# Patient Record
Sex: Female | Born: 2000
Health system: Southern US, Community
[De-identification: ages and names within clinical notes are randomized; demographics above are authoritative.]

## PROBLEM LIST (undated history)

## (undated) DIAGNOSIS — I10 Essential (primary) hypertension: Secondary | ICD-10-CM

## (undated) DIAGNOSIS — D649 Anemia, unspecified: Secondary | ICD-10-CM

## (undated) DIAGNOSIS — K760 Fatty (change of) liver, not elsewhere classified: Secondary | ICD-10-CM

## (undated) DIAGNOSIS — R7303 Prediabetes: Secondary | ICD-10-CM

## (undated) DIAGNOSIS — E282 Polycystic ovarian syndrome: Secondary | ICD-10-CM

## (undated) DIAGNOSIS — J302 Other seasonal allergic rhinitis: Secondary | ICD-10-CM

## (undated) DIAGNOSIS — J45909 Unspecified asthma, uncomplicated: Secondary | ICD-10-CM

## (undated) DIAGNOSIS — K219 Gastro-esophageal reflux disease without esophagitis: Secondary | ICD-10-CM

## (undated) DIAGNOSIS — E7800 Pure hypercholesterolemia, unspecified: Secondary | ICD-10-CM

## (undated) DIAGNOSIS — N939 Abnormal uterine and vaginal bleeding, unspecified: Secondary | ICD-10-CM

## (undated) DIAGNOSIS — L600 Ingrowing nail: Secondary | ICD-10-CM

## (undated) HISTORY — DX: Anemia, unspecified: D64.9

## (undated) HISTORY — DX: Prediabetes: R73.03

## (undated) HISTORY — DX: Essential (primary) hypertension: I10

## (undated) HISTORY — DX: Pure hypercholesterolemia, unspecified: E78.00

## (undated) HISTORY — DX: Fatty (change of) liver, not elsewhere classified: K76.0

## (undated) HISTORY — DX: Unspecified asthma, uncomplicated: J45.909

## (undated) HISTORY — DX: Abnormal uterine and vaginal bleeding, unspecified: N93.9

## (undated) HISTORY — DX: Polycystic ovarian syndrome: E28.2

## (undated) HISTORY — DX: Gastro-esophageal reflux disease without esophagitis: K21.9

---
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
## 2020-01-14 NOTE — Progress Notes (Addendum)
19 y.o. G0P0000 Single White or Caucasian female here for annual exam.  Working at [employer] right now.  She's thinking about looking for something else.  Pt is on OCPs for cycle control.  Flow lasts about 7 days.  She has heavy flow for two days.    She's noticed a rash in her groin.  She's used hydrocortisone cream and this make her feel a burning sensation.  She is now just using cornstarch.      Patient's last menstrual period was 12/23/2019 (exact date).          Sexually active: No. never The current method of family planning is OCP (estrogen/progesterone).    Exercising: Yes.    walking, bike, jumping jacks Smoker:  no  Health Maintenance: Pap:  none MMG: none TDaP:  2015 Screening Labs: not indicated today.  Is establishing care with PCP.  Has aged out of pediatricians office.     reports that she has never smoked. She has never used smokeless tobacco. She reports that she does not drink alcohol and does not use drugs.  Past Medical History:  Diagnosis Date  . Abnormal uterine bleeding   . Acid reflux   . Ingrown left big toenail 09/2016   recurrent  . PCOS (polycystic ovarian syndrome)   . Seasonal allergies     Past Surgical History:  Procedure Laterality Date  . LESION EXCISION Left 07/11/2014   Procedure: EXCISION OF BENIGN LESIONS INCLUDING MARGINS ;  Surgeon: Gerald Stabs, MD;  Location: [place];  Service: Pediatrics;  Laterality: Left;  mid back  and right neck  . TOENAIL EXCISION  01/27/2012   Procedure: MINOR TOENAIL EXCISION;  Surgeon: Jerilynn Mages. Gerald Stabs, MD;  Location: [place];  Service: Pediatrics;  Laterality: Left;  . TOENAIL EXCISION  03/23/2012   Procedure: MINOR TOENAIL EXCISION;  Surgeon: Jerilynn Mages. Gerald Stabs, MD;  Location: [place];  Service: Pediatrics;  Laterality: Right;  . TOENAIL EXCISION Right 09/21/2012   Procedure: Partial Excision of Right Toenail;  Surgeon: Jerilynn Mages. Gerald Stabs, MD;  Location:  [place];  Service: Pediatrics;  Laterality: Right;  . TOENAIL EXCISION Left 02/15/2013   Procedure: PARTIAL TOENAIL EXCISION ON THE LEFT BIG TOE AT THE OUTER LATERAL FOLD WITH PHENOL (MP ROOM) ;  Surgeon: Jerilynn Mages. Gerald Stabs, MD;  Location: [place];  Service: Pediatrics;  Laterality: Left;  . TOENAIL EXCISION Left 08/16/2013   Procedure: PARTIAL EXCISION OF LEFT BIG TOE NAIL FROM OUTER LATERAL FOLD;  Surgeon: Jerilynn Mages. Gerald Stabs, MD;  Location: [place];  Service: Pediatrics;  Laterality: Left;  . TOENAIL EXCISION Left 09/16/2016   Procedure: PARTIAL PERMANENT EXCISION OF LEFT INGROWN BIG TOENAIL;  Surgeon: Gerald Stabs, MD;  Location: [place];  Service: General;  Laterality: Left;  . WISDOM TOOTH EXTRACTION  12/08/2018   all 4    Current Outpatient Medications  Medication Sig Dispense Refill  . Acetaminophen-Caff-Pyrilamine (MIDOL MAX ST MENSTRUAL PO) Take by mouth.    Marland Kitchen BALZIVA 0.4-35 MG-MCG tablet Take 1 tablet by mouth daily. 84 tablet 4  . Carboxymeth-Glycerin-Polysorb (REFRESH DIGITAL OP) Apply to eye.    . cetirizine (ZYRTEC) 10 MG tablet Take 10 mg by mouth daily. Reported on 04/10/2015    . ibuprofen (ADVIL,MOTRIN) 200 MG tablet Take 200 mg by mouth every 6 (six) hours as needed.    . Olopatadine HCl (PATADAY OP) Apply to eye.    . Omega-3 Fatty  Acids (FISH OIL) 1000 MG CAPS Take by mouth daily.    . pantoprazole (PROTONIX) 40 MG tablet Take 40 mg by mouth daily.     No current facility-administered medications for this visit.    Family History  Problem Relation Age of Onset  . Asthma Mother   . Diabetes Father   . Hypertension Father   . Asthma Father   . Cirrhosis Paternal Grandmother        caused by medication  . Diabetes Paternal Grandmother   . Cervical cancer Maternal Grandmother     Review of Systems  Constitutional: Negative.   HENT: Negative.   Eyes: Negative.   Respiratory: Negative.    Cardiovascular: Negative.   Gastrointestinal: Negative.   Endocrine: Negative.   Genitourinary: Negative.   Musculoskeletal: Negative.   Skin: Negative.   Allergic/Immunologic: Negative.   Neurological: Negative.   Hematological: Negative.   Psychiatric/Behavioral: Negative.     Exam:   BP 114/68   Pulse 70   Resp 16   Ht 5' 6.75" (1.695 m)   Wt 294 lb (133.4 kg)   LMP 12/23/2019 (Exact Date)   BMI 46.39 kg/m   Height: 5' 6.75" (169.5 cm)  General appearance: alert, cooperative and appears stated age Head: Normocephalic, without obvious abnormality, atraumatic Neck: no adenopathy, supple, symmetrical, trachea midline and thyroid normal to inspection and palpation Lungs: clear to auscultation bilaterally Breasts: normal appearance, no masses or tenderness Heart: regular rate and rhythm Abdomen: soft, non-tender; bowel sounds normal; no masses,  no organomegaly Extremities: extremities normal, atraumatic, no cyanosis or edema Skin: Skin color, texture, turgor normal. No rashes or lesions Lymph nodes: Cervical, supraclavicular, and axillary nodes normal. No abnormal inguinal nodes palpated Neurologic: Grossly normal   Pelvic: External genitalia:  no lesions but erythematous skin changes in creases between labial and inner thigh c/w candida             No internal exam performed today  Chaperone, Terence Lux, CMA, was present for exam.  A:  Well Woman with normal exam H/o menorrhagia improved with OCPs H/o insulin resistance H/o elevated triglycerides 60 pound weight gain with BMI 46.4 PCOS  P:   Mammogram not indicated at this time.   pap smear not indicated RF ro Balziva to pharmacy for 3 month supply/4RF.  Requested no substitutions Lipids, HbA1C, TSH will be obtained today D/w pt Healthy Weight [facility] Wellness Return annually or prn

## 2020-01-17 ENCOUNTER — Other Ambulatory Visit: Payer: Self-pay

## 2020-01-17 ENCOUNTER — Encounter: Payer: Self-pay | Admitting: Obstetrics & Gynecology

## 2020-01-17 ENCOUNTER — Ambulatory Visit (INDEPENDENT_AMBULATORY_CARE_PROVIDER_SITE_OTHER): Payer: Managed Care, Other (non HMO) | Admitting: Obstetrics & Gynecology

## 2020-01-17 VITALS — BP 114/68 | HR 70 | Resp 16 | Ht 66.75 in | Wt 294.0 lb

## 2020-01-17 DIAGNOSIS — Z01419 Encounter for gynecological examination (general) (routine) without abnormal findings: Secondary | ICD-10-CM

## 2020-01-17 MED ORDER — BALZIVA 0.4-35 MG-MCG PO TABS: 1.0000 | ORAL_TABLET | Freq: Every day | ORAL | 4 refills | Status: DC

## 2020-01-17 NOTE — Patient Instructions (Signed)
Lamisil or Lotrimin over the counter cream

## 2020-01-17 NOTE — Addendum Note (Signed)
Addended by: Megan Salon on: 01/17/2020 10:58 AM   Modules accepted: Orders

## 2020-01-18 LAB — TSH: TSH: 2.18 u[IU]/mL (ref 0.450–4.500)

## 2020-01-18 LAB — LIPID PANEL
Chol/HDL Ratio: 5.5 ratio — ABNORMAL HIGH (ref 0.0–4.4)
Cholesterol, Total: 255 mg/dL — ABNORMAL HIGH (ref 100–169)
HDL: 46 mg/dL (ref >39)
LDL Chol Calc (NIH): 158 mg/dL — ABNORMAL HIGH (ref 0–109)
Triglycerides: 275 mg/dL — ABNORMAL HIGH (ref 0–89)
VLDL Cholesterol Cal: 51 mg/dL — ABNORMAL HIGH (ref 5–40)

## 2020-01-18 LAB — HEMOGLOBIN A1C
Est. average glucose Bld gHb Est-mCnc: 108 mg/dL
Hgb A1c MFr Bld: 5.4 % (ref 4.8–5.6)

## 2020-02-04 ENCOUNTER — Telehealth: Payer: Self-pay | Admitting: Family Medicine

## 2020-02-04 NOTE — Telephone Encounter (Signed)
error 

## 2020-04-14 ENCOUNTER — Other Ambulatory Visit: Payer: Self-pay

## 2020-04-14 ENCOUNTER — Encounter: Payer: Self-pay | Admitting: Obstetrics and Gynecology

## 2020-04-14 ENCOUNTER — Ambulatory Visit (INDEPENDENT_AMBULATORY_CARE_PROVIDER_SITE_OTHER): Payer: Managed Care, Other (non HMO) | Admitting: Obstetrics and Gynecology

## 2020-04-14 VITALS — BP 110/60 | HR 84 | Ht 66.75 in | Wt 304.0 lb

## 2020-04-14 DIAGNOSIS — N760 Acute vaginitis: Secondary | ICD-10-CM

## 2020-04-14 LAB — WET PREP FOR TRICH, YEAST, CLUE

## 2020-04-14 MED ORDER — BETAMETHASONE VALERATE 0.1 % EX OINT: TOPICAL_OINTMENT | CUTANEOUS | 0 refills | Status: DC

## 2020-04-14 NOTE — Patient Instructions (Signed)
Vaginitis  Vaginitis is a condition in which the vaginal tissue swells and becomes irritated. This condition is most often caused by a change in the normal balance of bacteria and yeast that live in the vagina. This change causes an overgrowth of certain bacteria or yeast, which causes the inflammation. There are different types of vaginitis. What are the causes? The cause of this condition depends on the type of vaginitis. It can be caused by:  Bacteria (bacterial vaginosis).  Yeast, which is a fungus (candidiasis).  A parasite (trichomoniasis vaginitis).  A virus (viral vaginitis).  Low hormone levels (atrophic vaginitis). Low hormone levels can occur during pregnancy, breastfeeding, or after menopause.  Irritants, such as bubble baths, scented tampons, and feminine sprays (allergic vaginitis). Other factors can change the normal balance of the yeast and bacteria that live in the vagina. These include:  Antibiotic medicines.  Poor hygiene.  Diaphragms, vaginal sponges, spermicides, birth control pills, and intrauterine devices (IUDs).  Sex.  Infection.  Uncontrolled diabetes.  A weakened body defense system (immune system). What increases the risk? This condition is more likely to develop in women who:  Smoke or are exposed to secondhand smoke.  Use vaginal douches, scented tampons, or scented sanitary pads.  Wear tight-fitting pants or thong underwear.  Use oral birth control pills or an IUD.  Have sex without a condom or have multiple partners.  Have an STI.  Frequently use the spermicide nonoxynol-9.  Eat lots of foods high in sugar or who have uncontrolled diabetes.  Have low estrogen levels.  Have a weakened immune system from an immune disorder or medical treatment.  Are pregnant or breastfeeding. What are the signs or symptoms? Symptoms vary depending on the cause of the vaginitis. Common symptoms include:  Abnormal vaginal discharge. ? The  discharge is white, gray, or yellow with bacterial vaginosis. ? The discharge is thick, white, and cheesy with a yeast infection. ? The discharge is frothy and yellow or greenish with trichomoniasis.  A bad vaginal smell. The smell is fishy with bacterial vaginosis.  Vaginal itching, pain, or swelling.  Pain with sex.  Pain or burning when urinating. Sometimes there are no symptoms. How is this diagnosed? This condition is diagnosed based on your symptoms and medical history. A physical exam, including a pelvic exam, will also be done. You may also have other tests, including:  Tests to determine the pH level (acidity or alkalinity) of your vagina.  A whiff test to assess the odor that results when a sample of your vaginal discharge is mixed with a potassium hydroxide solution.  Tests of vaginal fluid. A sample will be examined under a microscope. How is this treated? Treatment varies depending on the type of vaginitis you have. Your treatment may include:  Antibiotic creams or pills to treat bacterial vaginosis and trichomoniasis.  Antifungal medicines, such as vaginal creams or suppositories, to treat a yeast infection.  Medicine to ease discomfort if you have viral vaginitis. Your sexual partner should also be treated.  Estrogen delivered in a cream, pill, suppository, or vaginal ring to treat atrophic vaginitis. If vaginal dryness occurs, lubricants and moisturizing creams may help. You may need to avoid scented soaps, sprays, or douches.  Stopping use of a product that is causing allergic vaginitis and then using a vaginal cream to treat the symptoms. Follow these instructions at home: Lifestyle  Keep your genital area clean and dry. Avoid soap, and only rinse the area with water.  Do not douche  or use tampons until your health care provider says it is okay. Use sanitary pads, if needed.  Do not have sex until your health care provider approves. When you can return to sex,  practice safe sex and use condoms.  Wipe from front to back. This avoids the spread of bacteria from the rectum to the vagina. General instructions  Take over-the-counter and prescription medicines only as told by your health care provider.  If you were prescribed an antibiotic medicine, take or use it as told by your health care provider. Do not stop taking or using the antibiotic even if you start to feel better.  Keep all follow-up visits. This is important. How is this prevented?  Use mild, unscented products. Do not use things that can irritate the vagina, such as fabric softeners. Avoid the following products if they are scented: ? Feminine sprays. ? Detergents. ? Tampons. ? Feminine hygiene products. ? Soaps or bubble baths.  Let air reach your genital area. To do this: ? Wear cotton underwear to reduce moisture buildup. ? Avoid wearing underwear while you sleep. ? Avoid wearing tight pants and underwear or nylons without a cotton panel. ? Avoid wearing thong underwear.  Take off any wet clothing, such as bathing suits, as soon as possible.  Practice safe sex and use condoms. Contact a health care provider if:  You have abdominal or pelvic pain.  You have a fever or chills.  You have symptoms that last for more than 2-3 days. Get help right away if:  You have a fever and your symptoms suddenly get worse. Summary  Vaginitis is a condition in which the vaginal tissue becomes inflamed.This condition is most often caused by a change in the normal balance of bacteria and yeast that live in the vagina.  Treatment varies depending on the type of vaginitis you have.  Do not douche, use tampons, or have sex until your health care provider approves. When you can return to sex, practice safe sex and use condoms. This information is not intended to replace advice given to you by your health care provider. Make sure you discuss any questions you have with your health care  provider. Document Revised: 09/20/2019 Document Reviewed: 09/20/2019 Elsevier Patient Education  [place].

## 2020-04-14 NOTE — Progress Notes (Signed)
GYNECOLOGY  VISIT   HPI: 20 y.o.   Single White or Caucasian Not Hispanic or Latino  female   [id number] with Patient's last menstrual period was 04/13/2020.   here for a "yeast" infection. Patient c/o having itching and burning vaginally that started about 4 days ago. The itching is bad. She is on her cycle and it's heavy, it started yesterday. Never sexually active. On OCP's for cycle control.    GYNECOLOGIC HISTORY: Patient's last menstrual period was 04/13/2020. Contraception:OCP Menopausal hormone therapy: n/a        OB History    Gravida  0   Para  0   Term  0   Preterm  0   AB  0   Living  0     SAB  0   IAB  0   Ectopic  0   Multiple  0   Live Births  0              Patient Active Problem List   Diagnosis Date Noted  . PCOS (polycystic ovarian syndrome)   . Dysmetabolic syndrome 53/66/4403  . Increased BMI 07/17/2017  . Melanocytic nevus 07/17/2017  . Hypovitaminosis D 09/04/2015  . Hypertriglyceridemia 04/10/2015  . Acanthosis 04/10/2015  . Insulin resistance 04/10/2015  . Menorrhagia 04/10/2015  . CHICKENPOX, HX OF 08/03/2006    Past Medical History:  Diagnosis Date  . Abnormal uterine bleeding   . Acid reflux   . Ingrown left big toenail 09/2016   recurrent  . PCOS (polycystic ovarian syndrome)   . Seasonal allergies     Past Surgical History:  Procedure Laterality Date  . LESION EXCISION Left 07/11/2014   Procedure: EXCISION OF BENIGN LESIONS INCLUDING MARGINS ;  Surgeon: Gerald Stabs, MD;  Location: [place];  Service: Pediatrics;  Laterality: Left;  mid back  and right neck  . TOENAIL EXCISION  01/27/2012   Procedure: MINOR TOENAIL EXCISION;  Surgeon: Jerilynn Mages. Gerald Stabs, MD;  Location: [place];  Service: Pediatrics;  Laterality: Left;  . TOENAIL EXCISION  03/23/2012   Procedure: MINOR TOENAIL EXCISION;  Surgeon: Jerilynn Mages. Gerald Stabs, MD;  Location: [place];  Service: Pediatrics;   Laterality: Right;  . TOENAIL EXCISION Right 09/21/2012   Procedure: Partial Excision of Right Toenail;  Surgeon: Jerilynn Mages. Gerald Stabs, MD;  Location: [place];  Service: Pediatrics;  Laterality: Right;  . TOENAIL EXCISION Left 02/15/2013   Procedure: PARTIAL TOENAIL EXCISION ON THE LEFT BIG TOE AT THE OUTER LATERAL FOLD WITH PHENOL (MP ROOM) ;  Surgeon: Jerilynn Mages. Gerald Stabs, MD;  Location: [place];  Service: Pediatrics;  Laterality: Left;  . TOENAIL EXCISION Left 08/16/2013   Procedure: PARTIAL EXCISION OF LEFT BIG TOE NAIL FROM OUTER LATERAL FOLD;  Surgeon: Jerilynn Mages. Gerald Stabs, MD;  Location: [place];  Service: Pediatrics;  Laterality: Left;  . TOENAIL EXCISION Left 09/16/2016   Procedure: PARTIAL PERMANENT EXCISION OF LEFT INGROWN BIG TOENAIL;  Surgeon: Gerald Stabs, MD;  Location: [place];  Service: General;  Laterality: Left;  . WISDOM TOOTH EXTRACTION  12/08/2018   all 4    Current Outpatient Medications  Medication Sig Dispense Refill  . Acetaminophen-Caff-Pyrilamine (MIDOL MAX ST MENSTRUAL PO) Take by mouth.    Marland Kitchen aspirin-acetaminophen-caffeine (EXCEDRIN EXTRA STRENGTH) 250-250-65 MG tablet 2 tablets    . BALZIVA 0.4-35 MG-MCG tablet Take 1 tablet by mouth daily. 84 tablet 4  . Carboxymeth-Glycerin-Polysorb ([place] OP)  Apply to eye.    . cetirizine (ZYRTEC) 10 MG tablet Take 10 mg by mouth daily. Reported on 04/10/2015    . ibuprofen (ADVIL,MOTRIN) 200 MG tablet Take 200 mg by mouth every 6 (six) hours as needed.    . mupirocin ointment (BACTROBAN) 2 % Apply topically 3 (three) times daily.    . Olopatadine HCl (PATADAY OP) Apply to eye.    . Omega-3 Fatty Acids (FISH OIL) 1000 MG CAPS Take by mouth daily.    . pantoprazole (PROTONIX) 40 MG tablet Take 40 mg by mouth daily.     No current facility-administered medications for this visit.     ALLERGIES: Clindamycin/lincomycin  Family History  Problem  Relation Age of Onset  . Asthma Mother   . Diabetes Father   . Hypertension Father   . Asthma Father   . Cirrhosis Paternal Grandmother        caused by medication  . Diabetes Paternal Grandmother   . Cervical cancer Maternal Grandmother     Social History   Socioeconomic History  . Marital status: Single    Spouse name: Not on file  . Number of children: Not on file  . Years of education: Not on file  . Highest education level: Not on file  Occupational History  . Not on file  Tobacco Use  . Smoking status: Never Smoker  . Smokeless tobacco: Never Used  Vaping Use  . Vaping Use: Never used  Substance and Sexual Activity  . Alcohol use: No  . Drug use: No  . Sexual activity: Never    Birth control/protection: Abstinence, OCP  Other Topics Concern  . Not on file  Social History Narrative   Is in 9th grade homeschooled   Social Determinants of Health   Financial Resource Strain: Not on file  Food Insecurity: Not on file  Transportation Needs: Not on file  Physical Activity: Not on file  Stress: Not on file  Social Connections: Not on file  Intimate Partner Violence: Not on file    Review of Systems  Constitutional: Negative.   HENT: Negative.   Eyes: Negative.   Respiratory: Negative.   Cardiovascular: Negative.   Gastrointestinal: Negative.   Genitourinary:       Vaginal itching Vaginal burning  Musculoskeletal: Negative.   Skin: Negative.   Neurological: Negative.   Endo/Heme/Allergies: Negative.   Psychiatric/Behavioral: Negative.     PHYSICAL EXAMINATION:    BP 110/60 (BP Location: Right Arm, Patient Position: Sitting, Cuff Size: Large)   Pulse 84   Ht 5' 6.75" (1.695 m)   Wt (!) 304 lb (137.9 kg)   LMP 04/13/2020   BMI 47.97 kg/m     General appearance: alert, cooperative and appears stated age  Pelvic: External genitalia:  no lesions, erythematous              Urethra:  normal appearing urethra with no masses, tenderness or lesions               Bartholins and Skenes: normal                 Vagina: Swabs were placed in the vagina to collect samples (patient declined speculum exam).   Chaperone, Terence Lux, was present for exam.  Wet prep: no yeast, no clue, no trich, few WBC, few bacteria  ASSESSMENT Vulvovaginitis, negative wet prep    PLAN Will send swab for yeast Treat with steroid ointment  Discussed using Vaseline as needed

## 2020-04-18 LAB — C ALBICANS, DNA: C. albicans, DNA: NOT DETECTED

## 2020-04-24 ENCOUNTER — Encounter: Payer: Self-pay | Admitting: Family Medicine

## 2020-04-24 ENCOUNTER — Ambulatory Visit (INDEPENDENT_AMBULATORY_CARE_PROVIDER_SITE_OTHER): Payer: Managed Care, Other (non HMO) | Admitting: Family Medicine

## 2020-04-24 ENCOUNTER — Other Ambulatory Visit: Payer: Self-pay

## 2020-04-24 VITALS — BP 118/78 | HR 94 | Temp 98.3°F | Ht 67.0 in | Wt 307.6 lb

## 2020-04-24 DIAGNOSIS — E781 Pure hyperglyceridemia: Secondary | ICD-10-CM | POA: Diagnosis not present

## 2020-04-24 DIAGNOSIS — E282 Polycystic ovarian syndrome: Secondary | ICD-10-CM

## 2020-04-24 DIAGNOSIS — K219 Gastro-esophageal reflux disease without esophagitis: Secondary | ICD-10-CM | POA: Diagnosis not present

## 2020-04-24 DIAGNOSIS — Z7689 Persons encountering health services in other specified circumstances: Secondary | ICD-10-CM

## 2020-04-24 DIAGNOSIS — J302 Other seasonal allergic rhinitis: Secondary | ICD-10-CM

## 2020-04-24 NOTE — Progress Notes (Signed)
Patient presents to clinic today to establish care and f/u.  SUBJECTIVE: PMH: Pt is a 20 yo female with pmh sig for PCOS, HLD, increased BMI, who was previously seen at [facility]  Seasonal allergies: -symptoms when the seasons and weather changes -will take zyrtec  GERD: -symptoms with spicy foods -will take tums and protonix  Vision issues: -question of R optic nerve damage. -glasses  Past surgical history: Cholecystectomy 07/30/2018 Ingrown toenail removal Skin tag removal  Allergies: Clindamycin-shortness of breath  Social history: Patient currently working at [employer].  Patient denies alcohol, tobacco, drug use.  Patient is not sexually active.   Past Medical History:  Diagnosis Date  . Abnormal uterine bleeding   . Acid reflux   . Ingrown left big toenail 09/2016   recurrent  . PCOS (polycystic ovarian syndrome)   . Seasonal allergies     Past Surgical History:  Procedure Laterality Date  . LESION EXCISION Left 07/11/2014   Procedure: EXCISION OF BENIGN LESIONS INCLUDING MARGINS ;  Surgeon: Gerald Stabs, MD;  Location: [place];  Service: Pediatrics;  Laterality: Left;  mid back  and right neck  . TOENAIL EXCISION  01/27/2012   Procedure: MINOR TOENAIL EXCISION;  Surgeon: Jerilynn Mages. Gerald Stabs, MD;  Location: [place];  Service: Pediatrics;  Laterality: Left;  . TOENAIL EXCISION  03/23/2012   Procedure: MINOR TOENAIL EXCISION;  Surgeon: Jerilynn Mages. Gerald Stabs, MD;  Location: [place];  Service: Pediatrics;  Laterality: Right;  . TOENAIL EXCISION Right 09/21/2012   Procedure: Partial Excision of Right Toenail;  Surgeon: Jerilynn Mages. Gerald Stabs, MD;  Location: [place];  Service: Pediatrics;  Laterality: Right;  . TOENAIL EXCISION Left 02/15/2013   Procedure: PARTIAL TOENAIL EXCISION ON THE LEFT BIG TOE AT THE OUTER LATERAL FOLD WITH PHENOL (MP ROOM) ;  Surgeon: Jerilynn Mages. Gerald Stabs, MD;  Location:  [place];  Service: Pediatrics;  Laterality: Left;  . TOENAIL EXCISION Left 08/16/2013   Procedure: PARTIAL EXCISION OF LEFT BIG TOE NAIL FROM OUTER LATERAL FOLD;  Surgeon: Jerilynn Mages. Gerald Stabs, MD;  Location: [place];  Service: Pediatrics;  Laterality: Left;  . TOENAIL EXCISION Left 09/16/2016   Procedure: PARTIAL PERMANENT EXCISION OF LEFT INGROWN BIG TOENAIL;  Surgeon: Gerald Stabs, MD;  Location: [place];  Service: General;  Laterality: Left;  . WISDOM TOOTH EXTRACTION  12/08/2018   all 4    Current Outpatient Medications on File Prior to Visit  Medication Sig Dispense Refill  . Acetaminophen-Caff-Pyrilamine (MIDOL MAX ST MENSTRUAL PO) Take by mouth.    Marland Kitchen aspirin-acetaminophen-caffeine (EXCEDRIN EXTRA STRENGTH) 250-250-65 MG tablet 2 tablets    . BALZIVA 0.4-35 MG-MCG tablet Take 1 tablet by mouth daily. 84 tablet 4  . betamethasone valerate ointment (VALISONE) 0.1 % Apply a pea sized amount topically BID for up to 2 weeks as needed 30 g 0  . Carboxymeth-Glycerin-Polysorb (REFRESH DIGITAL OP) Apply to eye.    . cetirizine (ZYRTEC) 10 MG tablet Take 10 mg by mouth daily. Reported on 04/10/2015    . ibuprofen (ADVIL,MOTRIN) 200 MG tablet Take 200 mg by mouth every 6 (six) hours as needed.    . mupirocin ointment (BACTROBAN) 2 % Apply topically 2 (two) times daily as needed.    . Olopatadine HCl (PATADAY OP) Apply to eye.    . Omega-3 Fatty Acids (FISH OIL) 1000 MG CAPS Take by mouth daily.    . pantoprazole (PROTONIX) 40  MG tablet Take 40 mg by mouth daily.     No current facility-administered medications on file prior to visit.    Allergies  Allergen Reactions  . Clindamycin/Lincomycin Anaphylaxis    Trouble breathing?    Family History  Problem Relation Age of Onset  . Asthma Mother   . Diabetes Father   . Hypertension Father   . Asthma Father   . Cirrhosis Paternal Grandmother        caused by medication  . Diabetes  Paternal Grandmother   . Cervical cancer Maternal Grandmother     Social History   Socioeconomic History  . Marital status: Single    Spouse name: Not on file  . Number of children: Not on file  . Years of education: Not on file  . Highest education level: Not on file  Occupational History  . Not on file  Tobacco Use  . Smoking status: Never Smoker  . Smokeless tobacco: Never Used  Vaping Use  . Vaping Use: Never used  Substance and Sexual Activity  . Alcohol use: No  . Drug use: No  . Sexual activity: Never    Birth control/protection: Abstinence, OCP  Other Topics Concern  . Not on file  Social History Narrative   Is in 9th grade homeschooled   Social Determinants of Health   Financial Resource Strain: Not on file  Food Insecurity: Not on file  Transportation Needs: Not on file  Physical Activity: Not on file  Stress: Not on file  Social Connections: Not on file  Intimate Partner Violence: Not on file    ROS General: Denies fever, chills, night sweats, changes in weight, changes in appetite HEENT: Denies headaches, ear pain, changes in vision, rhinorrhea, sore throat CV: Denies CP, palpitations, SOB, orthopnea Pulm: Denies SOB, cough, wheezing GI: Denies abdominal pain, nausea, vomiting, diarrhea, constipation  + acid reflux GU: Denies dysuria, hematuria, frequency, vaginal discharge Msk: Denies muscle cramps, joint pains Neuro: Denies weakness, numbness, tingling Skin: Denies rashes, bruising Psych: Denies depression, anxiety, hallucinations   BP 118/78 (BP Location: Left Arm, Patient Position: Sitting, Cuff Size: Large)   Pulse 94   Temp 98.3 F (36.8 C) (Oral)   Ht 5\' 7"  (1.702 m)   Wt (!) 307 lb 9.6 oz (139.5 kg)   LMP 04/13/2020 (Exact Date)   SpO2 99%   BMI 48.18 kg/m   Physical Exam  Gen. Pleasant, well developed, well-nourished, in NAD HEENT - [place]/AT, PERRL, EOMI, conjunctive clear, no scleral icterus, no nasal drainage, pharynx without  erythema or exudate.  TMs normal b/l. Lungs: no use of accessory muscles, CTAB, no wheezes, rales or rhonchi Cardiovascular: RRR, No r/g/m, no peripheral edema Abdomen: BS present, soft, nontender,nondistended Musculoskeletal: No deformities, moves all four extremities, no cyanosis or clubbing, normal tone Neuro:  A&Ox3, CN II-XII intact, normal gait Skin:  Warm, dry, intact, no lesions Psych: normal affect, mood appropriate   Recent Results (from the past 2160 hour(s))  WET PREP FOR TRICH, YEAST, CLUE     Status: None   Collection Time: 04/14/20  8:34 AM   Specimen: Genital  Result Value Ref Range   Source: VAGINA    RESULT      Comment: EPITHELIAL CELLS-PRESENT CLUE CELLS-NONE SEEN YEAST-NONE SEEN TRICHOMONAS-NONE SEEN WBC-FEW BACTERIA-FEW EPITH. CELLS <6 HPF   Candida albicans, DNA     Status: None   Collection Time: 04/14/20  9:42 AM   Specimen: Genital  Result Value Ref Range   C. albicans, DNA  NOT DETECTED     Comment: . REFERENCE RANGE:       NOT DETECTED . Methodology: Real-Time PCR . This test was developed and its analytical performance characteristics have been determined by [employer]. It has not been cleared or approved by FDA. This assay has been validated pursuant to the CLIA regulations and is used for clinical purposes. .     Assessment/Plan: Seasonal allergies -stable -continue zyrtec prn -for continued symptoms consider switching to a different allergy med and trying floanse.  Gastroesophageal reflux disease, unspecified whether esophagitis present -avoid foods known to causes symptoms -continue tums and PPI as needed. -consider GI referral for worsened or continued sx  PCOS (polycystic ovarian syndrome) -lifestyle modifications -discussed wt loss. -obtain labs including thyroid studies, hgb A1C aft next OFV.  Pure hypertriglyceridemia -lifestyle modifications -will check lipids at next OFV  Encounter to establish care -We  reviewed the PMH, PSH, FH, SH, Meds and Allergies. -We provided refills for any medications we will prescribe as needed. -We addressed current concerns per orders and patient instructions. -We have asked for records for pertinent exams, studies, vaccines and notes from previous providers. -We have advised patient to follow up per instructions below.  F/u prn  Grier Mitts, MD

## 2020-04-24 NOTE — Patient Instructions (Addendum)
e Food Choices for Gastroesophageal Reflux Disease, Adult When you have gastroesophageal reflux disease (GERD), the foods you eat and your eating habits are very important. Choosing the right foods can help ease your discomfort. Think about working with a food expert (dietitian) to help you make good choices. What are tips for following this plan? Reading food labels  Look for foods that are low in saturated fat. Foods that may help with your symptoms include: ? Foods that have less than 5% of daily value (DV) of fat. ? Foods that have 0 grams of trans fat. Cooking  Do not fry your food.  Cook your food by baking, steaming, grilling, or broiling. These are all methods that do not need a lot of fat for cooking.  To add flavor, try to use herbs that are low in spice and acidity. Meal planning  Choose healthy foods that are low in fat, such as: ? Fruits and vegetables. ? Whole grains. ? Low-fat dairy products. ? Lean meats, fish, and poultry.  Eat small meals often instead of eating 3 large meals each day. Eat your meals slowly in a place where you are relaxed. Avoid bending over or lying down until 2-3 hours after eating.  Limit high-fat foods such as fatty meats or fried foods.  Limit your intake of fatty foods, such as oils, butter, and shortening.  Avoid the following as told by your doctor: ? Foods that cause symptoms. These may be different for different people. Keep a food diary to keep track of foods that cause symptoms. ? Alcohol. ? Drinking a lot of liquid with meals. ? Eating meals during the 2-3 hours before bed.   Lifestyle  Stay at a healthy weight. Ask your doctor what weight is healthy for you. If you need to lose weight, work with your doctor to do so safely.  Exercise for at least 30 minutes on 5 or more days each week, or as told by your doctor.  Wear loose-fitting clothes.  Do not smoke or use any products that contain nicotine or tobacco. If you need help  quitting, ask your doctor.  Sleep with the head of your bed higher than your feet. Use a wedge under the mattress or blocks under the bed frame to raise the head of the bed.  Chew sugar-free gum after meals. What foods should eat? Eat a healthy, well-balanced diet of fruits, vegetables, whole grains, low-fat dairy products, lean meats, fish, and poultry. Each person is different. Foods that may cause symptoms in one person may not cause any symptoms in another person. Work with your doctor to find foods that are safe for you. The items listed above may not be a complete list of what you can eat and drink. Contact a food expert for more options.   What foods should I avoid? Limiting some of these foods may help in managing the symptoms of GERD. Everyone is different. Talk with a food expert or your doctor to help you find the exact foods to avoid, if any. Fruits Any fruits prepared with added fat. Any fruits that cause symptoms. For some people, this may include citrus fruits, such as oranges, grapefruit, pineapple, and lemons. Vegetables Deep-fried vegetables. [place] fries. Any vegetables prepared with added fat. Any vegetables that cause symptoms. For some people, this may include tomatoes and tomato products, chili peppers, onions and garlic, and horseradish. Grains Pastries or quick breads with added fat. Meats and other proteins High-fat meats, such as fatty beef or  pork, hot dogs, ribs, ham, sausage, salami, and bacon. Fried meat or protein, including fried fish and fried chicken. Nuts and nut butters, in large amounts. Dairy Whole milk and chocolate milk. Sour cream. Cream. Ice cream. Cream cheese. Milkshakes. Fats and oils Butter. Margarine. Shortening. Ghee. Beverages Coffee and tea, with or without caffeine. Carbonated beverages. Sodas. Energy drinks. Fruit juice made with acidic fruits, such as orange or grapefruit. Tomato juice. Alcoholic drinks. Sweets and desserts Chocolate and  cocoa. Donuts. Seasonings and condiments Pepper. Peppermint and spearmint. Added salt. Any condiments, herbs, or seasonings that cause symptoms. For some people, this may include curry, hot sauce, or vinegar-based salad dressings. The items listed above may not be a complete list of what you should not eat and drink. Contact a food expert for more options. Questions to ask your doctor Diet and lifestyle changes are often the first steps that are taken to manage symptoms of GERD. If diet and lifestyle changes do not help, talk with your doctor about taking medicines. Where to find more information  [employer] Gastrointestinal Disorders: aboutgerd.org Summary  When you have GERD, food and lifestyle choices are very important in easing your symptoms.  Eat small meals often instead of 3 large meals a day. Eat your meals slowly and in a place where you are relaxed.  Avoid bending over or lying down until 2-3 hours after eating.  Limit high-fat foods such as fatty meats or fried foods. This information is not intended to replace advice given to you by your health care provider. Make sure you discuss any questions you have with your health care provider. Document Revised: 10/01/2019 Document Reviewed: 10/01/2019 Elsevier Patient Education  2021 [place].  Gastroesophageal Reflux Disease, Adult  Gastroesophageal reflux (GER) happens when acid from the stomach flows up into the tube that connects the mouth and the stomach (esophagus). Normally, food travels down the esophagus and stays in the stomach to be digested. With GER, food and stomach acid sometimes move back up into the esophagus. You may have a disease called gastroesophageal reflux disease (GERD) if the reflux:  Happens often.  Causes frequent or very bad symptoms.  Causes problems such as damage to the esophagus. When this happens, the esophagus becomes sore and swollen. Over time, GERD can make small holes  (ulcers) in the lining of the esophagus. What are the causes? This condition is caused by a problem with the muscle between the esophagus and the stomach. When this muscle is weak or not normal, it does not close properly to keep food and acid from coming back up from the stomach. The muscle can be weak because of:  Tobacco use.  Pregnancy.  Having a certain type of hernia (hiatal hernia).  Alcohol use.  Certain foods and drinks, such as coffee, chocolate, onions, and peppermint. What increases the risk?  Being overweight.  Having a disease that affects your connective tissue.  Taking NSAIDs, such a ibuprofen. What are the signs or symptoms?  Heartburn.  Difficult or painful swallowing.  The feeling of having a lump in the throat.  A bitter taste in the mouth.  Bad breath.  Having a lot of saliva.  Having an upset or bloated stomach.  Burping.  Chest pain. Different conditions can cause chest pain. Make sure you see your doctor if you have chest pain.  Shortness of breath or wheezing.  A long-term cough or a cough at night.  Wearing away of the surface of teeth (tooth enamel).  Weight loss. How is this treated?  Making changes to your diet.  Taking medicine.  Having surgery. Treatment will depend on how bad your symptoms are. Follow these instructions at home: Eating and drinking  Follow a diet as told by your doctor. You may need to avoid foods and drinks such as: ? Coffee and tea, with or without caffeine. ? Drinks that contain alcohol. ? Energy drinks and sports drinks. ? Bubbly (carbonated) drinks or sodas. ? Chocolate and cocoa. ? Peppermint and mint flavorings. ? Garlic and onions. ? Horseradish. ? Spicy and acidic foods. These include peppers, chili powder, curry powder, vinegar, hot sauces, and BBQ sauce. ? Citrus fruit juices and citrus fruits, such as oranges, lemons, and limes. ? Tomato-based foods. These include red sauce, chili,  salsa, and pizza with red sauce. ? Fried and fatty foods. These include donuts, french fries, potato chips, and high-fat dressings. ? High-fat meats. These include hot dogs, rib eye steak, sausage, ham, and bacon. ? High-fat dairy items, such as whole milk, butter, and cream cheese.  Eat small meals often. Avoid eating large meals.  Avoid drinking large amounts of liquid with your meals.  Avoid eating meals during the 2-3 hours before bedtime.  Avoid lying down right after you eat.  Do not exercise right after you eat.   Lifestyle  Do not smoke or use any products that contain nicotine or tobacco. If you need help quitting, ask your doctor.  Try to lower your stress. If you need help doing this, ask your doctor.  If you are overweight, lose an amount of weight that is healthy for you. Ask your doctor about a safe weight loss goal.   General instructions  Pay attention to any changes in your symptoms.  Take over-the-counter and prescription medicines only as told by your doctor.  Do not take aspirin, ibuprofen, or other NSAIDs unless your doctor says it is okay.  Wear loose clothes. Do not wear anything tight around your waist.  Raise (elevate) the head of your bed about 6 inches (15 cm). You may need to use a wedge to do this.  Avoid bending over if this makes your symptoms worse.  Keep all follow-up visits. Contact a doctor if:  You have new symptoms.  You lose weight and you do not know why.  You have trouble swallowing or it hurts to swallow.  You have wheezing or a cough that keeps happening.  You have a hoarse voice.  Your symptoms do not get better with treatment. Get help right away if:  You have sudden pain in your arms, neck, jaw, teeth, or back.  You suddenly feel sweaty, dizzy, or light-headed.  You have chest pain or shortness of breath.  You vomit and the vomit is green, yellow, or black, or it looks like blood or coffee grounds.  You  faint.  Your poop (stool) is red, bloody, or black.  You cannot swallow, drink, or eat. These symptoms may represent a serious problem that is an emergency. Do not wait to see if the symptoms will go away. Get medical help right away. Call your local emergency services (911 in the [place] Do not drive yourself to the hospital. Summary  If a person has gastroesophageal reflux disease (GERD), food and stomach acid move back up into the esophagus and cause symptoms or problems such as damage to the esophagus.  Treatment will depend on how bad your symptoms are.  Follow a diet as told by your  doctor.  Take all medicines only as told by your doctor. This information is not intended to replace advice given to you by your health care provider. Make sure you discuss any questions you have with your health care provider. Document Revised: 10/01/2019 Document Reviewed: 10/01/2019 Elsevier Patient Education  [place].  Diet for Polycystic Ovary Syndrome Polycystic ovary syndrome (PCOS) is a common hormonal disorder that affects a woman's reproductive system. It can cause problems with menstrual periods and make it hard to get and stay pregnant. Changing what you eat can help your hormones reach normal levels, improve your health, and help you better manage PCOS. Following a balanced diet can help you lose weight and improve the way that your body uses the hormone insulin to control blood sugar. This may include:  Eating low-fat (lean) proteins, complex carbohydrates, fresh fruits and vegetables, low-fat dairy products, healthy fats, and fiber.  Cutting down on calories.  Exercising regularly. What are tips for following this plan?  Follow a balanced diet for meals and snacks. Eat breakfast, lunch, dinner, and one or two snacks every day.  Include protein in each meal and snack.  Choose whole grains instead of products that are made with refined flour.  Eat a variety of  foods.  Exercise regularly as told by your health care provider. Aim to do at least 30 minutes of exercise on most days of the week.  If you are overweight or obese: ? Pay attention to how many calories you eat. Cutting down on calories can help you lose weight. ? Work with your health care provider or a dietitian to figure out how many calories you need each day. What foods should I eat? Fruits Include a variety of colors and types. All fruits are helpful for PCOS. Vegetables Include a variety of colors and types. All vegetables are helpful for PCOS. Grains Whole grains, such as whole wheat. Whole-grain breads, crackers, cereals, and pasta. Unsweetened oatmeal. Bulgur, barley, quinoa, and brown rice. Tortillas made from corn or whole-wheat flour. Meats and other proteins Lean proteins, such as fish, chicken, beans, eggs, and tofu. Dairy Low-fat dairy products, such as skim milk, cheese sticks, and yogurt. Beverages Low-fat or fat-free drinks, such as water, low-fat milk, sugar-free drinks, and small amounts of 100% fruit juice. Seasonings and condiments Ketchup. Mustard. Barbecue sauce. Relish. Low-fat or fat-free mayonnaise. Fats and oils Olive oil or canola oil. Walnuts and almonds. The items listed above may not be a complete list of recommended foods and beverages. Contact a dietitian for more options.   What foods should I avoid? Foods that are high in calories or fat, especially saturated or trans fats. Fried foods. Sweets. Products that are made from refined white flour, including white bread, pastries, white rice, and pasta. The items listed above may not be a complete list of foods and beverages to avoid. Contact a dietitian for more information. Summary  PCOS is a hormonal imbalance that affects a woman's reproductive system. It can cause problems with menstrual periods and make it hard to get and stay pregnant.  You can help to manage your PCOS by exercising regularly and  eating a healthy, varied diet of vegetables, fruit, whole grains, lean protein, and low-fat dairy products.  Changing what you eat can improve the way that your body uses insulin, help your hormones reach normal levels, and help you lose weight. This information is not intended to replace advice given to you by your health care provider. Make sure  you discuss any questions you have with your health care provider. Document Revised: 08/30/2019 Document Reviewed: 08/30/2019 Elsevier Patient Education  2021 [place].  Allergic Rhinitis, Adult Allergic rhinitis is a reaction to allergens. Allergens are things that can cause an allergic reaction. This condition affects the lining inside the nose (mucous membrane). There are two types of allergic rhinitis:  Seasonal. This type is also called hay fever. It happens only during some times of the year.  Perennial. This type can happen at any time of the year. This condition cannot be spread from person to person (is not contagious). It can be mild, worse, or very bad. It can develop at any age and may be outgrown. What are the causes? This condition may be caused by:  Pollen from grasses, trees, and weeds.  Dust mites.  Smoke.  Mold.  Car fumes.  The pee (urine), spit, or dander of pets. Dander is dead skin cells from a pet.   What increases the risk? You are more likely to develop this condition if:  You have allergies in your family.  You have problems like allergies in your family. You may have: ? Swelling of parts of your eyes and eyelids. ? Asthma. This affects how you breathe. ? Long-term redness and swelling on your skin. ? Food allergies. What are the signs or symptoms? The main symptom of this condition is a runny or stuffy nose (nasal congestion). Other symptoms may include:  Sneezing or coughing.  Itching and tearing of your eyes.  Mucus that drips down the back of your throat (postnasal drip).  Trouble  sleeping.  Feeling tired.  Headache.  Sore throat. How is this treated? There is no cure for this condition. You should avoid things that you are allergic to. Treatment can help to relieve symptoms. This may include:  Medicines that block allergy symptoms, such as corticosteroids or antihistamines. These may be given as a shot, nasal spray, or pill.  Avoiding things you are allergic to.  Medicines that give you bits of what you are allergic to over time. This is called immunotherapy. It is done if other treatments do not help. You may get: ? Shots. ? Medicine under your tongue.  Stronger medicines, if other treatments do not help. Follow these instructions at home: Avoiding allergens Find out what things you are allergic to and avoid them. To do this, try these things:  If you get allergies any time of year: ? Replace carpet with wood, tile, or vinyl flooring. Carpet can trap pet dander and dust. ? Do not smoke. Do not allow smoking in your home. ? Change your heating and air conditioning filters at least once a month.  If you get allergies only some times of the year: ? Keep windows closed when you can. ? Plan things to do outside when pollen counts are lowest. Check pollen counts before you plan things to do outside. ? When you come indoors, change your clothes and shower before you sit on furniture or bedding.   If you are allergic to a pet: ? Keep the pet out of your bedroom. ? Vacuum, sweep, and dust often.   General instructions  Take over-the-counter and prescription medicines only as told by your doctor.  Drink enough fluid to keep your pee (urine) pale yellow.  Keep all follow-up visits as told by your doctor. This is important. Where to find more information  American Academy of Allergy, Asthma & Immunology: www.aaaai.org Contact a doctor if:  You have a fever.  You get a cough that does not go away.  You make whistling sounds when you breathe  (wheeze).  Your symptoms slow you down.  Your symptoms stop you from doing your normal things each day. Get help right away if:  You are short of breath. This symptom may be an emergency. Do not wait to see if the symptom will go away. Get medical help right away. Call your local emergency services (911 in the [place] Do not drive yourself to the hospital. Summary  Allergic rhinitis may be treated by taking medicines and avoiding things you are allergic to.  If you have allergies only some of the year, keep windows closed when you can at those times.  Contact your doctor if you get a fever or a cough that does not go away. This information is not intended to replace advice given to you by your health care provider. Make sure you discuss any questions you have with your health care provider. Document Revised: 05/14/2019 Document Reviewed: 03/20/2019 Elsevier Patient Education  2021 [employer].

## 2020-05-05 ENCOUNTER — Encounter: Payer: Self-pay | Admitting: Family Medicine

## 2020-05-05 ENCOUNTER — Telehealth (INDEPENDENT_AMBULATORY_CARE_PROVIDER_SITE_OTHER): Payer: Managed Care, Other (non HMO) | Admitting: Family Medicine

## 2020-05-05 DIAGNOSIS — J302 Other seasonal allergic rhinitis: Secondary | ICD-10-CM

## 2020-05-05 DIAGNOSIS — J4520 Mild intermittent asthma, uncomplicated: Secondary | ICD-10-CM | POA: Diagnosis not present

## 2020-05-05 MED ORDER — ALBUTEROL SULFATE HFA 108 (90 BASE) MCG/ACT IN AERS: 2.0000 | INHALATION_SPRAY | Freq: Four times a day (QID) | RESPIRATORY_TRACT | 0 refills | Status: DC | PRN

## 2020-05-05 NOTE — Progress Notes (Signed)
Virtual Visit via Video Note  I connected with Terri Cook on 05/05/20 at  9:30 AM EST by a video enabled telemedicine application 2/2 [id number] pandemic and verified that I am speaking with the correct person using two identifiers.  Location patient: home Location provider:work or home office Persons participating in the virtual visit: patient, provider  I discussed the limitations of evaluation and management by telemedicine and the availability of in person appointments. The patient expressed understanding and agreed to proceed.   HPI: Pt is a 20 yo with pmh sig for GERD, PCOS, allergies who presents with acute concern.  Pt with wheezing, SOB, cough, chest tightness starting on Saturday.  Pt used her brother's albuterol inhaler which helped.  At sx onset pt was cleaning (making up her bed and cleaning closet).  Pt denies prior h/o asthma sx in the past, but notes her mom and brother have asthma.  Pt denies HA, fever, ear pain or pressure, sore throat, rhinorrhea.   Pt taking zyrtec for allergies but not sure its helping.  Pt started taking her mom's allergy medicine, OTC chloratabs, the other day.  Pt states she is about to start a new job in a place that is dusty.    ROS: See pertinent positives and negatives per HPI.  Past Medical History:  Diagnosis Date  . Abnormal uterine bleeding   . Acid reflux   . Ingrown left big toenail 09/2016   recurrent  . PCOS (polycystic ovarian syndrome)   . Seasonal allergies     Past Surgical History:  Procedure Laterality Date  . LESION EXCISION Left 07/11/2014   Procedure: EXCISION OF BENIGN LESIONS INCLUDING MARGINS ;  Surgeon: Gerald Stabs, MD;  Location: [place];  Service: Pediatrics;  Laterality: Left;  mid back  and right neck  . TOENAIL EXCISION  01/27/2012   Procedure: MINOR TOENAIL EXCISION;  Surgeon: Jerilynn Mages. Gerald Stabs, MD;  Location: [place];  Service: Pediatrics;  Laterality: Left;  . TOENAIL  EXCISION  03/23/2012   Procedure: MINOR TOENAIL EXCISION;  Surgeon: Jerilynn Mages. Gerald Stabs, MD;  Location: [place];  Service: Pediatrics;  Laterality: Right;  . TOENAIL EXCISION Right 09/21/2012   Procedure: Partial Excision of Right Toenail;  Surgeon: Jerilynn Mages. Gerald Stabs, MD;  Location: [place];  Service: Pediatrics;  Laterality: Right;  . TOENAIL EXCISION Left 02/15/2013   Procedure: PARTIAL TOENAIL EXCISION ON THE LEFT BIG TOE AT THE OUTER LATERAL FOLD WITH PHENOL (MP ROOM) ;  Surgeon: Jerilynn Mages. Gerald Stabs, MD;  Location: [place];  Service: Pediatrics;  Laterality: Left;  . TOENAIL EXCISION Left 08/16/2013   Procedure: PARTIAL EXCISION OF LEFT BIG TOE NAIL FROM OUTER LATERAL FOLD;  Surgeon: Jerilynn Mages. Gerald Stabs, MD;  Location: [place];  Service: Pediatrics;  Laterality: Left;  . TOENAIL EXCISION Left 09/16/2016   Procedure: PARTIAL PERMANENT EXCISION OF LEFT INGROWN BIG TOENAIL;  Surgeon: Gerald Stabs, MD;  Location: [place];  Service: General;  Laterality: Left;  . WISDOM TOOTH EXTRACTION  12/08/2018   all 4    Family History  Problem Relation Age of Onset  . Asthma Mother   . Diabetes Father   . Hypertension Father   . Asthma Father   . Cirrhosis Paternal Grandmother        caused by medication  . Diabetes Paternal Grandmother   . Cervical cancer Maternal Grandmother     Current Outpatient Medications:  .  Acetaminophen-Caff-Pyrilamine (  MIDOL MAX ST MENSTRUAL PO), Take by mouth., Disp: , Rfl:  .  aspirin-acetaminophen-caffeine (EXCEDRIN EXTRA STRENGTH) 250-250-65 MG tablet, 2 tablets, Disp: , Rfl:  .  BALZIVA 0.4-35 MG-MCG tablet, Take 1 tablet by mouth daily., Disp: 84 tablet, Rfl: 4 .  betamethasone valerate ointment (VALISONE) 0.1 %, Apply a pea sized amount topically BID for up to 2 weeks as needed, Disp: 30 g, Rfl: 0 .  Carboxymeth-Glycerin-Polysorb (REFRESH DIGITAL OP), Apply to eye., Disp: ,  Rfl:  .  cetirizine (ZYRTEC) 10 MG tablet, Take 10 mg by mouth daily. Reported on 04/10/2015, Disp: , Rfl:  .  ibuprofen (ADVIL,MOTRIN) 200 MG tablet, Take 200 mg by mouth every 6 (six) hours as needed., Disp: , Rfl:  .  mupirocin ointment (BACTROBAN) 2 %, Apply topically 2 (two) times daily as needed., Disp: , Rfl:  .  Olopatadine HCl (PATADAY OP), Apply to eye., Disp: , Rfl:  .  Omega-3 Fatty Acids (FISH OIL) 1000 MG CAPS, Take by mouth daily., Disp: , Rfl:  .  pantoprazole (PROTONIX) 40 MG tablet, Take 40 mg by mouth daily., Disp: , Rfl:   EXAM:  VITALS per patient if applicable: RR between 42-70 bpm  GENERAL: alert, oriented, appears well and in no acute distress  HEENT: atraumatic, conjunctiva clear, no obvious abnormalities on inspection of external nose and ears  NECK: normal movements of the head and neck  LUNGS: on inspection no signs of respiratory distress, breathing rate appears normal, no obvious gross SOB, gasping or wheezing  CV: no obvious cyanosis  MS: moves all visible extremities without noticeable abnormality  PSYCH/NEURO: pleasant and cooperative, no obvious depression or anxiety, speech and thought processing grossly intact  ASSESSMENT AND PLAN:  Discussed the following assessment and plan:  Mild intermittent reactive airway disease without complication  -Symptoms possible 2/2 exertion or dust, also consider allergies . -family h/o asthma. -will place referral to pulm for PFTs -albuterol inhaler prn.  Reviewed proper use. -zyretec d/c'd.  Will continue chlortab. - Plan: Ambulatory referral to Pulmonology, albuterol (VENTOLIN HFA) 108 (90 Base) MCG/ACT inhaler  Seasonal allergies -d/c zyrtec as no longer effective -continue chlortab (chlorphentermine) as needed -continue to monitor  F/u prn  I discussed the assessment and treatment plan with the patient. The patient was provided an opportunity to ask questions and all were answered. The patient agreed  with the plan and demonstrated an understanding of the instructions.   The patient was advised to call back or seek an in-person evaluation if the symptoms worsen or if the condition fails to improve as anticipated.   Billie Ruddy, MD

## 2020-05-20 ENCOUNTER — Other Ambulatory Visit: Payer: Self-pay

## 2020-05-20 ENCOUNTER — Ambulatory Visit (INDEPENDENT_AMBULATORY_CARE_PROVIDER_SITE_OTHER): Payer: Managed Care, Other (non HMO) | Admitting: Pulmonary Disease

## 2020-05-20 ENCOUNTER — Encounter: Payer: Self-pay | Admitting: Pulmonary Disease

## 2020-05-20 ENCOUNTER — Telehealth: Payer: Self-pay | Admitting: Pulmonary Disease

## 2020-05-20 VITALS — BP 128/84 | HR 96 | Temp 98.4°F | Ht 68.0 in | Wt 315.0 lb

## 2020-05-20 DIAGNOSIS — J4530 Mild persistent asthma, uncomplicated: Secondary | ICD-10-CM

## 2020-05-20 MED ORDER — ADVAIR HFA 115-21 MCG/ACT IN AERO: 2.0000 | INHALATION_SPRAY | Freq: Two times a day (BID) | RESPIRATORY_TRACT | 12 refills | Status: DC

## 2020-05-20 NOTE — Telephone Encounter (Signed)
Pt's mother called office in regards to Advair needing a prior auth. Stated to her since pt has not tried any other inhalers other than being on the albuterol, insurance would deny the prior auth for Advair if attempted.  Stated to [place] that they need to contact insurance to have them provide them a formulary list of covered inhalers and once they had that info to call us back so we could then provide this info to Dr. Loanne Drilling. [name] verbalized understanding. Will await a call back from pt's mother.

## 2020-05-20 NOTE — Progress Notes (Signed)
Subjective:   PATIENT ID: Terri Cook GENDER: female DOB: 11/22/2000, MRN: [id number]   HPI  Chief Complaint  Patient presents with  . Consult    05/08/2020- Sob with exertion comes with pain and chest tightness. Having some wheezing.     Reason for Visit: New consult for shortness of breath and chest tightness.   Ms. Terri Cook is a 20 year old female never smoker with no history of childhood asthma who presents with shortness of breath and wheezing.  Two weeks ago she began having gradually worsening shortness of breath with exertion associated chest tightness, wheezing and nonproductive coughing. Episodes will occur activity such as climbing stairs and at rest at times. Symptoms improved after using albuterol daily up to twice a day. Symptoms seem occur at night with similar severity. She reports chronic allergies manifested as watery eyes, rhinorrhea, scratchy throat which she takes zyrtec and chloratab, which she feels helps. Allergic to dust. Note by her PCP Dr. Volanda Napoleon on 05/06/19 was reviewed. Diagnosed with asthma. Referred to Pulmonary for further evaluation. Her brother and both parents have asthma. Denies recent illness or recent sick contacts. Denies exacerbations requiring steroids.  Social History: Never smoker  ACT: No flowsheet data found.  Environmental exposures:  None Outdoor/indoor dog, rottweiler/blue heeler mix, shedding  I have personally reviewed patient's past medical/family/social history, allergies, current medications.  Past Medical History:  Diagnosis Date  . Abnormal uterine bleeding   . Acid reflux   . Ingrown left big toenail 09/2016   recurrent  . PCOS (polycystic ovarian syndrome)   . Seasonal allergies      Family History  Problem Relation Age of Onset  . Asthma Mother   . Diabetes Father   . Hypertension Father   . Asthma Father   . Cirrhosis Paternal Grandmother        caused by medication  . Diabetes Paternal Grandmother   .  Cervical cancer Maternal Grandmother      Social History   Occupational History  . Not on file  Tobacco Use  . Smoking status: Never Smoker  . Smokeless tobacco: Never Used  Vaping Use  . Vaping Use: Never used  Substance and Sexual Activity  . Alcohol use: No  . Drug use: No  . Sexual activity: Never    Birth control/protection: Abstinence, OCP    Allergies  Allergen Reactions  . Clindamycin/Lincomycin Anaphylaxis    Trouble breathing?     Outpatient Medications Prior to Visit  Medication Sig Dispense Refill  . Acetaminophen-Caff-Pyrilamine (MIDOL MAX ST MENSTRUAL PO) Take by mouth.    Marland Kitchen albuterol (VENTOLIN HFA) 108 (90 Base) MCG/ACT inhaler Inhale 2 puffs into the lungs every 6 (six) hours as needed for wheezing or shortness of breath. 8 g 0  . aspirin-acetaminophen-caffeine (EXCEDRIN EXTRA STRENGTH) 250-250-65 MG tablet 2 tablets    . BALZIVA 0.4-35 MG-MCG tablet Take 1 tablet by mouth daily. 84 tablet 4  . Carboxymeth-Glycerin-Polysorb (REFRESH DIGITAL OP) Apply to eye.    . cetirizine (ZYRTEC) 10 MG tablet Take 10 mg by mouth daily. Reported on 04/10/2015    . ibuprofen (ADVIL,MOTRIN) 200 MG tablet Take 200 mg by mouth every 6 (six) hours as needed.    . Olopatadine HCl (PATADAY OP) Apply to eye.    . Omega-3 Fatty Acids (FISH OIL) 1000 MG CAPS Take by mouth daily.    . pantoprazole (PROTONIX) 40 MG tablet Take 40 mg by mouth daily.    . betamethasone valerate  ointment (VALISONE) 0.1 % Apply a pea sized amount topically BID for up to 2 weeks as needed (Patient not taking: Reported on 05/20/2020) 30 g 0  . mupirocin ointment (BACTROBAN) 2 % Apply topically 2 (two) times daily as needed. (Patient not taking: Reported on 05/20/2020)     No facility-administered medications prior to visit.    Review of Systems  Constitutional: Negative for chills, diaphoresis, fever, malaise/fatigue and weight loss.  HENT: Positive for congestion. Negative for ear pain and sore throat.    Respiratory: Positive for cough, shortness of breath and wheezing. Negative for hemoptysis and sputum production.   Cardiovascular: Negative for chest pain, palpitations and leg swelling.  Gastrointestinal: Negative for abdominal pain, heartburn and nausea.  Genitourinary: Negative for frequency.  Musculoskeletal: Negative for joint pain and myalgias.  Skin: Negative for itching and rash.  Neurological: Negative for dizziness, weakness and headaches.  Endo/Heme/Allergies: Positive for environmental allergies. Does not bruise/bleed easily.  Psychiatric/Behavioral: Negative for depression. The patient is not nervous/anxious.      Objective:   Vitals:   05/20/20 1326  BP: 128/84  Pulse: 96  Temp: 98.4 F (36.9 C)  SpO2: 99%  Weight: (!) 315 lb (142.9 kg)  Height: 5' 8"  (1.727 m)   SpO2: 99 % O2 Device: None (Room air)  Physical Exam: General: Well-appearing, no acute distress HENT: [place], AT Eyes: EOMI, no scleral icterus Respiratory: Clear to auscultation bilaterally.  No crackles, wheezing or rales Cardiovascular: RRR, -M/R/G, no JVD Extremities:-Edema,-tenderness Neuro: AAO x4, CNII-XII grossly intact Psych: Normal mood, normal affect  Data Reviewed:  Imaging: None on file  PFT: None on file  Labs: CBC    Component Value Date/Time   WBC 6.1 09/28/2017 1543   WBC 7.2 08/28/2015 0832   RBC 5.11 09/28/2017 1543   RBC 5.41 (H) 08/28/2015 0832   HGB 14.8 09/28/2017 1543   HCT 42.6 09/28/2017 1543   PLT 384 09/28/2017 1543   MCV 83 09/28/2017 1543   MCH 29.0 09/28/2017 1543   MCH 26.8 08/28/2015 0832   MCHC 34.7 09/28/2017 1543   MCHC 33.5 08/28/2015 0832   RDW 12.9 09/28/2017 1543   LYMPHSABS 2,232 08/28/2015 0832   MONOABS 432 08/28/2015 0832   EOSABS 288 08/28/2015 0832   BASOSABS 144 08/28/2015 0832  Absolute Eos 08/28/15 - 288  Imaging, labs and test noted above have been reviewed independently by me.    Assessment & Plan:   Discussion: 20 year  old female never smoker with no childhood history of asthma who presents with symptoms consistent with clinical asthma. Recommend starting ICS/LABA. We discussed the diagnosis and management of asthma including maintenance inhalers and asthma action plan.  Mild persistent asthma --START Advair 115-21 mcg TWO puffs TWICE a day. This is your EVERYDAY inhaler  --CONTINUE Albuterol AS NEEDED for shortness of breath or wheezing. This is your RESCUE inhaler --We will arrange for pulmonary function test to evaluate your severity of your asthma. This will be scheduled before your next visit  Asthma Action Plan Increase Advair to 2 puffs three times a day for worsening shortness of breath, wheezing and cough. If you symptoms do not improve in 24-48 hours, please our office for evaluation and/or prednisone taper.  Health Maintenance Immunization History  Administered Date(s) Administered  . DTaP 02/08/2001, 05/17/2001, 06/29/2001, 04/25/2002, 10/12/2005  . HPV 9-valent 12/19/2014  . HPV Quadrivalent 09/07/2013  . Hepatitis A, Ped/Adol-2 Dose 03/10/2010, 09/07/2013  . Hepatitis B, ped/adol Jan 08, 2001, 02/08/2001, 06/29/2001  . HiB (  PRP-T) 02/08/2001, 05/17/2001, 06/29/2001, 04/25/2002  . IPV 02/08/2001, 05/17/2001, 02/11/2002, 10/12/2005  . Influenza Split 01/11/2002, 02/13/2002, 02/25/2003  . MMR 01/11/2002, 10/12/2005  . Meningococcal Conjugate 09/07/2013  . Pneumococcal-Unspecified 02/08/2001, 05/17/2001, 06/29/2001, 04/25/2002  . Tdap 09/07/2013  . Varicella 01/11/2002, 10/12/2005   CT Lung Screen - not indicated  No orders of the defined types were placed in this encounter.  Meds ordered this encounter  Medications  . fluticasone-salmeterol (ADVAIR HFA) 115-21 MCG/ACT inhaler    Sig: Inhale 2 puffs into the lungs 2 (two) times daily.    Dispense:  1 each    Refill:  12    Return in about 3 months (around 08/17/2020).  I have spent a total time of 45-minutes on the day of the  appointment reviewing prior documentation, coordinating care and discussing medical diagnosis and plan with the patient/family. Imaging, labs and tests included in this note have been reviewed and interpreted independently by me.  [place], MD [facility] 05/20/2020 1:32 PM  Office Number [phone]

## 2020-05-20 NOTE — Patient Instructions (Signed)
Mild persistent asthma --START Advair 115-21 mcg TWO puffs TWICE a day. This is your EVERYDAY inhaler  --CONTINUE Albuterol AS NEEDED for shortness of breath or wheezing. This is your RESCUE inhaler --We will arrange for pulmonary function test to evaluate your severity of your asthma. This will be scheduled before your next visit  Asthma Action Plan Increase Advair to 2 puffs three times a day for worsening shortness of breath, wheezing and cough. If you symptoms do not improve in 24-48 hours, please our office for evaluation and/or prednisone taper.  Follow-up with me in 3 months

## 2020-05-22 NOTE — Telephone Encounter (Signed)
Pt's mother calling back and stated that insurance said prescription needs to say Advair diskus brand but substitution allowed. Pt's mother can be reached at [phone]

## 2020-05-22 NOTE — Telephone Encounter (Signed)
Called pt's pharmacy and spoke with Terri Cook in regards to the Advair Rx that we sent in stating that it needs to be brand name as that is what is covered by insurance. Terri Cook was able to run the inhaler through with brand name and stated it was going to be no charge.  He stated that pt would be notified once Rx was ready.  Called and spoke with pt's mother Terri Cook letting her know this info and she verbalized understanding. Nothing further needed.

## 2020-05-27 ENCOUNTER — Telehealth: Payer: Self-pay | Admitting: Pulmonary Disease

## 2020-05-27 NOTE — Telephone Encounter (Signed)
Forwarding to Dr. Loanne Drilling, she is at [facility]. today

## 2020-05-27 NOTE — Telephone Encounter (Signed)
I have called and LM on VM for the pt to call back about her medication not working.

## 2020-05-27 NOTE — Telephone Encounter (Signed)
Patient is returning phone call. Patient phone number is [phone].

## 2020-05-27 NOTE — Telephone Encounter (Signed)
Call returned to patient, confirmed DOB. She reports she was recently here and diagnosed with asthma. She reports she has been using the inhalers and she feels like her SOB has actually gotten worse since using the inhalers. She confirms that she is using her advair and albuterol as instructed. Denies active chest pain. She reports the albuterol does give her relief but only for about 30 minutes. She is requesting something stronger. I made her aware I would get this information to provider and we would get back with her. I did let her know it has only been 5 days so she may need to give it a little more time to be effective.   EW please advise. Patient requesting something stronger for SOB. Thanks :)

## 2020-05-28 MED ORDER — ADVAIR HFA 230-21 MCG/ACT IN AERO: 2.0000 | INHALATION_SPRAY | Freq: Two times a day (BID) | RESPIRATORY_TRACT | 12 refills | Status: DC

## 2020-05-28 NOTE — Telephone Encounter (Signed)
Returned call to discuss inhalers. No answer. Left message with callback number to the office. Please reach out to me via epic secure chat if patient calls office on 05/28/20.

## 2020-05-28 NOTE — Telephone Encounter (Signed)
Discussed patient's symptoms including persistent wheezing and shortness of breath with activity despite rescue inhaler use twice a day daily. Not in active exacerbation. Will increase maintenance inhaler.  Plan --Discontinue Advair HFA 115-21 mcg TWO puffs BID. Can use this inhaler as rescue inhaler until it runs out. --START Advair HFA 230-21 mcg TWO puffs BID

## 2020-06-04 ENCOUNTER — Encounter: Payer: Self-pay | Admitting: Family Medicine

## 2020-06-04 ENCOUNTER — Telehealth (INDEPENDENT_AMBULATORY_CARE_PROVIDER_SITE_OTHER): Payer: Managed Care, Other (non HMO) | Admitting: Family Medicine

## 2020-06-04 DIAGNOSIS — L304 Erythema intertrigo: Secondary | ICD-10-CM

## 2020-06-04 MED ORDER — NYSTATIN 100000 UNIT/GM EX CREA: 1.0000 "application " | TOPICAL_CREAM | Freq: Two times a day (BID) | CUTANEOUS | 0 refills | Status: DC

## 2020-06-04 MED ORDER — FLUCONAZOLE 150 MG PO TABS: 150.0000 mg | ORAL_TABLET | Freq: Once | ORAL | 0 refills | Status: AC

## 2020-06-04 NOTE — Patient Instructions (Signed)
Intertrigo Intertrigo is skin irritation or inflammation (dermatitis) that occurs when folds of skin rub together. The irritation can cause a rash and make skin raw and itchy. This condition most commonly occurs in the skin folds of these areas:  Toes.  Armpits.  Groin.  Under the belly.  Under the breasts.  Buttocks. Intertrigo is not passed from person to person (is not contagious). What are the causes? This condition is caused by heat, moisture, rubbing (friction), and not enough air circulation. The condition can be made worse by:  Sweat.  Bacteria.  A fungus, such as yeast. What increases the risk? This condition is more likely to occur if you have moisture in your skin folds. You are more likely to develop this condition if you:  Have diabetes.  Are overweight.  Are not able to move around or are not active.  Live in a warm and moist climate.  Wear splints, braces, or other medical devices.  Are not able to control your bowels or bladder (have incontinence). What are the signs or symptoms? Symptoms of this condition include:  A pink or red skin rash in the skin fold or near the skin fold.  Raw or scaly skin.  Itchiness.  A burning feeling.  Bleeding.  Leaking fluid.  A bad smell. How is this diagnosed? This condition is diagnosed with a medical history and physical exam. You may also have a skin swab to test for bacteria or a fungus. How is this treated? This condition may be treated by:  Cleaning and drying your skin.  Taking an antibiotic medicine or using an antibiotic skin cream for a bacterial infection.  Using an antifungal cream on your skin or taking pills for an infection that was caused by a fungus, such as yeast.  Using a steroid ointment to relieve itchiness and irritation.  Separating the skin fold with a clean cotton cloth to absorb moisture and allow air to flow into the area. Follow these instructions at home:  Keep the  affected area clean and dry.  Do not scratch your skin.  Stay in a cool environment as much as possible. Use an air conditioner or fan, if available.  Apply over-the-counter and prescription medicines only as told by your health care provider.  If you were prescribed an antibiotic medicine, use it as told by your health care provider. Do not stop using the antibiotic even if your condition improves.  Keep all follow-up visits as told by your health care provider. This is important. How is this prevented?  Maintain a healthy weight.  Take care of your feet, especially if you have diabetes. Foot care includes: ? Wearing shoes that fit well. ? Keeping your feet dry. ? Wearing clean, breathable socks.  Protect the skin around your groin and buttocks, especially if you have incontinence. Skin protection includes: ? Following a regular cleaning routine. ? Using skin protectant creams, powders, or ointments. ? Changing protection pads frequently.  Do not wear tight clothes. Wear clothes that are loose, absorbent, and made of cotton.  Wear a bra that gives good support, if needed.  Shower and dry yourself well after activity or exercise. Use a hair dryer on a cool setting to dry between skin folds, especially after you bathe.  If you have diabetes, keep your blood sugar under control.   Contact a health care provider if:  Your symptoms do not improve with treatment.  Your symptoms get worse or they spread.  You notice increased redness   and warmth.  You have a fever. Summary  Intertrigo is skin irritation or inflammation (dermatitis) that occurs when folds of skin rub together.  This condition is caused by heat, moisture, rubbing (friction), and not enough air circulation.  This condition may be treated by cleaning and drying your skin and with medicines.  Apply over-the-counter and prescription medicines only as told by your health care provider.  Keep all follow-up visits  as told by your health care provider. This is important. This information is not intended to replace advice given to you by your health care provider. Make sure you discuss any questions you have with your health care provider. Document Revised: 08/22/2017 Document Reviewed: 08/22/2017 Elsevier Patient Education  2021 [employer]  

## 2020-06-04 NOTE — Progress Notes (Signed)
Virtual Visit via Video Note  I connected with Terri Cook on 06/04/20 at  4:30 PM EST by a video enabled telemedicine application 2/2 [id number] pandemic and verified that I am speaking with the correct person using two identifiers.  Location patient: home Location provider:work or home office Persons participating in the virtual visit: patient, provider  I discussed the limitations of evaluation and management by telemedicine and the availability of in person appointments. The patient expressed understanding and agreed to proceed.   HPI: Pt with a red rash under breast and thighs x 1 wk. Tried cortisone cream and corn starch.  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  Endorses increased sweating while at work.     ROS: See pertinent positives and negatives per HPI.  Past Medical History:  Diagnosis Date  . Abnormal uterine bleeding   . Acid reflux   . High blood pressure   . High cholesterol   . Ingrown left big toenail 09/2016   recurrent  . PCOS (polycystic ovarian syndrome)   . Seasonal allergies     Past Surgical History:  Procedure Laterality Date  . GALLBLADDER SURGERY  2021  . LESION EXCISION Left 07/11/2014   Procedure: EXCISION OF BENIGN LESIONS INCLUDING MARGINS ;  Surgeon: Gerald Stabs, MD;  Location: [place];  Service: Pediatrics;  Laterality: Left;  mid back  and right neck  . TOENAIL EXCISION  01/27/2012   Procedure: MINOR TOENAIL EXCISION;  Surgeon: Jerilynn Mages. Gerald Stabs, MD;  Location: [place];  Service: Pediatrics;  Laterality: Left;  . TOENAIL EXCISION  03/23/2012   Procedure: MINOR TOENAIL EXCISION;  Surgeon: Jerilynn Mages. Gerald Stabs, MD;  Location: [place];  Service: Pediatrics;  Laterality: Right;  . TOENAIL EXCISION Right 09/21/2012   Procedure: Partial Excision of Right Toenail;  Surgeon: Jerilynn Mages. Gerald Stabs, MD;  Location: [place];  Service: Pediatrics;  Laterality: Right;  . TOENAIL EXCISION Left  02/15/2013   Procedure: PARTIAL TOENAIL EXCISION ON THE LEFT BIG TOE AT THE OUTER LATERAL FOLD WITH PHENOL (MP ROOM) ;  Surgeon: Jerilynn Mages. Gerald Stabs, MD;  Location: [place];  Service: Pediatrics;  Laterality: Left;  . TOENAIL EXCISION Left 08/16/2013   Procedure: PARTIAL EXCISION OF LEFT BIG TOE NAIL FROM OUTER LATERAL FOLD;  Surgeon: Jerilynn Mages. Gerald Stabs, MD;  Location: [place];  Service: Pediatrics;  Laterality: Left;  . TOENAIL EXCISION Left 09/16/2016   Procedure: PARTIAL PERMANENT EXCISION OF LEFT INGROWN BIG TOENAIL;  Surgeon: Gerald Stabs, MD;  Location: [place];  Service: General;  Laterality: Left;  . WISDOM TOOTH EXTRACTION  12/08/2018   all 4    Family History  Problem Relation Age of Onset  . Asthma Mother   . Diabetes Father   . Hypertension Father   . Asthma Father   . Cirrhosis Paternal Grandmother        caused by medication  . Diabetes Paternal Grandmother   . Cervical cancer Maternal Grandmother      Current Outpatient Medications:  .  Acetaminophen-Caff-Pyrilamine (MIDOL MAX ST MENSTRUAL PO), Take by mouth., Disp: , Rfl:  .  albuterol (VENTOLIN HFA) 108 (90 Base) MCG/ACT inhaler, Inhale 2 puffs into the lungs every 6 (six) hours as needed for wheezing or shortness of breath., Disp: 8 g, Rfl: 0 .  aspirin-acetaminophen-caffeine (EXCEDRIN EXTRA STRENGTH) 250-250-65 MG tablet, 2 tablets, Disp: , Rfl:  .  BALZIVA 0.4-35 MG-MCG tablet, Take 1 tablet by mouth  daily., Disp: 84 tablet, Rfl: 4 .  betamethasone valerate ointment (VALISONE) 0.1 %, Apply a pea sized amount topically BID for up to 2 weeks as needed (Patient not taking: Reported on 05/20/2020), Disp: 30 g, Rfl: 0 .  Carboxymeth-Glycerin-Polysorb (REFRESH DIGITAL OP), Apply to eye., Disp: , Rfl:  .  cetirizine (ZYRTEC) 10 MG tablet, Take 10 mg by mouth daily. Reported on 04/10/2015, Disp: , Rfl:  .  fluticasone-salmeterol (ADVAIR HFA) 230-21 MCG/ACT inhaler, Inhale  2 puffs into the lungs 2 (two) times daily., Disp: 1 each, Rfl: 12 .  ibuprofen (ADVIL,MOTRIN) 200 MG tablet, Take 200 mg by mouth every 6 (six) hours as needed., Disp: , Rfl:  .  mupirocin ointment (BACTROBAN) 2 %, Apply topically 2 (two) times daily as needed. (Patient not taking: Reported on 05/20/2020), Disp: , Rfl:  .  Olopatadine HCl (PATADAY OP), Apply to eye., Disp: , Rfl:  .  Omega-3 Fatty Acids (FISH OIL) 1000 MG CAPS, Take by mouth daily., Disp: , Rfl:  .  pantoprazole (PROTONIX) 40 MG tablet, Take 40 mg by mouth daily., Disp: , Rfl:   EXAM:    VITALS per patient if applicable:  RR between 12-20 bpm  GENERAL: alert, oriented, appears well and in no acute distress  HEENT: atraumatic, conjunctiva clear, no obvious abnormalities on inspection of external nose and ears  NECK: normal movements of the head and neck  LUNGS: on inspection no signs of respiratory distress, breathing rate appears normal, no obvious gross SOB, gasping or wheezing  SKIN:  Erythematous plaques under b/l breast with satellite lesions noted on perimeter of areas  CV: no obvious cyanosis  MS: moves all visible extremities without noticeable abnormality  PSYCH/NEURO: pleasant and cooperative, no obvious depression or anxiety, speech and thought processing grossly intact  ASSESSMENT AND PLAN:  Discussed the following assessment and plan:  Intertrigo -Discussed supportive care including wearing clothes that are breathable - Plan: nystatin cream (MYCOSTATIN), fluconazole (DIFLUCAN) 150 MG tablet  Follow-up as needed for worsening or continued symptoms   I discussed the assessment and treatment plan with the patient. The patient was provided an opportunity to ask questions and all were answered. The patient agreed with the plan and demonstrated an understanding of the instructions.   The patient was advised to call back or seek an in-person evaluation if the symptoms worsen or if the condition fails to  improve as anticipated.   Billie Ruddy, MD

## 2020-06-05 ENCOUNTER — Encounter: Payer: Self-pay | Admitting: Pulmonary Disease

## 2020-06-06 ENCOUNTER — Telehealth: Payer: Self-pay | Admitting: Pulmonary Disease

## 2020-06-06 NOTE — Telephone Encounter (Signed)
I called the pharmacy and they stated that the advair 230 is ready to be picked up and has been there for 3 days and there is no charge.  I called pt to advise her of this and she stated that each time she goes to pick this up they tell her it needs a PA and she is not able to get it.  Nothing further is needed.

## 2020-08-13 ENCOUNTER — Other Ambulatory Visit: Payer: Self-pay

## 2020-08-14 ENCOUNTER — Encounter: Payer: Self-pay | Admitting: Family Medicine

## 2020-08-14 ENCOUNTER — Ambulatory Visit (INDEPENDENT_AMBULATORY_CARE_PROVIDER_SITE_OTHER): Payer: Managed Care, Other (non HMO) | Admitting: Family Medicine

## 2020-08-14 VITALS — BP 104/82 | HR 70 | Temp 97.4°F | Wt 296.6 lb

## 2020-08-14 DIAGNOSIS — R635 Abnormal weight gain: Secondary | ICD-10-CM | POA: Diagnosis not present

## 2020-08-14 DIAGNOSIS — R1012 Left upper quadrant pain: Secondary | ICD-10-CM | POA: Diagnosis not present

## 2020-08-14 DIAGNOSIS — K219 Gastro-esophageal reflux disease without esophagitis: Secondary | ICD-10-CM | POA: Diagnosis not present

## 2020-08-14 MED ORDER — OMEPRAZOLE 40 MG PO CPDR: 40.0000 mg | DELAYED_RELEASE_CAPSULE | Freq: Two times a day (BID) | ORAL | 3 refills | Status: DC

## 2020-08-14 NOTE — Patient Instructions (Signed)
Food Choices for Gastroesophageal Reflux Disease, Adult When you have gastroesophageal reflux disease (GERD), the foods you eat and your eating habits are very important. Choosing the right foods can help ease your discomfort. Think about working with a food expert (dietitian) to help you make good choices. What are tips for following this plan? Reading food labels  Look for foods that are low in saturated fat. Foods that may help with your symptoms include: ? Foods that have less than 5% of daily value (DV) of fat. ? Foods that have 0 grams of trans fat. Cooking  Do not fry your food.  Cook your food by baking, steaming, grilling, or broiling. These are all methods that do not need a lot of fat for cooking.  To add flavor, try to use herbs that are low in spice and acidity. Meal planning  Choose healthy foods that are low in fat, such as: ? Fruits and vegetables. ? Whole grains. ? Low-fat dairy products. ? Lean meats, fish, and poultry.  Eat small meals often instead of eating 3 large meals each day. Eat your meals slowly in a place where you are relaxed. Avoid bending over or lying down until 2-3 hours after eating.  Limit high-fat foods such as fatty meats or fried foods.  Limit your intake of fatty foods, such as oils, butter, and shortening.  Avoid the following as told by your doctor: ? Foods that cause symptoms. These may be different for different people. Keep a food diary to keep track of foods that cause symptoms. ? Alcohol. ? Drinking a lot of liquid with meals. ? Eating meals during the 2-3 hours before bed.   Lifestyle  Stay at a healthy weight. Ask your doctor what weight is healthy for you. If you need to lose weight, work with your doctor to do so safely.  Exercise for at least 30 minutes on 5 or more days each week, or as told by your doctor.  Wear loose-fitting clothes.  Do not smoke or use any products that contain nicotine or tobacco. If you need help  quitting, ask your doctor.  Sleep with the head of your bed higher than your feet. Use a wedge under the mattress or blocks under the bed frame to raise the head of the bed.  Chew sugar-free gum after meals. What foods should eat? Eat a healthy, well-balanced diet of fruits, vegetables, whole grains, low-fat dairy products, lean meats, fish, and poultry. Each person is different. Foods that may cause symptoms in one person may not cause any symptoms in another person. Work with your doctor to find foods that are safe for you. The items listed above may not be a complete list of what you can eat and drink. Contact a food expert for more options.   What foods should I avoid? Limiting some of these foods may help in managing the symptoms of GERD. Everyone is different. Talk with a food expert or your doctor to help you find the exact foods to avoid, if any. Fruits Any fruits prepared with added fat. Any fruits that cause symptoms. For some people, this may include citrus fruits, such as oranges, grapefruit, pineapple, and lemons. Vegetables Deep-fried vegetables. French fries. Any vegetables prepared with added fat. Any vegetables that cause symptoms. For some people, this may include tomatoes and tomato products, chili peppers, onions and garlic, and horseradish. Grains Pastries or quick breads with added fat. Meats and other proteins High-fat meats, such as fatty beef or pork,   hot dogs, ribs, ham, sausage, salami, and bacon. Fried meat or protein, including fried fish and fried chicken. Nuts and nut butters, in large amounts. Dairy Whole milk and chocolate milk. Sour cream. Cream. Ice cream. Cream cheese. Milkshakes. Fats and oils Butter. Margarine. Shortening. Ghee. Beverages Coffee and tea, with or without caffeine. Carbonated beverages. Sodas. Energy drinks. Fruit juice made with acidic fruits, such as orange or grapefruit. Tomato juice. Alcoholic drinks. Sweets and desserts Chocolate and  cocoa. Donuts. Seasonings and condiments Pepper. Peppermint and spearmint. Added salt. Any condiments, herbs, or seasonings that cause symptoms. For some people, this may include curry, hot sauce, or vinegar-based salad dressings. The items listed above may not be a complete list of what you should not eat and drink. Contact a food expert for more options. Questions to ask your doctor Diet and lifestyle changes are often the first steps that are taken to manage symptoms of GERD. If diet and lifestyle changes do not help, talk with your doctor about taking medicines. Where to find more information  International Foundation for Gastrointestinal Disorders: aboutgerd.org Summary  When you have GERD, food and lifestyle choices are very important in easing your symptoms.  Eat small meals often instead of 3 large meals a day. Eat your meals slowly and in a place where you are relaxed.  Avoid bending over or lying down until 2-3 hours after eating.  Limit high-fat foods such as fatty meats or fried foods. This information is not intended to replace advice given to you by your health care provider. Make sure you discuss any questions you have with your health care provider. Document Revised: 10/01/2019 Document Reviewed: 10/01/2019 Elsevier Patient Education  2021 [place].  Abdominal Pain, Adult Pain in the abdomen (abdominal pain) can be caused by many things. Often, abdominal pain is not serious and it gets better with no treatment or by being treated at home. However, sometimes abdominal pain is serious. Your health care provider will ask questions about your medical history and do a physical exam to try to determine the cause of your abdominal pain. Follow these instructions at home: Medicines  Take over-the-counter and prescription medicines only as told by your health care provider.  Do not take a laxative unless told by your health care provider. General instructions  Watch your  condition for any changes.  Drink enough fluid to keep your urine pale yellow.  Keep all follow-up visits as told by your health care provider. This is important.   Contact a health care provider if:  Your abdominal pain changes or gets worse.  You are not hungry or you lose weight without trying.  You are constipated or have diarrhea for more than 2-3 days.  You have pain when you urinate or have a bowel movement.  Your abdominal pain wakes you up at night.  Your pain gets worse with meals, after eating, or with certain foods.  You are vomiting and cannot keep anything down.  You have a fever.  You have blood in your urine. Get help right away if:  Your pain does not go away as soon as your health care provider told you to expect.  You cannot stop vomiting.  Your pain is only in areas of the abdomen, such as the right side or the left lower portion of the abdomen. Pain on the right side could be caused by appendicitis.  You have bloody or black stools, or stools that look like tar.  You have severe pain,  cramping, or bloating in your abdomen.  You have signs of dehydration, such as: ? Dark urine, very little urine, or no urine. ? Cracked lips. ? Dry mouth. ? Sunken eyes. ? Sleepiness. ? Weakness.  You have trouble breathing or chest pain. Summary  Often, abdominal pain is not serious and it gets better with no treatment or by being treated at home. However, sometimes abdominal pain is serious.  Watch your condition for any changes.  Take over-the-counter and prescription medicines only as told by your health care provider.  Contact a health care provider if your abdominal pain changes or gets worse.  Get help right away if you have severe pain, cramping, or bloating in your abdomen. This information is not intended to replace advice given to you by your health care provider. Make sure you discuss any questions you have with your health care provider. Document  Revised: 05/11/2019 Document Reviewed: 07/31/2018 Elsevier Patient Education  [place].

## 2020-08-15 LAB — CBC WITH DIFFERENTIAL/PLATELET
Basophils Absolute: 0.1 10*3/uL (ref 0.0–0.1)
Basophils Relative: 1.3 % (ref 0.0–3.0)
Eosinophils Absolute: 0.3 10*3/uL (ref 0.0–0.7)
Eosinophils Relative: 3 % (ref 0.0–5.0)
HCT: 38.6 % (ref 36.0–49.0)
Hemoglobin: 12.7 g/dL (ref 12.0–16.0)
Lymphocytes Relative: 30.4 % (ref 24.0–48.0)
Lymphs Abs: 2.8 10*3/uL (ref 0.7–4.0)
MCHC: 32.9 g/dL (ref 31.0–37.0)
MCV: 71 fl — ABNORMAL LOW (ref 78.0–98.0)
Monocytes Absolute: 0.6 10*3/uL (ref 0.1–1.0)
Monocytes Relative: 6.4 % (ref 3.0–12.0)
Neutro Abs: 5.5 10*3/uL (ref 1.4–7.7)
Neutrophils Relative %: 58.9 % (ref 43.0–71.0)
Platelets: 391 10*3/uL (ref 150.0–575.0)
RBC: 5.44 Mil/uL (ref 3.80–5.70)
RDW: 14.8 % (ref 11.4–15.5)
WBC: 9.3 10*3/uL (ref 4.5–13.5)

## 2020-08-15 LAB — COMPREHENSIVE METABOLIC PANEL
ALT: 60 U/L — ABNORMAL HIGH (ref 0–35)
AST: 29 U/L (ref 0–37)
Albumin: 4.2 g/dL (ref 3.5–5.2)
Alkaline Phosphatase: 77 U/L (ref 47–119)
BUN: 9 mg/dL (ref 6–23)
CO2: 27 mEq/L (ref 19–32)
Calcium: 9.5 mg/dL (ref 8.4–10.5)
Chloride: 105 mEq/L (ref 96–112)
Creatinine, Ser: 0.81 mg/dL (ref 0.40–1.20)
GFR: 105.08 mL/min (ref >60.00)
Glucose, Bld: 123 mg/dL — ABNORMAL HIGH (ref 70–99)
Potassium: 3.7 mEq/L (ref 3.5–5.1)
Sodium: 143 mEq/L (ref 135–145)
Total Bilirubin: 1.2 mg/dL (ref 0.2–1.2)
Total Protein: 6.5 g/dL (ref 6.0–8.3)

## 2020-08-15 LAB — LIPASE: Lipase: 23 U/L (ref 11.0–59.0)

## 2020-08-15 LAB — T4, FREE: Free T4: 0.84 ng/dL (ref 0.60–1.60)

## 2020-08-15 LAB — HEMOGLOBIN A1C: Hgb A1c MFr Bld: 5.7 % (ref 4.6–6.5)

## 2020-08-15 LAB — TSH: TSH: 1.43 u[IU]/mL (ref 0.40–5.00)

## 2020-08-25 ENCOUNTER — Telehealth: Payer: Self-pay | Admitting: Family Medicine

## 2020-08-25 NOTE — Telephone Encounter (Signed)
Patient is calling and stated that provider wanted her to do labs but there are no orders in the system, please advise. CB is [phone]

## 2020-08-26 NOTE — Progress Notes (Signed)
ATC patient, no answer, left detailed vm per FYI.

## 2020-08-27 ENCOUNTER — Telehealth: Payer: Self-pay | Admitting: Family Medicine

## 2020-08-27 ENCOUNTER — Encounter: Payer: Self-pay | Admitting: Family Medicine

## 2020-08-27 ENCOUNTER — Other Ambulatory Visit: Payer: Self-pay | Admitting: Family Medicine

## 2020-08-27 DIAGNOSIS — R7989 Other specified abnormal findings of blood chemistry: Secondary | ICD-10-CM

## 2020-08-27 NOTE — Progress Notes (Signed)
Subjective:    Patient ID: Terri Cook, female    DOB: Jun 20, 2000, 20 y.o.   MRN: [id number]  Chief Complaint  Patient presents with  . Abdominal Pain    Staes cannot hold food down, will eat and then a few hours later she has to vomit all she ate. Tried tums nad pepto tabs, they did not help, also tried tonic water. Started a few days ago    HPI Patient was seen today for the above mentioned acute concerns.  Pt with constant abd pain, unable to make an association with food and pain.  Having difficulty keeping food down.  Eats, then hours later emesis.  Tried tums, pepto bismal, and tonic water without relief.  Denies acid taste in mouth or overt burning in chest.  Patient also notes weight gain.  Weight 260 lbs 2 weeks ago, now up to 296 lbs.  Past Medical History:  Diagnosis Date  . Abnormal uterine bleeding   . Acid reflux   . High blood pressure   . High cholesterol   . Ingrown left big toenail 09/2016   recurrent  . PCOS (polycystic ovarian syndrome)   . Seasonal allergies     Allergies  Allergen Reactions  . Clindamycin/Lincomycin Anaphylaxis    Trouble breathing?    ROS General: Denies fever, chills, night sweats, changes in appetite  + weight gain HEENT: Denies headaches, ear pain, changes in vision, rhinorrhea, sore throat CV: Denies CP, palpitations, SOB, orthopnea Pulm: Denies SOB, cough, wheezing GI: Denies diarrhea, constipation + abdominal pain, nausea, emesis GU: Denies dysuria, hematuria, frequency, vaginal discharge Msk: Denies muscle cramps, joint pains Neuro: Denies weakness, numbness, tingling Skin: Denies rashes, bruising Psych: Denies depression, anxiety, hallucinations     Objective:    Blood pressure 104/82, pulse 70, temperature (!) 97.4 F (36.3 C), temperature source Oral, weight 296 lb 9.6 oz (134.5 kg), SpO2 99 %.   Gen. Pleasant, well-nourished, obese, in no distress, normal affect   HEENT: [place]/AT, face symmetric, conjunctiva clear, no  scleral icterus, PERRLA, EOMI, nares patent without drainage, pharynx without erythema or exudate. Neck: No JVD, no thyromegaly Lungs: no accessory muscle use, CTAB, no wheezes or rales Cardiovascular: RRR, no m/r/g, no peripheral edema Abdomen: BS present, soft, TTP of LUQ, nondistended, no hepatosplenomegaly. Musculoskeletal: No deformities, no cyanosis or clubbing, normal tone Neuro:  A&Ox3, CN II-XII intact, normal gait Skin:  Warm, no lesions/ rash   Wt Readings from Last 3 Encounters:  08/14/20 296 lb 9.6 oz (134.5 kg) (>99 %, Z= 2.82)*  05/20/20 (!) 315 lb (142.9 kg) (>99 %, Z= 2.89)*  04/24/20 (!) 307 lb 9.6 oz (139.5 kg) (>99 %, Z= 2.85)*   * Growth percentiles are based on CDC (Girls, 2-20 Years) data.    Lab Results  Component Value Date   WBC 9.3 08/14/2020   HGB 12.7 08/14/2020   HCT 38.6 08/14/2020   PLT 391.0 08/14/2020   GLUCOSE 123 (H) 08/14/2020   CHOL 255 (H) 01/17/2020   TRIG 275 (H) 01/17/2020   HDL 46 01/17/2020   LDLCALC 158 (H) 01/17/2020   ALT 60 (H) 08/14/2020   AST 29 08/14/2020   NA 143 08/14/2020   K 3.7 08/14/2020   CL 105 08/14/2020   CREATININE 0.81 08/14/2020   BUN 9 08/14/2020   CO2 27 08/14/2020   TSH 1.43 08/14/2020   HGBA1C 5.7 08/14/2020    Assessment/Plan:  LUQ abdominal pain  -Discussed possible causes including gastritis, ulcer, H. pylori -Will  obtain labs -Discussed bland diet and eating small portions as tolerated -If needed based on labs will place referral to GI and consider imaging - Plan: CMP, CBC with Differential/Platelet, Lipase, TSH, T4, Free, Hemoglobin A1c  Gastroesophageal reflux disease, unspecified whether esophagitis present  -Discussed silent reflux -We will start PPI to see if symptoms improve -For continued or worsening symptoms refer to GI - Plan: omeprazole (PRILOSEC) 40 MG capsule  Weight gain  -Discussed lifestyle modifications including avoiding snacking and increasing physical activity -We will  obtain labs -Further recommendations based on lab - Plan: TSH, T4, Free  F/u prn in 2-4 wks  Grier Mitts, MD

## 2020-08-27 NOTE — Telephone Encounter (Signed)
Order placed

## 2020-08-27 NOTE — Telephone Encounter (Signed)
Pt is calling in wanting to know if she could get her lab result she is aware that once it has been resulted someone will give her a call.

## 2020-08-28 NOTE — Telephone Encounter (Signed)
LVM to call back to schedule lab appointment.

## 2020-08-29 ENCOUNTER — Other Ambulatory Visit: Payer: Managed Care, Other (non HMO)

## 2020-09-08 ENCOUNTER — Other Ambulatory Visit: Payer: Self-pay

## 2020-09-08 ENCOUNTER — Other Ambulatory Visit (INDEPENDENT_AMBULATORY_CARE_PROVIDER_SITE_OTHER): Payer: Managed Care, Other (non HMO)

## 2020-09-08 DIAGNOSIS — R7989 Other specified abnormal findings of blood chemistry: Secondary | ICD-10-CM | POA: Diagnosis not present

## 2020-09-08 LAB — COMPREHENSIVE METABOLIC PANEL
ALT: 42 U/L — ABNORMAL HIGH (ref 0–35)
AST: 17 U/L (ref 0–37)
Albumin: 4 g/dL (ref 3.5–5.2)
Alkaline Phosphatase: 75 U/L (ref 47–119)
BUN: 11 mg/dL (ref 6–23)
CO2: 26 mEq/L (ref 19–32)
Calcium: 9.2 mg/dL (ref 8.4–10.5)
Chloride: 102 mEq/L (ref 96–112)
Creatinine, Ser: 0.65 mg/dL (ref 0.40–1.20)
GFR: 127.39 mL/min (ref >60.00)
Glucose, Bld: 95 mg/dL (ref 70–99)
Potassium: 4.5 mEq/L (ref 3.5–5.1)
Sodium: 137 mEq/L (ref 135–145)
Total Bilirubin: 0.5 mg/dL (ref 0.2–1.2)
Total Protein: 6.3 g/dL (ref 6.0–8.3)

## 2020-09-09 ENCOUNTER — Other Ambulatory Visit: Payer: Self-pay | Admitting: Family Medicine

## 2020-09-09 DIAGNOSIS — R7401 Elevation of levels of liver transaminase levels: Secondary | ICD-10-CM

## 2020-09-11 NOTE — Progress Notes (Signed)
Patient viewed results on MyChart. 

## 2020-09-15 ENCOUNTER — Emergency Department (HOSPITAL_COMMUNITY)
Admission: EM | Admit: 2020-09-15 | Discharge: 2020-09-16 | Disposition: A | Discharge status: Home / Self Care | Payer: Managed Care, Other (non HMO) | Attending: Emergency Medicine | Admitting: Emergency Medicine

## 2020-09-15 ENCOUNTER — Other Ambulatory Visit: Payer: Self-pay

## 2020-09-15 ENCOUNTER — Emergency Department (HOSPITAL_COMMUNITY): Payer: Managed Care, Other (non HMO)

## 2020-09-15 DIAGNOSIS — Z7982 Long term (current) use of aspirin: Secondary | ICD-10-CM | POA: Diagnosis not present

## 2020-09-15 DIAGNOSIS — R531 Weakness: Secondary | ICD-10-CM | POA: Diagnosis not present

## 2020-09-15 DIAGNOSIS — I10 Essential (primary) hypertension: Secondary | ICD-10-CM | POA: Insufficient documentation

## 2020-09-15 DIAGNOSIS — M546 Pain in thoracic spine: Secondary | ICD-10-CM | POA: Diagnosis not present

## 2020-09-15 DIAGNOSIS — M549 Dorsalgia, unspecified: Secondary | ICD-10-CM

## 2020-09-15 LAB — I-STAT BETA HCG BLOOD, ED (MC, WL, AP ONLY): I-stat hCG, quantitative: <5 m[IU]/mL (ref <5)

## 2020-09-15 LAB — CBC
HCT: 42.2 % (ref 36.0–46.0)
Hemoglobin: 13.3 g/dL (ref 12.0–15.0)
MCH: 23.3 pg — ABNORMAL LOW (ref 26.0–34.0)
MCHC: 31.5 g/dL (ref 30.0–36.0)
MCV: 73.8 fL — ABNORMAL LOW (ref 80.0–100.0)
Platelets: 464 10*3/uL — ABNORMAL HIGH (ref 150–400)
RBC: 5.72 MIL/uL — ABNORMAL HIGH (ref 3.87–5.11)
RDW: 14.7 % (ref 11.5–15.5)
WBC: 11.3 10*3/uL — ABNORMAL HIGH (ref 4.0–10.5)
nRBC: 0 % (ref 0.0–0.2)

## 2020-09-15 LAB — COMPREHENSIVE METABOLIC PANEL
ALT: 36 U/L (ref 0–44)
AST: 23 U/L (ref 15–41)
Albumin: 3.5 g/dL (ref 3.5–5.0)
Alkaline Phosphatase: 68 U/L (ref 38–126)
Anion gap: 10 (ref 5–15)
BUN: 7 mg/dL (ref 6–20)
CO2: 24 mmol/L (ref 22–32)
Calcium: 9.2 mg/dL (ref 8.9–10.3)
Chloride: 102 mmol/L (ref 98–111)
Creatinine, Ser: 0.76 mg/dL (ref 0.44–1.00)
GFR, Estimated: >60 mL/min (ref >60)
Glucose, Bld: 98 mg/dL (ref 70–99)
Potassium: 4 mmol/L (ref 3.5–5.1)
Sodium: 136 mmol/L (ref 135–145)
Total Bilirubin: 0.9 mg/dL (ref 0.3–1.2)
Total Protein: 6.4 g/dL — ABNORMAL LOW (ref 6.5–8.1)

## 2020-09-15 LAB — URINALYSIS, ROUTINE W REFLEX MICROSCOPIC
Bilirubin Urine: NEGATIVE
Glucose, UA: NEGATIVE mg/dL
Hgb urine dipstick: NEGATIVE
Ketones, ur: NEGATIVE mg/dL
Leukocytes,Ua: NEGATIVE
Nitrite: NEGATIVE
Protein, ur: NEGATIVE mg/dL
Specific Gravity, Urine: 1.023 (ref 1.005–1.030)
pH: 6 (ref 5.0–8.0)

## 2020-09-15 LAB — LIPASE, BLOOD: Lipase: 25 U/L (ref 11–51)

## 2020-09-15 NOTE — ED Triage Notes (Signed)
Pt reports R flank pain for a few weeks. Has seen her PCP for same and is being evaluated for possible liver failure. Reports emesis x 1 yesterday and shob while lying in bed last night. Denies abdominal pain.

## 2020-09-15 NOTE — ED Notes (Signed)
Called pt x3 for vitals with no answer. Will try again in 15-20 mins.

## 2020-09-16 NOTE — Discharge Instructions (Addendum)

## 2020-09-16 NOTE — ED Provider Notes (Signed)
[facility] EMERGENCY DEPARTMENT Provider Note   CSN: [id number] Arrival date & time: 09/15/20  1047     History Chief Complaint  Patient presents with   Shortness of Breath   Flank Pain   Emesis    Terri Cook is a 20 y.o. female.  The history is provided by the patient and a parent.     Patient with history of PCOS, hyperlipidemia hypertension, presents with right upper back pain.  She reports over the past day she has had right upper back pain that is worse with breathing, palpation and movement.  She has had mild shortness of breath.  She is reporting generalized weakness.  No fevers or vomiting.  No anterior chest pain.  She is now improved.  She was told that she has liver dysfunction and is  being evaluated as an outpatient.  Denies any alcohol or Tylenol use No history of CAD/VTE Past Medical History:  Diagnosis Date   Abnormal uterine bleeding    Acid reflux    High blood pressure    High cholesterol    Ingrown left big toenail 09/2016   recurrent   PCOS (polycystic ovarian syndrome)    Seasonal allergies     Patient Active Problem List   Diagnosis Date Noted   PCOS (polycystic ovarian syndrome)    Dysmetabolic syndrome 46/96/2952   Increased BMI 07/17/2017   Melanocytic nevus 07/17/2017   Hypovitaminosis D 09/04/2015   Hypertriglyceridemia 04/10/2015   Acanthosis 04/10/2015   Insulin resistance 04/10/2015   Menorrhagia 04/10/2015   CHICKENPOX, HX OF 08/03/2006    Past Surgical History:  Procedure Laterality Date   GALLBLADDER SURGERY  2021   LESION EXCISION Left 07/11/2014   Procedure: EXCISION OF BENIGN LESIONS INCLUDING MARGINS ;  Surgeon: Gerald Stabs, MD;  Location: [place];  Service: Pediatrics;  Laterality: Left;  mid back  and right neck   TOENAIL EXCISION  01/27/2012   Procedure: MINOR TOENAIL EXCISION;  Surgeon: Jerilynn Mages. Gerald Stabs, MD;  Location: [place];  Service: Pediatrics;   Laterality: Left;   TOENAIL EXCISION  03/23/2012   Procedure: MINOR TOENAIL EXCISION;  Surgeon: Jerilynn Mages. Gerald Stabs, MD;  Location: [place];  Service: Pediatrics;  Laterality: Right;   TOENAIL EXCISION Right 09/21/2012   Procedure: Partial Excision of Right Toenail;  Surgeon: Jerilynn Mages. Gerald Stabs, MD;  Location: [place];  Service: Pediatrics;  Laterality: Right;   TOENAIL EXCISION Left 02/15/2013   Procedure: PARTIAL TOENAIL EXCISION ON THE LEFT BIG TOE AT THE OUTER LATERAL FOLD WITH PHENOL (MP ROOM) ;  Surgeon: Jerilynn Mages. Gerald Stabs, MD;  Location: [place];  Service: Pediatrics;  Laterality: Left;   TOENAIL EXCISION Left 08/16/2013   Procedure: PARTIAL EXCISION OF LEFT BIG TOE NAIL FROM OUTER LATERAL FOLD;  Surgeon: Jerilynn Mages. Gerald Stabs, MD;  Location: [place];  Service: Pediatrics;  Laterality: Left;   TOENAIL EXCISION Left 09/16/2016   Procedure: PARTIAL PERMANENT EXCISION OF LEFT INGROWN BIG TOENAIL;  Surgeon: Gerald Stabs, MD;  Location: [place];  Service: General;  Laterality: Left;   WISDOM TOOTH EXTRACTION  12/08/2018   all 4     OB History     Gravida  0   Para  0   Term  0   Preterm  0   AB  0   Living  0      SAB  0   IAB  0  Ectopic  0   Multiple  0   Live Births  0           Family History  Problem Relation Age of Onset   Asthma Mother    Diabetes Father    Hypertension Father    Asthma Father    Cirrhosis Paternal Grandmother        caused by medication   Diabetes Paternal Grandmother    Cervical cancer Maternal Grandmother     Social History   Tobacco Use   Smoking status: Never   Smokeless tobacco: Never  Vaping Use   Vaping Use: Never used  Substance Use Topics   Alcohol use: No   Drug use: No    Home Medications Prior to Admission medications   Medication Sig Start Date End Date Taking? Authorizing Provider  Acetaminophen-Caff-Pyrilamine (MIDOL  MAX ST MENSTRUAL PO) Take 2 tablets by mouth 2 (two) times daily as needed (cramps).   Yes [provider]  albuterol (VENTOLIN HFA) 108 (90 Base) MCG/ACT inhaler Inhale 2 puffs into the lungs every 6 (six) hours as needed for wheezing or shortness of breath. 05/05/20  Yes Billie Ruddy, MD  Ascorbic Acid (VITAMIN C PO) Take 1,000 mg by mouth at bedtime.   Yes [provider]  aspirin-acetaminophen-caffeine (EXCEDRIN EXTRA STRENGTH) 3041103755 MG tablet Take 2 tablets by mouth every 8 (eight) hours as needed for headache or migraine.   Yes [provider]  Hulda Humphrey 0.4-35 MG-MCG tablet Take 1 tablet by mouth daily. 01/17/20  Yes Megan Salon, MD  cetirizine (ZYRTEC) 10 MG tablet Take 10 mg by mouth daily as needed for allergies.   Yes [provider]  fluticasone-salmeterol (ADVAIR HFA) 230-21 MCG/ACT inhaler Inhale 2 puffs into the lungs 2 (two) times daily. 05/28/20  Yes Margaretha Seeds, MD  ibuprofen (ADVIL,MOTRIN) 200 MG tablet Take 400 mg by mouth every 6 (six) hours as needed for mild pain or headache.   Yes [provider]  Multiple Vitamins-Minerals (ZINC PO) Take 1 tablet by mouth at bedtime.   Yes [provider]  omeprazole (PRILOSEC) 40 MG capsule Take 1 capsule (40 mg total) by mouth 2 (two) times daily. 08/14/20  Yes Billie Ruddy, MD  VITAMIN D PO Take 1,000 Units by mouth at bedtime.   Yes [provider]  betamethasone valerate ointment (VALISONE) 0.1 % Apply a pea sized amount topically BID for up to 2 weeks as needed Patient not taking: No sig reported 04/14/20   Salvadore Dom, MD  mupirocin ointment (BACTROBAN) 2 % Apply topically 2 (two) times daily as needed. Patient not taking: No sig reported 03/09/20   [provider]  nystatin cream (MYCOSTATIN) Apply 1 application topically 2 (two) times daily. Patient not taking: No sig reported 06/04/20   Billie Ruddy, MD  pantoprazole (PROTONIX) 40 MG  tablet Take 40 mg by mouth daily. 12/24/19 08/14/20  [provider]    Allergies    Clindamycin/lincomycin  Review of Systems   Review of Systems  Constitutional:  Positive for fatigue. Negative for fever.  Respiratory:  Positive for shortness of breath.   Musculoskeletal:  Positive for back pain.  All other systems reviewed and are negative.  Physical Exam Updated Vital Signs BP 125/81 (BP Location: Right Arm)   Pulse 81   Temp 98 F (36.7 C) (Oral)   Resp 18   SpO2 98%   Physical Exam CONSTITUTIONAL: Well developed/well nourished resting comfortably no acute  distress HEAD: Normocephalic/atraumatic EYES: EOMI/PERRL ENMT: Mucous membranes moist NECK: supple no meningeal signs SPINE/BACK:entire spine nontender CV: S1/S2 noted, no murmurs/rubs/gallops noted LUNGS: Lungs are clear to auscultation bilaterally, no apparent distress ABDOMEN: soft, nontender, no rebound or guarding, bowel sounds noted throughout abdomen GU:no cva tenderness NEURO: Pt is awake/alert/appropriate, moves all extremitiesx4.  No facial droop.  No arm or leg drift is noted Appropriate power noted in bilateral upper and lower extremities EXTREMITIES: pulses normal/equal, full ROM, tenderness adjacent to the right scapula.  No crepitus.  Pain is reproduced with movement No significant lower extremity edema or tenderness SKIN: warm, color normal PSYCH: no abnormalities of mood noted, alert and oriented to situation  ED Results / Procedures / Treatments   Labs (all labs ordered are listed, but only abnormal results are displayed) Labs Reviewed  COMPREHENSIVE METABOLIC PANEL - Abnormal; Notable for the following components:      Result Value   Total Protein 6.4 (*)    All other components within normal limits  CBC - Abnormal; Notable for the following components:   WBC 11.3 (*)    RBC 5.72 (*)    MCV 73.8 (*)    MCH 23.3 (*)    Platelets 464 (*)    All other components within normal limits   URINALYSIS, ROUTINE W REFLEX MICROSCOPIC - Abnormal; Notable for the following components:   APPearance HAZY (*)    All other components within normal limits  LIPASE, BLOOD  I-STAT BETA HCG BLOOD, ED ([facility], [facility], [facility] ONLY)    EKG EKG Interpretation  Date/Time:  Monday September 15 2020 11:07:03 EDT Ventricular Rate:  91 PR Interval:  114 QRS Duration: 70 QT Interval:  350 QTC Calculation: 430 R Axis:   63 Text Interpretation: Normal sinus rhythm Normal ECG normal axis No acute ischemia Confirmed by Lorre Munroe ([id number]) on 09/15/2020 4:36:20 PM  Radiology DG Chest 2 View  Result Date: 09/15/2020 CLINICAL DATA:  Shortness of breath EXAM: CHEST - 2 VIEW COMPARISON:  None. FINDINGS: Low lung volumes. No consolidation or edema. No pleural effusion. No pneumothorax. Cardiomediastinal contours are within normal limits with normal heart size. No acute osseous abnormality. IMPRESSION: No active cardiopulmonary disease. Electronically Signed   By: Macy Mis M.D.   On: 09/15/2020 11:43    Procedures Procedures   Medications Ordered in ED Medications - No data to display  ED Course  I have reviewed the triage vital signs and the nursing notes.  Pertinent labs & imaging results that were available during my care of the patient were reviewed by me and considered in my medical decision making (see chart for details).    MDM Rules/Calculators/A&P                          Patient had multiple complaints, but her main issue is right upper back pain.  No trauma reported.  No focal weakness.  At this point I feel patient is appropriate for discharge home.  Patient and mother agree with plan Final Clinical Impression(s) / ED Diagnoses Final diagnoses:  Acute back pain, unspecified back location, unspecified back pain laterality    Rx / DC Orders ED Discharge Orders     None        Ripley Fraise, MD 09/16/20 [phone]

## 2020-09-18 ENCOUNTER — Ambulatory Visit (INDEPENDENT_AMBULATORY_CARE_PROVIDER_SITE_OTHER): Payer: Managed Care, Other (non HMO) | Admitting: Family Medicine

## 2020-09-18 ENCOUNTER — Encounter: Payer: Self-pay | Admitting: Family Medicine

## 2020-09-18 ENCOUNTER — Other Ambulatory Visit: Payer: Self-pay

## 2020-09-18 VITALS — BP 110/80 | HR 127 | Temp 98.2°F | Wt 302.2 lb

## 2020-09-18 DIAGNOSIS — M62830 Muscle spasm of back: Secondary | ICD-10-CM

## 2020-09-18 DIAGNOSIS — M546 Pain in thoracic spine: Secondary | ICD-10-CM

## 2020-09-18 MED ORDER — MELOXICAM 7.5 MG PO TABS: 7.5000 mg | ORAL_TABLET | Freq: Every day | ORAL | 0 refills | Status: DC

## 2020-09-18 MED ORDER — CYCLOBENZAPRINE HCL 5 MG PO TABS: 5.0000 mg | ORAL_TABLET | Freq: Every evening | ORAL | 0 refills | Status: DC | PRN

## 2020-09-18 NOTE — Progress Notes (Signed)
Subjective:    Patient ID: Terri Cook, female    DOB: January 12, 2001, 20 y.o.   MRN: [id number]  Chief Complaint  Patient presents with   Follow-up    hospital    HPI Patient was seen today for ED follow-up.  Patient seen on 09/15/2020 for R upper back pain. Pt advised to take ibuprofen and use heat for muscle strain. Pt notes SOB, weakness in R arm, tried Ibuprofen 400 mg q 6 hrs which isn't helping.  Pt states she is hyperventilating when taking in a breath.  Pain is intermittent, increases with rest, and notices less with movement.  Pain was a 12/10.  Past Medical History:  Diagnosis Date   Abnormal uterine bleeding    Acid reflux    High blood pressure    High cholesterol    Ingrown left big toenail 09/2016   recurrent   PCOS (polycystic ovarian syndrome)    Seasonal allergies     Allergies  Allergen Reactions   Clindamycin/Lincomycin Anaphylaxis    Trouble breathing?    ROS General: Denies fever, chills, night sweats, changes in weight, changes in appetite HEENT: Denies headaches, ear pain, changes in vision, rhinorrhea, sore throat CV: Denies CP, palpitations, SOB, orthopnea +SOB Pulm: Denies SOB, cough, wheezing GI: Denies abdominal pain, nausea, vomiting, diarrhea, constipation GU: Denies dysuria, hematuria, frequency, vaginal discharge Msk: Denies muscle cramps, joint pains  +R upper back pain Neuro: Denies weakness, numbness, tingling Skin: Denies rashes, bruising Psych: Denies depression, anxiety, hallucinations      Objective:    Blood pressure 110/80, pulse (!) 127, temperature 98.2 F (36.8 C), temperature source Oral, weight (!) 302 lb 3.2 oz (137.1 kg), SpO2 98 %.  Gen. Pleasant, well-nourished, in no distress, normal affect   HEENT: [place]/AT, face symmetric, conjunctiva clear, no scleral icterus, PERRLA, EOMI, nares patent without drainage Lungs: no accessory muscle use, CTAB, no wheezes or rales Cardiovascular: Tachycardia, no m/r/g, no peripheral  edema Musculoskeletal: Mild TTP of R upper back , R thoracic paraspinal muscles.  No deformities, no cyanosis or clubbing, normal tone Neuro:  A&Ox3, CN II-XII intact, normal gait Skin:  Warm, no lesions/ rash   Wt Readings from Last 3 Encounters:  09/18/20 (!) 302 lb 3.2 oz (137.1 kg) (>99 %, Z= 2.86)*  08/14/20 296 lb 9.6 oz (134.5 kg) (>99 %, Z= 2.82)*  05/20/20 (!) 315 lb (142.9 kg) (>99 %, Z= 2.89)*   * Growth percentiles are based on CDC (Girls, 2-20 Years) data.    Lab Results  Component Value Date   WBC 11.3 (H) 09/15/2020   HGB 13.3 09/15/2020   HCT 42.2 09/15/2020   PLT 464 (H) 09/15/2020   GLUCOSE 98 09/15/2020   CHOL 255 (H) 01/17/2020   TRIG 275 (H) 01/17/2020   HDL 46 01/17/2020   LDLCALC 158 (H) 01/17/2020   ALT 36 09/15/2020   AST 23 09/15/2020   NA 136 09/15/2020   K 4.0 09/15/2020   CL 102 09/15/2020   CREATININE 0.76 09/15/2020   BUN 7 09/15/2020   CO2 24 09/15/2020   TSH 1.43 08/14/2020   HGBA1C 5.7 08/14/2020    Assessment/Plan:  Acute right-sided thoracic back pain -likely 2/2 muscle strain  -supportive care including stretching NSAIDs, heat, massage, rest, etc -Patient encouraged to limit heavy lifting, pushing pulling when possible.  Given note for work. -Given handouts -Consider PT - Plan: meloxicam (MOBIC) 7.5 MG tablet, cyclobenzaprine (FLEXERIL) 5 MG tablet  Muscle spasm of back -  Plan:  cyclobenzaprine (FLEXERIL) 5 MG tablet  F/u prn  Grier Mitts, MD

## 2020-10-24 ENCOUNTER — Encounter: Payer: Self-pay | Admitting: Family Medicine

## 2020-10-24 ENCOUNTER — Telehealth (INDEPENDENT_AMBULATORY_CARE_PROVIDER_SITE_OTHER): Payer: Managed Care, Other (non HMO) | Admitting: Family Medicine

## 2020-10-24 DIAGNOSIS — U071 COVID-19: Secondary | ICD-10-CM

## 2020-10-24 MED ORDER — MOLNUPIRAVIR EUA 200MG CAPSULE: 4.0000 | ORAL_CAPSULE | Freq: Two times a day (BID) | ORAL | 0 refills | Status: AC

## 2020-10-24 NOTE — Progress Notes (Signed)
Virtual Visit via Telephone Note  I connected with Terri Cook on 10/24/20 at  4:15 PM EDT by telephone and verified that I am speaking with the correct person using two identifiers.   I discussed the limitations, risks, security and privacy concerns of performing an evaluation and management service by telephone and the availability of in person appointments. I also discussed with the patient that there may be a patient responsible charge related to this service. The patient expressed understanding and agreed to proceed.  Location patient: home Location provider: work or home office Participants present for the call: patient, provider Patient did not have a visit in the prior 7 days to address this/these issue(s). Chief Complaint  Patient presents with   Covid Positive    Tested yesterday, has cough, fever, loss of taste, and congestion. Has taken ibuprofen and cough syrup.    History of Present Illness: Pt started feeling bad yesterday 7/21.  She woke up with chills, fever T max 100.53F, loss of taste, sore throat, HA, rhinorrhea, nasal congestion, diarrhea.  Denies n/v, sick contacts.  Pt had 2 positive at home COVID tests.  Tried ibuprofen and cough syrup.   Observations/Objective: Patient sounds cheerful and well on the phone. I do not appreciate any SOB. Speech and thought processing are grossly intact. Patient reported vitals:  Assessment and Plan: COVID-19 virus infection  -positive covid test on 10/23/20 -continue supportive care: including OTC cough and cold meds, rest, hydration -discussed r/b/a of antiviral meds.  Pt wishes to start molnupiravir -given strict precautions - Plan: molnupiravir EUA 200 mg CAPS  Follow Up Instructions: F/u prn   99441 5-10 99442 11-20 9443 21-30 I did not refer this patient for an OV in the next 24 hours for this/these issue(s).  I discussed the assessment and treatment plan with the patient. The patient was provided an opportunity  to ask questions and all were answered. The patient agreed with the plan and demonstrated an understanding of the instructions.   The patient was advised to call back or seek an in-person evaluation if the symptoms worsen or if the condition fails to improve as anticipated.  I provided 14 minutes of non-face-to-face time during this encounter.  Billie Ruddy, MD

## 2020-10-28 ENCOUNTER — Ambulatory Visit: Payer: Managed Care, Other (non HMO) | Admitting: Nurse Practitioner

## 2020-11-05 ENCOUNTER — Ambulatory Visit (INDEPENDENT_AMBULATORY_CARE_PROVIDER_SITE_OTHER): Payer: Managed Care, Other (non HMO) | Admitting: Family Medicine

## 2020-11-05 ENCOUNTER — Other Ambulatory Visit: Payer: Self-pay

## 2020-11-05 ENCOUNTER — Encounter: Payer: Self-pay | Admitting: Family Medicine

## 2020-11-05 VITALS — BP 140/80 | HR 123 | Temp 98.4°F | Wt 302.7 lb

## 2020-11-05 DIAGNOSIS — J3489 Other specified disorders of nose and nasal sinuses: Secondary | ICD-10-CM | POA: Diagnosis not present

## 2020-11-05 MED ORDER — AMOXICILLIN 875 MG PO TABS: 875.0000 mg | ORAL_TABLET | Freq: Two times a day (BID) | ORAL | 0 refills | Status: AC

## 2020-11-05 NOTE — Progress Notes (Signed)
Established Patient Office Visit  Subjective:  Patient ID: Terri Cook, female    DOB: 2000/04/15  Age: 20 y.o. MRN: [id number]  CC:  Chief Complaint  Patient presents with   Eye Pain    R eye, x 3 days, eye pressure, facial pain in forehead above the eye, eye doc can not see her until October bc she would be a new patient,     HPI Terri Cook presents for right frontal sinus pressure for the past several days.  She actually tested positive for COVID back in July.  She felt like she was recovering from that but then developed the right frontal pressure.  Somewhat worse with lying supine.  Occasional headaches.  No fever.  Denies any bloody or purulent secretions.  She took some over-the-counter ibuprofen and phenylephrine which helped slightly.  Denies any cough.  No sore throat.  Past Medical History:  Diagnosis Date   Abnormal uterine bleeding    Acid reflux    High blood pressure    High cholesterol    Ingrown left big toenail 09/2016   recurrent   PCOS (polycystic ovarian syndrome)    Seasonal allergies     Past Surgical History:  Procedure Laterality Date   GALLBLADDER SURGERY  2021   LESION EXCISION Left 07/11/2014   Procedure: EXCISION OF BENIGN LESIONS INCLUDING MARGINS ;  Surgeon: Gerald Stabs, MD;  Location: [place];  Service: Pediatrics;  Laterality: Left;  mid back  and right neck   TOENAIL EXCISION  01/27/2012   Procedure: MINOR TOENAIL EXCISION;  Surgeon: Jerilynn Mages. Gerald Stabs, MD;  Location: [place];  Service: Pediatrics;  Laterality: Left;   TOENAIL EXCISION  03/23/2012   Procedure: MINOR TOENAIL EXCISION;  Surgeon: Jerilynn Mages. Gerald Stabs, MD;  Location: [place];  Service: Pediatrics;  Laterality: Right;   TOENAIL EXCISION Right 09/21/2012   Procedure: Partial Excision of Right Toenail;  Surgeon: Jerilynn Mages. Gerald Stabs, MD;  Location: [place];  Service: Pediatrics;  Laterality: Right;   TOENAIL  EXCISION Left 02/15/2013   Procedure: PARTIAL TOENAIL EXCISION ON THE LEFT BIG TOE AT THE OUTER LATERAL FOLD WITH PHENOL (MP ROOM) ;  Surgeon: Jerilynn Mages. Gerald Stabs, MD;  Location: [place];  Service: Pediatrics;  Laterality: Left;   TOENAIL EXCISION Left 08/16/2013   Procedure: PARTIAL EXCISION OF LEFT BIG TOE NAIL FROM OUTER LATERAL FOLD;  Surgeon: Jerilynn Mages. Gerald Stabs, MD;  Location: [place];  Service: Pediatrics;  Laterality: Left;   TOENAIL EXCISION Left 09/16/2016   Procedure: PARTIAL PERMANENT EXCISION OF LEFT INGROWN BIG TOENAIL;  Surgeon: Gerald Stabs, MD;  Location: [place];  Service: General;  Laterality: Left;   WISDOM TOOTH EXTRACTION  12/08/2018   all 4    Family History  Problem Relation Age of Onset   Asthma Mother    Diabetes Father    Hypertension Father    Asthma Father    Cirrhosis Paternal Grandmother        caused by medication   Diabetes Paternal Grandmother    Cervical cancer Maternal Grandmother     Social History   Socioeconomic History   Marital status: Single    Spouse name: Not on file   Number of children: Not on file   Years of education: Not on file   Highest education level: Not on file  Occupational History   Not on file  Tobacco Use   Smoking  status: Never   Smokeless tobacco: Never  Vaping Use   Vaping Use: Never used  Substance and Sexual Activity   Alcohol use: No   Drug use: No   Sexual activity: Never    Birth control/protection: Abstinence, OCP  Other Topics Concern   Not on file  Social History Narrative   Is in 9th grade homeschooled   Social Determinants of Health   Financial Resource Strain: Not on file  Food Insecurity: Not on file  Transportation Needs: Not on file  Physical Activity: Not on file  Stress: Not on file  Social Connections: Not on file  Intimate Partner Violence: Not on file    Outpatient Medications Prior to Visit  Medication Sig Dispense Refill    Acetaminophen-Caff-Pyrilamine (MIDOL MAX ST MENSTRUAL PO) Take 2 tablets by mouth 2 (two) times daily as needed (cramps).     albuterol (VENTOLIN HFA) 108 (90 Base) MCG/ACT inhaler Inhale 2 puffs into the lungs every 6 (six) hours as needed for wheezing or shortness of breath. 8 g 0   Ascorbic Acid (VITAMIN C PO) Take 1,000 mg by mouth at bedtime.     aspirin-acetaminophen-caffeine (EXCEDRIN EXTRA STRENGTH) 250-250-65 MG tablet Take 2 tablets by mouth every 8 (eight) hours as needed for headache or migraine.     BALZIVA 0.4-35 MG-MCG tablet Take 1 tablet by mouth daily. 84 tablet 4   betamethasone valerate ointment (VALISONE) 0.1 % Apply a pea sized amount topically BID for up to 2 weeks as needed 30 g 0   cetirizine (ZYRTEC) 10 MG tablet Take 10 mg by mouth daily as needed for allergies.     cyclobenzaprine (FLEXERIL) 5 MG tablet Take 1 tablet (5 mg total) by mouth at bedtime as needed for muscle spasms. 30 tablet 0   fluticasone-salmeterol (ADVAIR HFA) 230-21 MCG/ACT inhaler Inhale 2 puffs into the lungs 2 (two) times daily. 1 each 12   ibuprofen (ADVIL,MOTRIN) 200 MG tablet Take 400 mg by mouth every 6 (six) hours as needed for mild pain or headache.     meloxicam (MOBIC) 7.5 MG tablet Take 1 tablet (7.5 mg total) by mouth daily. 30 tablet 0   Multiple Vitamins-Minerals (ZINC PO) Take 1 tablet by mouth at bedtime.     mupirocin ointment (BACTROBAN) 2 % Apply topically 2 (two) times daily as needed.     nystatin cream (MYCOSTATIN) Apply 1 application topically 2 (two) times daily. 30 g 0   omeprazole (PRILOSEC) 40 MG capsule Take 1 capsule (40 mg total) by mouth 2 (two) times daily. 60 capsule 3   VITAMIN D PO Take 1,000 Units by mouth at bedtime.     No facility-administered medications prior to visit.    Allergies  Allergen Reactions   Clindamycin/Lincomycin Anaphylaxis    Trouble breathing?    ROS Review of Systems  Constitutional:  Negative for chills and fever.  HENT:  Positive  for sinus pressure and sinus pain. Negative for sore throat.   Respiratory:  Negative for cough.      Objective:    Physical Exam Vitals reviewed.  Constitutional:      Appearance: Normal appearance.  HENT:     Right Ear: Tympanic membrane normal.     Left Ear: Tympanic membrane normal.  Eyes:     Extraocular Movements: Extraocular movements intact.  Cardiovascular:     Rate and Rhythm: Normal rate and regular rhythm.  Pulmonary:     Effort: Pulmonary effort is normal.     Breath  sounds: Normal breath sounds.  Musculoskeletal:     Cervical back: Neck supple.  Neurological:     Mental Status: She is alert.    BP 140/80 (BP Location: Left Arm, Patient Position: Sitting, Cuff Size: Normal)   Pulse (!) 123   Temp 98.4 F (36.9 C) (Oral)   Wt (!) 302 lb 11.2 oz (137.3 kg)   SpO2 100%   BMI 46.03 kg/m  Wt Readings from Last 3 Encounters:  11/05/20 (!) 302 lb 11.2 oz (137.3 kg) (>99 %, Z= 2.87)*  09/18/20 (!) 302 lb 3.2 oz (137.1 kg) (>99 %, Z= 2.86)*  08/14/20 296 lb 9.6 oz (134.5 kg) (>99 %, Z= 2.82)*   * Growth percentiles are based on CDC (Girls, 2-20 Years) data.     Health Maintenance Due  Topic Date Due   COVID-19 Vaccine (1) Never done   HIV Screening  Never done   Hepatitis C Screening  Never done   INFLUENZA VACCINE  11/03/2020    There are no preventive care reminders to display for this patient.  Lab Results  Component Value Date   TSH 1.43 08/14/2020   Lab Results  Component Value Date   WBC 11.3 (H) 09/15/2020   HGB 13.3 09/15/2020   HCT 42.2 09/15/2020   MCV 73.8 (L) 09/15/2020   PLT 464 (H) 09/15/2020   Lab Results  Component Value Date   NA 136 09/15/2020   K 4.0 09/15/2020   CO2 24 09/15/2020   GLUCOSE 98 09/15/2020   BUN 7 09/15/2020   CREATININE 0.76 09/15/2020   BILITOT 0.9 09/15/2020   ALKPHOS 68 09/15/2020   AST 23 09/15/2020   ALT 36 09/15/2020   PROT 6.4 (L) 09/15/2020   ALBUMIN 3.5 09/15/2020   CALCIUM 9.2 09/15/2020    ANIONGAP 10 09/15/2020   GFR 127.39 09/08/2020   Lab Results  Component Value Date   CHOL 255 (H) 01/17/2020   Lab Results  Component Value Date   HDL 46 01/17/2020   Lab Results  Component Value Date   LDLCALC 158 (H) 01/17/2020   Lab Results  Component Value Date   TRIG 275 (H) 01/17/2020   Lab Results  Component Value Date   CHOLHDL 5.5 (H) 01/17/2020   Lab Results  Component Value Date   HGBA1C 5.7 08/14/2020      Assessment & Plan:   Problem List Items Addressed This Visit   None Visit Diagnoses     Frontal sinus pain    -  Primary     Patient presents with right frontal sinus pressure and pain localized just over the frontal sinus following recent COVID infection.  No fever.  -Start amoxicillin 875 mg twice daily for 10 days. -Plenty fluids -Cautioned about phenylephrine with her elevated blood pressure history -Follow-up with primary for any persistent or worsening symptoms.  Meds ordered this encounter  Medications   amoxicillin (AMOXIL) 875 MG tablet    Sig: Take 1 tablet (875 mg total) by mouth 2 (two) times daily for 10 days.    Dispense:  20 tablet    Refill:  0    Follow-up: No follow-ups on file.    Carolann Littler, MD

## 2020-11-15 ENCOUNTER — Encounter: Payer: Self-pay | Admitting: Family Medicine

## 2020-11-15 DIAGNOSIS — R638 Other symptoms and signs concerning food and fluid intake: Secondary | ICD-10-CM

## 2020-11-15 DIAGNOSIS — R635 Abnormal weight gain: Secondary | ICD-10-CM

## 2020-11-27 ENCOUNTER — Ambulatory Visit (INDEPENDENT_AMBULATORY_CARE_PROVIDER_SITE_OTHER): Payer: Managed Care, Other (non HMO) | Admitting: Family Medicine

## 2020-11-27 ENCOUNTER — Encounter: Payer: Self-pay | Admitting: Family Medicine

## 2020-11-27 ENCOUNTER — Other Ambulatory Visit: Payer: Self-pay

## 2020-11-27 VITALS — BP 126/78 | HR 89 | Temp 98.1°F | Wt 299.0 lb

## 2020-11-27 DIAGNOSIS — J029 Acute pharyngitis, unspecified: Secondary | ICD-10-CM | POA: Diagnosis not present

## 2020-11-27 MED ORDER — AMOXICILLIN-POT CLAVULANATE 500-125 MG PO TABS: 1.0000 | ORAL_TABLET | Freq: Two times a day (BID) | ORAL | 0 refills | Status: AC

## 2020-11-27 NOTE — Progress Notes (Signed)
Subjective:    Patient ID: Terri Cook, female    DOB: 2000/07/25, 20 y.o.   MRN: [id number]  Chief Complaint  Patient presents with   Sore Throat    Tonsils swollen and white spots on throat    HPI Patient was seen today for acute concern.  Pt endorses sore throat with swollen tonsils and white spots in throat off and on x a few wks.  Pt tried taking cold medicine at night.  Denies sick contacts, fever, chills, n/v.  Past Medical History:  Diagnosis Date   Abnormal uterine bleeding    Acid reflux    High blood pressure    High cholesterol    Ingrown left big toenail 09/2016   recurrent   PCOS (polycystic ovarian syndrome)    Seasonal allergies     Allergies  Allergen Reactions   Clindamycin/Lincomycin Anaphylaxis    Trouble breathing?    ROS General: Denies fever, chills, night sweats, changes in weight, changes in appetite HEENT: Denies headaches, ear pain, changes in vision, rhinorrhea +sore throat CV: Denies CP, palpitations, SOB, orthopnea Pulm: Denies SOB, cough, wheezing GI: Denies abdominal pain, nausea, vomiting, diarrhea, constipation GU: Denies dysuria, hematuria, frequency, vaginal discharge Msk: Denies muscle cramps, joint pains Neuro: Denies weakness, numbness, tingling Skin: Denies rashes, bruising Psych: Denies depression, anxiety, hallucinations      Objective:    Blood pressure 126/78, pulse 89, temperature 98.1 F (36.7 C), temperature source Oral, weight 299 lb (135.6 kg), SpO2 99 %.  Gen. Pleasant, well-nourished, in no distress, normal affect   HEENT: [place]/AT, face symmetric, conjunctiva clear, no scleral icterus, PERRLA, EOMI, nares patent without drainage, pharynx with erythema and exudate. Mild cervical lymphadenopathy. Lungs: no accessory muscle use, CTAB, no wheezes or rales Cardiovascular: RRR, no m/r/g, no peripheral edema Musculoskeletal: No deformities, no cyanosis or clubbing, normal tone Neuro:  A&Ox3, CN II-XII intact, normal  gait Skin:  Warm, no lesions/ rash  Wt Readings from Last 3 Encounters:  11/27/20 299 lb (135.6 kg) (>99 %, Z= 2.86)*  11/05/20 (!) 302 lb 11.2 oz (137.3 kg) (>99 %, Z= 2.87)*  09/18/20 (!) 302 lb 3.2 oz (137.1 kg) (>99 %, Z= 2.86)*   * Growth percentiles are based on CDC (Girls, 2-20 Years) data.    Lab Results  Component Value Date   WBC 11.3 (H) 09/15/2020   HGB 13.3 09/15/2020   HCT 42.2 09/15/2020   PLT 464 (H) 09/15/2020   GLUCOSE 98 09/15/2020   CHOL 255 (H) 01/17/2020   TRIG 275 (H) 01/17/2020   HDL 46 01/17/2020   LDLCALC 158 (H) 01/17/2020   ALT 36 09/15/2020   AST 23 09/15/2020   NA 136 09/15/2020   K 4.0 09/15/2020   CL 102 09/15/2020   CREATININE 0.76 09/15/2020   BUN 7 09/15/2020   CO2 24 09/15/2020   TSH 1.43 08/14/2020   HGBA1C 5.7 08/14/2020    Assessment/Plan:  Pharyngitis, unspecified etiology  -Discussed supportive care including gargling with warm salt water or Chloraseptic spray, warm fluids, hydration, rest, NSAIDs or Tylenol as needed for any pain/discomfort -Will start Augmentin. -Given precautions - Plan: POC Rapid Strep A, Culture, Group A Strep, amoxicillin-clavulanate (AUGMENTIN) 500-125 MG tablet  F/u prn  Grier Mitts, MD

## 2020-11-29 LAB — CULTURE, GROUP A STREP
MICRO NUMBER:: 12291353
SPECIMEN QUALITY:: ADEQUATE

## 2020-12-23 ENCOUNTER — Encounter: Payer: Self-pay | Admitting: Obstetrics and Gynecology

## 2020-12-23 ENCOUNTER — Ambulatory Visit (INDEPENDENT_AMBULATORY_CARE_PROVIDER_SITE_OTHER): Payer: Managed Care, Other (non HMO) | Admitting: Obstetrics and Gynecology

## 2020-12-23 ENCOUNTER — Other Ambulatory Visit: Payer: Self-pay

## 2020-12-23 VITALS — BP 122/70 | HR 103 | Ht 68.0 in | Wt 305.0 lb

## 2020-12-23 DIAGNOSIS — N926 Irregular menstruation, unspecified: Secondary | ICD-10-CM | POA: Diagnosis not present

## 2020-12-23 DIAGNOSIS — N766 Ulceration of vulva: Secondary | ICD-10-CM

## 2020-12-23 DIAGNOSIS — Z113 Encounter for screening for infections with a predominantly sexual mode of transmission: Secondary | ICD-10-CM | POA: Diagnosis not present

## 2020-12-23 LAB — PREGNANCY, URINE: Preg Test, Ur: NEGATIVE

## 2020-12-23 MED ORDER — VALACYCLOVIR HCL 1 G PO TABS: 1000.0000 mg | ORAL_TABLET | Freq: Two times a day (BID) | ORAL | 0 refills | Status: DC

## 2020-12-23 NOTE — Patient Instructions (Signed)
Genital Herpes Genital herpes is a common sexually transmitted infection (STI) that is caused by a virus. The virus spreads from person to person through sexual contact. The infection can cause itching, blisters, and sores around the genitals or rectum. Symptoms may last for several days and then go away. This is called an outbreak. The virus remains in the body, however, so more outbreaks may happen in the future. The time between outbreaks varies and can be from months to years. Genital herpes can affect anyone. It is particularly concerning for pregnant women because the virus can be passed to the baby during delivery and can cause serious problems. Genital herpes is also a concern for people who have a weak disease-fighting system (immune system). What are the causes? This condition is caused by the human herpesvirus, also called herpes simplex virus, type 1 or type 2 (HSV-1 or HSV-2). The virus may spread through: Sexual contact with an infected person, including vaginal, anal, and oral sex. Contact with fluid from a herpes sore. The skin. This means that you can get herpes from an infected partner even if there are no blisters or sores present. Your partner may not know that he or she is infected. What increases the risk? You are more likely to develop this condition if: You have sex with many partners. You do not use latex condoms during sex. What are the signs or symptoms? Most people do not have symptoms (are asymptomatic), or they have mild symptoms that may be mistaken for other skin problems. Symptoms may include: Small, red bumps near the genitals, rectum, or mouth. These bumps turn into blisters and then sores. Flu-like symptoms, including: Fever. Body aches. Swollen lymph nodes. Headache. Painful urination. Pain and itching in the genital area or rectal area. Vaginal discharge. Tingling or shooting pain in the legs and buttocks. Generally, symptoms are more severe and last  longer during the first (primary) outbreak. Flu-like symptoms are also more common during the primary outbreak. How is this diagnosed? This condition may be diagnosed based on: A physical exam. Your medical history. Blood tests. A test of a fluid sample (culture) from an open sore. How is this treated? There is no cure for this condition, but treatment with antiviral medicines that are taken by mouth (orally) can do the following: Speed up healing and relieve symptoms. Help to reduce the spread of the virus to sexual partners. Limit the chance of future outbreaks, or make future outbreaks shorter. Lessen symptoms of future outbreaks. Your health care provider may also recommend pain relief medicines, such as aspirin or ibuprofen. Follow these instructions at home: If you have an outbreak: Keep the affected areas dry and clean. Avoid rubbing or touching blisters and sores. If you do touch blisters or sores: Wash your hands thoroughly with soap and water. Do not touch your eyes afterward. To help relieve pain or itching, you may take the following actions as told by your health care provider: Apply a cold, wet cloth (cold compress) to affected areas 4-6 times a day. Apply a substance that protects your skin and reduces bleeding (astringent). Apply a gel that helps relieve pain around sores (lidocaine gel). Take a warm, shallow bath that cleans the genital area (sitz bath). Sexual activity Do not have sexual contact during active outbreaks. Practice safe sex. Herpes can spread even if your partner does not have blisters or sores. Latex condoms and female condoms may help prevent the spread of the herpes virus. General instructions Take over-the-counter and  prescription medicines only as told by your health care provider. Keep all follow-up visits as told by your health care provider. This is important. How is this prevented? Use condoms. Although you can get genital herpes during sexual  contact even with the use of a condom, a condom can provide some protection. Avoid having multiple sexual partners. Talk with your sexual partner about any symptoms either of you may have. Also, talk with your partner about any history of STIs. Get tested for STIs before you have sex. Ask your partner to do the same. Do not have sexual contact if you have active symptoms of genital herpes. Contact a health care provider if: Your symptoms are not improving with medicine. Your symptoms return, or you have new symptoms. You have a fever. You have abdominal pain. You have redness, swelling, or pain in your eye. You notice new sores on other parts of your body. You are a woman and you experience bleeding between menstrual periods. You have had herpes and you become pregnant or plan to become pregnant. Summary Genital herpes is a common sexually transmitted infection (STI) that is caused by the herpes simplex virus, type 1 or type 2 (HSV-1 or HSV-2). These viruses are most often spread through sexual contact with an infected person. You are more likely to develop this condition if you have sex with many partners or you do not use condoms during sex. Most people do not have symptoms (are asymptomatic) or have mild symptoms that may be mistaken for other skin problems. Symptoms occur as outbreaks that may happen months or years apart. There is no cure for this condition, but treatment with oral antiviral medicines can reduce symptoms, reduce the chance of spreading the virus to a partner, prevent future outbreaks, or shorten future outbreaks. This information is not intended to replace advice given to you by your health care provider. Make sure you discuss any questions you have with your health care provider. Document Revised: 11/30/2018 Document Reviewed: 11/30/2018 Elsevier Patient Education  [place].

## 2020-12-23 NOTE — Progress Notes (Signed)
GYNECOLOGY  VISIT   HPI: 20 y.o.   Single White or Caucasian Not Hispanic or Latino  female   [id number] with No LMP recorded.   here for a painful bump in the clitoral area.  Was last sexually active 2.5 weeks ago with a new partner. Didn't use condoms. Hurts to void.  No abnormal d/c. No itching, no odor.   GYNECOLOGIC HISTORY: No LMP recorded. Contraception: OCP Menopausal hormone therapy: none         OB History     Gravida  0   Para  0   Term  0   Preterm  0   AB  0   Living  0      SAB  0   IAB  0   Ectopic  0   Multiple  0   Live Births  0              Patient Active Problem List   Diagnosis Date Noted   PCOS (polycystic ovarian syndrome)    Dysmetabolic syndrome 45/80/9983   Increased BMI 07/17/2017   Melanocytic nevus 07/17/2017   Hypovitaminosis D 09/04/2015   Hypertriglyceridemia 04/10/2015   Acanthosis 04/10/2015   Insulin resistance 04/10/2015   Menorrhagia 04/10/2015   CHICKENPOX, HX OF 08/03/2006    Past Medical History:  Diagnosis Date   Abnormal uterine bleeding    Acid reflux    High blood pressure    High cholesterol    Ingrown left big toenail 09/2016   recurrent   PCOS (polycystic ovarian syndrome)    Seasonal allergies     Past Surgical History:  Procedure Laterality Date   GALLBLADDER SURGERY  2021   LESION EXCISION Left 07/11/2014   Procedure: EXCISION OF BENIGN LESIONS INCLUDING MARGINS ;  Surgeon: Gerald Stabs, MD;  Location: [facility];  Service: Pediatrics;  Laterality: Left;  mid back  and right neck   TOENAIL EXCISION  01/27/2012   Procedure: MINOR TOENAIL EXCISION;  Surgeon: Jerilynn Mages. Gerald Stabs, MD;  Location: [facility];  Service: Pediatrics;  Laterality: Left;   TOENAIL EXCISION  03/23/2012   Procedure: MINOR TOENAIL EXCISION;  Surgeon: Jerilynn Mages. Gerald Stabs, MD;  Location: [facility];  Service: Pediatrics;  Laterality: Right;   TOENAIL EXCISION Right 09/21/2012    Procedure: Partial Excision of Right Toenail;  Surgeon: Jerilynn Mages. Gerald Stabs, MD;  Location: [facility];  Service: Pediatrics;  Laterality: Right;   TOENAIL EXCISION Left 02/15/2013   Procedure: PARTIAL TOENAIL EXCISION ON THE LEFT BIG TOE AT THE OUTER LATERAL FOLD WITH PHENOL (MP ROOM) ;  Surgeon: Jerilynn Mages. Gerald Stabs, MD;  Location: [facility];  Service: Pediatrics;  Laterality: Left;   TOENAIL EXCISION Left 08/16/2013   Procedure: PARTIAL EXCISION OF LEFT BIG TOE NAIL FROM OUTER LATERAL FOLD;  Surgeon: Jerilynn Mages. Gerald Stabs, MD;  Location: [facility];  Service: Pediatrics;  Laterality: Left;   TOENAIL EXCISION Left 09/16/2016   Procedure: PARTIAL PERMANENT EXCISION OF LEFT INGROWN BIG TOENAIL;  Surgeon: Gerald Stabs, MD;  Location: [facility];  Service: General;  Laterality: Left;   WISDOM TOOTH EXTRACTION  12/08/2018   all 4    Current Outpatient Medications  Medication Sig Dispense Refill   Acetaminophen-Caff-Pyrilamine (MIDOL MAX ST MENSTRUAL PO) Take 2 tablets by mouth 2 (two) times daily as needed (cramps).     albuterol (VENTOLIN HFA) 108 (90 Base) MCG/ACT inhaler Inhale 2 puffs into the lungs every  6 (six) hours as needed for wheezing or shortness of breath. 8 g 0   Ascorbic Acid (VITAMIN C PO) Take 1,000 mg by mouth at bedtime.     aspirin-acetaminophen-caffeine (EXCEDRIN EXTRA STRENGTH) 250-250-65 MG tablet Take 2 tablets by mouth every 8 (eight) hours as needed for headache or migraine.     BALZIVA 0.4-35 MG-MCG tablet Take 1 tablet by mouth daily. 84 tablet 4   betamethasone valerate ointment (VALISONE) 0.1 % Apply a pea sized amount topically BID for up to 2 weeks as needed 30 g 0   cetirizine (ZYRTEC) 10 MG tablet Take 10 mg by mouth daily as needed for allergies.     cyclobenzaprine (FLEXERIL) 5 MG tablet Take 1 tablet (5 mg total) by mouth at bedtime as needed for muscle spasms. 30 tablet 0   fluticasone-salmeterol (ADVAIR  HFA) 230-21 MCG/ACT inhaler Inhale 2 puffs into the lungs 2 (two) times daily. 1 each 12   ibuprofen (ADVIL,MOTRIN) 200 MG tablet Take 400 mg by mouth every 6 (six) hours as needed for mild pain or headache.     meloxicam (MOBIC) 7.5 MG tablet Take 1 tablet (7.5 mg total) by mouth daily. 30 tablet 0   Multiple Vitamins-Minerals (ZINC PO) Take 1 tablet by mouth at bedtime.     mupirocin ointment (BACTROBAN) 2 % Apply topically 2 (two) times daily as needed.     nystatin cream (MYCOSTATIN) Apply 1 application topically 2 (two) times daily. 30 g 0   omeprazole (PRILOSEC) 40 MG capsule Take 1 capsule (40 mg total) by mouth 2 (two) times daily. 60 capsule 3   VITAMIN D PO Take 1,000 Units by mouth at bedtime.     No current facility-administered medications for this visit.     ALLERGIES: Clindamycin/lincomycin  Family History  Problem Relation Age of Onset   Asthma Mother    Diabetes Father    Hypertension Father    Asthma Father    Cirrhosis Paternal Grandmother        caused by medication   Diabetes Paternal Grandmother    Cervical cancer Maternal Grandmother     Social History   Socioeconomic History   Marital status: Single    Spouse name: Not on file   Number of children: Not on file   Years of education: Not on file   Highest education level: Not on file  Occupational History   Not on file  Tobacco Use   Smoking status: Never   Smokeless tobacco: Never  Vaping Use   Vaping Use: Never used  Substance and Sexual Activity   Alcohol use: No   Drug use: No   Sexual activity: Never    Birth control/protection: Abstinence, OCP  Other Topics Concern   Not on file  Social History Narrative   Is in 9th grade homeschooled   Social Determinants of Health   Financial Resource Strain: Not on file  Food Insecurity: Not on file  Transportation Needs: Not on file  Physical Activity: Not on file  Stress: Not on file  Social Connections: Not on file  Intimate Partner  Violence: Not on file    Review of Systems  All other systems reviewed and are negative.  PHYSICAL EXAMINATION:    There were no vitals taken for this visit.    General appearance: alert, cooperative and appears stated age   Pelvic: External genitalia:  multiple ulcers over the clitoris and one on the right labia majora  Urethra:  normal appearing urethra with no masses, tenderness or lesions              Bartholins and Skenes: normal                 Vagina: normal appearing vagina with normal color and discharge, no lesions              Cervix: no lesions             Chaperone was present for exam.  1. Vulvar ulcer Exam c/w HSV - valACYclovir (VALTREX) 1000 MG tablet; Take 1 tablet (1,000 mg total) by mouth 2 (two) times daily. Take for 10 days  Dispense: 20 tablet; Refill: 0 - SureSwab HSV, Type 1/2 DNA, PCR -Discussed herpes, information given  2. Missed menses On OCP's - Pregnancy, urine  3. Screening examination for STD (sexually transmitted disease) - SURESWAB CT/NG/T. vaginalis - RPR - HIV Antibody (routine testing w rflx) - Hepatitis C antibody -Condom use encouraged  Addendum: at the end of the visit the patient states that she may have had sores before, not sure.

## 2020-12-24 ENCOUNTER — Encounter: Payer: Self-pay | Admitting: Registered"

## 2020-12-24 ENCOUNTER — Encounter: Payer: Managed Care, Other (non HMO) | Attending: Family Medicine | Admitting: Registered"

## 2020-12-24 DIAGNOSIS — E669 Obesity, unspecified: Secondary | ICD-10-CM | POA: Insufficient documentation

## 2020-12-24 LAB — RPR: RPR Ser Ql: NONREACTIVE

## 2020-12-24 LAB — SURESWAB CT/NG/T. VAGINALIS
C. trachomatis RNA, TMA: NOT DETECTED
N. gonorrhoeae RNA, TMA: NOT DETECTED
Trichomonas vaginalis RNA: NOT DETECTED

## 2020-12-24 LAB — HEPATITIS C ANTIBODY
Hepatitis C Ab: NONREACTIVE
SIGNAL TO CUT-OFF: 0.01

## 2020-12-24 LAB — HIV ANTIBODY (ROUTINE TESTING W REFLEX): HIV 1&2 Ab, 4th Generation: NONREACTIVE

## 2020-12-24 NOTE — Progress Notes (Signed)
Medical Nutrition Therapy:  Appt start time: 1115 end time:  1215.  Assessment:  Primary concerns today: Pt referred due to wt management. Pt present for appointment alone.  Pt reports she is overweight and she doesn't want to be overweight anymore. Feels it is stopping her from doing many things and wants help with wt loss. Pt reports wanting to be able to wear smaller clothes and eat less food.   Lives with parents and 3 siblings. Pt cooks sometimes, trying to learn more about cooking. Eats at home more often than out.   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   Pt reports she was unaware that she has PCOS. Pt wants to know if she can tell her boyfriend having PCOS is why she has a hard time losing wt. Pt reports she tells her boyfriend a lot of things.   Food Allergies/Intolerances: None reported.   GI Concerns: GER.   Sleep Routine: Wakes around 830 AM and goes to bed around 12 midnight.   Pertinent Lab Values:  08/14/20:  HgbA1c: 5.7  01/17/20:  Triglycerides: 275 VLDL Cholesterol: 51 LDL Cholesterol: 158 Chol/HDL Ratio: 5.5  Weight Hx: See chart.   Preferred Learning Style:  No preference indicated   Learning Readiness:  Ready  MEDICATIONS: Reviewed. See list. Supplements: vitamin D, multivitamin.    DIETARY INTAKE:  Usual eating pattern includes 3 meals and 3 snacks per day.   Common foods: N/A.  Avoided foods: peanut butter and jelly sandwiches, brussels sprouts.    Typical Snacks:Takis, Cheez Its.     Typical Beverages: flavored water x 4 bottles, occasionally sweet tea or soda.  Location of Meals: Sometimes with family. Eats in livingroom sometimes.   Electronics Present at [employer]: Yes: phone   24-hr recall:  B (930 AM): bowl of Frosted Mini Wheats cereal with 2% milk  Snk ( AM): None reported.   L ( PM): beef crunch wrap Janine Limbo (boss brought it), water Snk ( PM): Cheez Its about 1 or 1.5 handful, water  D (6 PM):  vegetables (broccoli, cauliflower,etc) and chicken with soy sauce, water  Snk (7 PM): 1 scoop vanilla ice cream with chocolate syrup Beverages:   Usual physical activity: exercise bike x 30 minutes x daily (started recently), walks 2 miles x almost daily.   Progress Towards Goal(s):  In progress.   Nutritional Diagnosis:  NB-1.1 Food and nutrition-related knowledge deficit As related to balanced nutrition.  As evidenced by pt's reported dietary intake and habits .    Intervention:  Nutrition counseling provided. Dietitian provided education regarding balanced nutrition and nutrition to help with balancing blood sugar/PCOS. Discussed benefits of focusing on habits rather than wt. Discussed pt having PCOS and effects on wt. Worked with pt to set goals. Pt appeared agreeable to information/goals discussed.   Instructions/Goals:  Make sure to get in three meals per day. Try to have balanced meals like the My Plate example (see handout). Include lean proteins, vegetables, fruits, and whole grains at meals.    Work to have ratios/portions like the balanced plate example.   Water Goals: Try to include at least 1 bottle regular water along with your flavored water.   Cereals or protein bars: try for at least 8 g protein and 4 g fiber. Try Special K Protein Cereal   Try to include balanced snacks (see handout) to help reduce cravings and manage blood sugar.    Make physical activity a part of your week.  Try to include at least 30 minutes of physical activity 5 days each week or at least 150 minutes per week. Regular physical activity promotes overall health-including helping to reduce risk for heart disease and diabetes, promoting mental health, and helping [place] sleep better.    Include at least 30 minutes activity x 5 days per week.   Teaching Method Utilized: Visual Auditory  Handouts given during visit include: Balanced plate and food list.   Barriers to learning/adherence to lifestyle  change: None reported.   Demonstrated degree of understanding via:  Teach Back   Monitoring/Evaluation:  Dietary intake, exercise, and body weight in 2 month(s).

## 2020-12-24 NOTE — Patient Instructions (Addendum)
Instructions/Goals:  Make sure to get in three meals per day. Try to have balanced meals like the My Plate example (see handout). Include lean proteins, vegetables, fruits, and whole grains at meals.    Work to have ratios/portions like the balanced plate example.   Water Goals: Try to include at least 1 bottle regular water along with your flavored water.   Cereals or protein bars: try for at least 8 g protein and 4 g fiber. Try Special K Protein Cereal   Try to include balanced snacks (see handout) to help reduce cravings and manage blood sugar.    Make physical activity a part of your week. Try to include at least 30 minutes of physical activity 5 days each week or at least 150 minutes per week. Regular physical activity promotes overall health-including helping to reduce risk for heart disease and diabetes, promoting mental health, and helping [place] sleep better.    Include at least 30 minutes activity x 5 days per week.

## 2020-12-25 ENCOUNTER — Other Ambulatory Visit: Payer: Self-pay | Admitting: Obstetrics and Gynecology

## 2020-12-25 LAB — SURESWAB HSV, TYPE 1/2 DNA, PCR
HSV 1 DNA: NOT DETECTED
HSV 2 DNA: DETECTED — AB

## 2020-12-25 MED ORDER — VALACYCLOVIR HCL 500 MG PO TABS: ORAL_TABLET | ORAL | 2 refills | Status: DC

## 2021-01-01 ENCOUNTER — Telehealth: Payer: Self-pay | Admitting: *Deleted

## 2021-01-01 NOTE — Telephone Encounter (Signed)
Patient called asked when to start the valtrex 500 mg tablet dose. Patient was prescribed Valtrex 1000 mg bid x 10 days. I explained to patient the 500 mg dose is if she has recurrent outbreak. Patient verbalized she understood, patient also mentioned her cycle stopped 2 day ago and now has vaginal itching which is normal after her cycle. Patient reports the itching normally stops after 4 days post cycle. I recommended she monitor for now and if continues past her normal,then to schedule a office visit.

## 2021-01-02 ENCOUNTER — Encounter: Payer: Self-pay | Admitting: Registered"

## 2021-01-09 ENCOUNTER — Encounter: Payer: Self-pay | Admitting: Family Medicine

## 2021-01-09 ENCOUNTER — Other Ambulatory Visit: Payer: Self-pay

## 2021-01-09 ENCOUNTER — Other Ambulatory Visit: Payer: Self-pay | Admitting: Family Medicine

## 2021-01-09 ENCOUNTER — Ambulatory Visit (INDEPENDENT_AMBULATORY_CARE_PROVIDER_SITE_OTHER): Payer: Managed Care, Other (non HMO) | Admitting: Family Medicine

## 2021-01-09 VITALS — BP 128/86 | HR 86 | Temp 98.1°F | Ht 68.0 in | Wt 307.8 lb

## 2021-01-09 DIAGNOSIS — Z6841 Body Mass Index (BMI) 40.0 and over, adult: Secondary | ICD-10-CM | POA: Diagnosis not present

## 2021-01-09 DIAGNOSIS — E781 Pure hyperglyceridemia: Secondary | ICD-10-CM

## 2021-01-09 DIAGNOSIS — B009 Herpesviral infection, unspecified: Secondary | ICD-10-CM | POA: Insufficient documentation

## 2021-01-09 DIAGNOSIS — E282 Polycystic ovarian syndrome: Secondary | ICD-10-CM

## 2021-01-09 DIAGNOSIS — E559 Vitamin D deficiency, unspecified: Secondary | ICD-10-CM

## 2021-01-09 DIAGNOSIS — Z0000 Encounter for general adult medical examination without abnormal findings: Secondary | ICD-10-CM

## 2021-01-09 LAB — TSH: TSH: 2.99 u[IU]/mL (ref 0.35–5.50)

## 2021-01-09 LAB — CBC WITH DIFFERENTIAL/PLATELET
Basophils Absolute: 0.1 10*3/uL (ref 0.0–0.1)
Basophils Relative: 0.8 % (ref 0.0–3.0)
Eosinophils Absolute: 0.2 10*3/uL (ref 0.0–0.7)
Eosinophils Relative: 2 % (ref 0.0–5.0)
HCT: 40.5 % (ref 36.0–46.0)
Hemoglobin: 13.2 g/dL (ref 12.0–15.0)
Lymphocytes Relative: 18.6 % (ref 12.0–46.0)
Lymphs Abs: 2 10*3/uL (ref 0.7–4.0)
MCHC: 32.5 g/dL (ref 30.0–36.0)
MCV: 73.9 fl — ABNORMAL LOW (ref 78.0–100.0)
Monocytes Absolute: 0.7 10*3/uL (ref 0.1–1.0)
Monocytes Relative: 7 % (ref 3.0–12.0)
Neutro Abs: 7.6 10*3/uL (ref 1.4–7.7)
Neutrophils Relative %: 71.6 % (ref 43.0–77.0)
Platelets: 416 10*3/uL — ABNORMAL HIGH (ref 150.0–400.0)
RBC: 5.48 Mil/uL — ABNORMAL HIGH (ref 3.87–5.11)
RDW: 15.5 % — ABNORMAL HIGH (ref 11.5–14.6)
WBC: 10.6 10*3/uL — ABNORMAL HIGH (ref 4.5–10.5)

## 2021-01-09 LAB — HEMOGLOBIN A1C: Hgb A1c MFr Bld: 5.8 % (ref 4.6–6.5)

## 2021-01-09 LAB — COMPREHENSIVE METABOLIC PANEL
ALT: 57 U/L — ABNORMAL HIGH (ref 0–35)
AST: 28 U/L (ref 0–37)
Albumin: 4 g/dL (ref 3.5–5.2)
Alkaline Phosphatase: 68 U/L (ref 39–117)
BUN: 5 mg/dL — ABNORMAL LOW (ref 6–23)
CO2: 28 mEq/L (ref 19–32)
Calcium: 9.2 mg/dL (ref 8.4–10.5)
Chloride: 101 mEq/L (ref 96–112)
Creatinine, Ser: 0.64 mg/dL (ref 0.40–1.20)
GFR: 127.56 mL/min (ref >60.00)
Glucose, Bld: 92 mg/dL (ref 70–99)
Potassium: 3.8 mEq/L (ref 3.5–5.1)
Sodium: 139 mEq/L (ref 135–145)
Total Bilirubin: 0.5 mg/dL (ref 0.2–1.2)
Total Protein: 6.9 g/dL (ref 6.0–8.3)

## 2021-01-09 LAB — VITAMIN D 25 HYDROXY (VIT D DEFICIENCY, FRACTURES): VITD: 18.4 ng/mL — ABNORMAL LOW (ref 30.00–100.00)

## 2021-01-09 LAB — LIPID PANEL
Cholesterol: 205 mg/dL — ABNORMAL HIGH (ref 0–200)
HDL: 47.9 mg/dL (ref >39.00)
LDL Cholesterol: 132 mg/dL — ABNORMAL HIGH (ref 0–99)
NonHDL: 157.56
Total CHOL/HDL Ratio: 4
Triglycerides: 127 mg/dL (ref 0.0–149.0)
VLDL: 25.4 mg/dL (ref 0.0–40.0)

## 2021-01-09 LAB — T4, FREE: Free T4: 0.72 ng/dL (ref 0.60–1.60)

## 2021-01-09 MED ORDER — VITAMIN D (ERGOCALCIFEROL) 1.25 MG (50000 UNIT) PO CAPS: 50000.0000 [IU] | ORAL_CAPSULE | ORAL | 0 refills | Status: DC

## 2021-01-09 NOTE — Progress Notes (Signed)
Subjective:     Terri Cook is a 20 y.o. female and is here for a comprehensive physical exam. The patient reports no problems.  Patient states she is doing well overall.  Staying busy with work.  Had well woman exam with OB/GYN.  Social History   Socioeconomic History   Marital status: Single    Spouse name: Not on file   Number of children: Not on file   Years of education: Not on file   Highest education level: Not on file  Occupational History   Not on file  Tobacco Use   Smoking status: Never   Smokeless tobacco: Never  Vaping Use   Vaping Use: Never used  Substance and Sexual Activity   Alcohol use: No   Drug use: No   Sexual activity: Never    Birth control/protection: Abstinence, OCP  Other Topics Concern   Not on file  Social History Narrative   Is in 9th grade homeschooled   Social Determinants of Health   Financial Resource Strain: Not on file  Food Insecurity: No Food Insecurity   Worried About Charity fundraiser in the Last Year: Never true   Ran Out of Food in the Last Year: Never true  Transportation Needs: Not on file  Physical Activity: Not on file  Stress: Not on file  Social Connections: Not on file  Intimate Partner Violence: Not on file   Health Maintenance  Topic Date Due   INFLUENZA VACCINE  11/03/2020   COVID-19 Vaccine (1) 04/04/2021 (Originally 06/26/2001)   TETANUS/TDAP  09/08/2023   HPV VACCINES  Completed   Hepatitis C Screening  Completed   HIV Screening  Completed    The following portions of the patient's history were reviewed and updated as appropriate: allergies, current medications, past family history, past medical history, past social history, past surgical history, and problem list.  Review of Systems Pertinent items noted in HPI and remainder of comprehensive ROS otherwise negative.   Objective:    BP 128/86 (BP Location: Right Arm, Patient Position: Sitting, Cuff Size: Large)   Pulse 86   Temp 98.1 F (36.7 C)  (Oral)   Ht 5\' 8"  (1.727 m)   Wt (!) 307 lb 12.8 oz (139.6 kg)   SpO2 98%   BMI 46.80 kg/m  General appearance: alert, cooperative, and no distress Head: Normocephalic, without obvious abnormality, atraumatic Eyes: conjunctivae/corneas clear. PERRL, EOM's intact. Fundi benign. Ears: normal TM's and external ear canals both ears Nose: Nares normal. Septum midline. Mucosa normal. No drainage or sinus tenderness. Throat: lips, mucosa, and tongue normal; teeth and gums normal Neck: no adenopathy, no carotid bruit, no JVD, supple, symmetrical, trachea midline, and thyroid not enlarged, symmetric, no tenderness/mass/nodules Lungs: clear to auscultation bilaterally Heart: regular rate and rhythm, S1, S2 normal, no murmur, click, rub or gallop Abdomen: soft, non-tender; bowel sounds normal; no masses,  no organomegaly Extremities: extremities normal, atraumatic, no cyanosis or edema Pulses: 2+ and symmetric Skin: Skin color, texture, turgor normal. No rashes or lesions Lymph nodes: Cervical, supraclavicular, and axillary nodes normal. Neurologic: Alert and oriented X 3, normal strength and tone. Normal symmetric reflexes. Normal coordination and gait    Assessment:    Healthy female exam.      Plan:    Anticipatory guidance given including wearing seatbelts, smoke detectors in the home, increasing physical activity, increasing p.o. intake of water and vegetables. -Labs -Declined influenza vaccine this visit -Seen by OB/GYN -Given handout -Next CPE 1 year  See After Visit Summary for Counseling Recommendations   Hypertriglyceridemia -Lifestyle modifications - Plan: Lipid panel  PCOS (polycystic ovarian syndrome) -Diet and exercise encouraged -Continue follow-up with OB/GYN -Consider starting metformin. - Plan: TSH, T4, Free, Hemoglobin A1c, CMP  Class 3 severe obesity due to excess calories with body mass index (BMI) of 45.0 to 49.9 in adult, unspecified whether serious  comorbidity present (HCC)  -Lifestyle modifications -Consider medication options - Plan: TSH, T4, Free, CMP, Vitamin D, 25-hydroxy  F/u prn  Grier Mitts, MD

## 2021-01-13 ENCOUNTER — Ambulatory Visit (INDEPENDENT_AMBULATORY_CARE_PROVIDER_SITE_OTHER): Payer: Managed Care, Other (non HMO) | Admitting: Obstetrics and Gynecology

## 2021-01-13 ENCOUNTER — Other Ambulatory Visit: Payer: Self-pay

## 2021-01-13 VITALS — BP 120/80 | HR 88 | Ht 68.0 in | Wt 309.0 lb

## 2021-01-13 DIAGNOSIS — Z01419 Encounter for gynecological examination (general) (routine) without abnormal findings: Secondary | ICD-10-CM | POA: Diagnosis not present

## 2021-01-13 DIAGNOSIS — Z3041 Encounter for surveillance of contraceptive pills: Secondary | ICD-10-CM | POA: Diagnosis not present

## 2021-01-13 MED ORDER — BALZIVA 0.4-35 MG-MCG PO TABS
1.0000 | ORAL_TABLET | Freq: Every day | ORAL | 4 refills | Status: DC
Start: 2021-01-13 — End: 2022-01-14

## 2021-01-13 NOTE — Progress Notes (Signed)
20 y.o. G0P0000 Single White or Caucasian Not Hispanic or Latino female here for annual exam.  Just diagnosed with HSV a few weeks ago. Patient states that she is no longer having the itching from the herpes out break. She has valtrex to take prn.  Sexually active, same partner x 2 months. Using condoms.  She is on OCP's, cycles are monthly x 4-5 days. She can saturate a pad in 6 hours. No BTB. Mild cramps.    Other than HSV 2, her recent std testing was negative.   No bowel or bladder c/o.   Patient's last menstrual period was 12/29/2020 (approximate).          Sexually active: Yes.    The current method of family planning is OCP (estrogen/progesterone).    Exercising: Yes.     Walking and biking  Smoker:  no  Health Maintenance: Pap:  never  History of abnormal Pap:  no MMG:  never  BMD:   never  Colonoscopy: never  TDaP:  09/07/13 Gardasil: x2   reports that she has never smoked. She has never used smokeless tobacco. She reports that she does not drink alcohol and does not use drugs. Working at [employer] doing on [employer]  Past Medical History:  Diagnosis Date   Abnormal uterine bleeding    Acid reflux    Asthma    High blood pressure    High cholesterol    Ingrown left big toenail 09/2016   recurrent   PCOS (polycystic ovarian syndrome)    Seasonal allergies     Past Surgical History:  Procedure Laterality Date   GALLBLADDER SURGERY  2021   LESION EXCISION Left 07/11/2014   Procedure: EXCISION OF BENIGN LESIONS INCLUDING MARGINS ;  Surgeon: Gerald Stabs, MD;  Location: [facility];  Service: Pediatrics;  Laterality: Left;  mid back  and right neck   TOENAIL EXCISION  01/27/2012   Procedure: MINOR TOENAIL EXCISION;  Surgeon: Jerilynn Mages. Gerald Stabs, MD;  Location: [facility];  Service: Pediatrics;  Laterality: Left;   TOENAIL EXCISION  03/23/2012   Procedure: MINOR TOENAIL EXCISION;  Surgeon: Jerilynn Mages. Gerald Stabs, MD;  Location: [facility];  Service: Pediatrics;  Laterality: Right;   TOENAIL EXCISION Right 09/21/2012   Procedure: Partial Excision of Right Toenail;  Surgeon: Jerilynn Mages. Gerald Stabs, MD;  Location: [facility];  Service: Pediatrics;  Laterality: Right;   TOENAIL EXCISION Left 02/15/2013   Procedure: PARTIAL TOENAIL EXCISION ON THE LEFT BIG TOE AT THE OUTER LATERAL FOLD WITH PHENOL (MP ROOM) ;  Surgeon: Jerilynn Mages. Gerald Stabs, MD;  Location: [facility];  Service: Pediatrics;  Laterality: Left;   TOENAIL EXCISION Left 08/16/2013   Procedure: PARTIAL EXCISION OF LEFT BIG TOE NAIL FROM OUTER LATERAL FOLD;  Surgeon: Jerilynn Mages. Gerald Stabs, MD;  Location: [facility];  Service: Pediatrics;  Laterality: Left;   TOENAIL EXCISION Left 09/16/2016   Procedure: PARTIAL PERMANENT EXCISION OF LEFT INGROWN BIG TOENAIL;  Surgeon: Gerald Stabs, MD;  Location: [facility];  Service: General;  Laterality: Left;   WISDOM TOOTH EXTRACTION  12/08/2018   all 4    Current Outpatient Medications  Medication Sig Dispense Refill   Acetaminophen-Caff-Pyrilamine (MIDOL MAX ST MENSTRUAL PO) Take 2 tablets by mouth 2 (two) times daily as needed (cramps).     albuterol (VENTOLIN HFA) 108 (90 Base) MCG/ACT inhaler Inhale 2 puffs into the lungs every 6 (six) hours as needed for  wheezing or shortness of breath. 8 g 0   Ascorbic Acid (VITAMIN C PO) Take 1,000 mg by mouth at bedtime.     aspirin-acetaminophen-caffeine (EXCEDRIN EXTRA STRENGTH) 250-250-65 MG tablet Take 2 tablets by mouth every 8 (eight) hours as needed for headache or migraine.     BALZIVA 0.4-35 MG-MCG tablet Take 1 tablet by mouth daily. 84 tablet 4   cetirizine (ZYRTEC) 10 MG tablet Take 10 mg by mouth daily as needed for allergies.     cyclobenzaprine (FLEXERIL) 5 MG tablet Take 1 tablet (5 mg total) by mouth at bedtime as needed for muscle spasms. 30 tablet 0   ibuprofen (ADVIL,MOTRIN) 200 MG tablet Take 400 mg by mouth  every 6 (six) hours as needed for mild pain or headache.     Multiple Vitamins-Minerals (ZINC PO) Take 1 tablet by mouth at bedtime.     omeprazole (PRILOSEC) 40 MG capsule Take 1 capsule (40 mg total) by mouth 2 (two) times daily. 60 capsule 3   valACYclovir (VALTREX) 1000 MG tablet Take 1 tablet (1,000 mg total) by mouth 2 (two) times daily. Take for 10 days 20 tablet 0   valACYclovir (VALTREX) 500 MG tablet Take one tablet po BID x 3 days with an outbreak 30 tablet 2   VITAMIN D PO Take 1,000 Units by mouth at bedtime.     Vitamin D, Ergocalciferol, (DRISDOL) 1.25 MG (50000 UNIT) CAPS capsule Take 1 capsule (50,000 Units total) by mouth every 7 (seven) days. 12 capsule 0   No current facility-administered medications for this visit.    Family History  Problem Relation Age of Onset   Asthma Mother    Diabetes Father    Hypertension Father    Asthma Father    Cirrhosis Paternal Grandmother        caused by medication   Diabetes Paternal Grandmother    Cervical cancer Maternal Grandmother     Review of Systems  All other systems reviewed and are negative.  Exam:   BP 120/80   Pulse 88   Ht 5\' 8"  (1.727 m)   Wt (!) 309 lb (140.2 kg)   LMP 12/29/2020 (Approximate)   SpO2 98%   BMI 46.98 kg/m   Weight change: @WEIGHTCHANGE @ Height:   Height: 5\' 8"  (172.7 cm)  Ht Readings from Last 3 Encounters:  01/13/21 5\' 8"  (1.727 m)  01/09/21 5\' 8"  (1.727 m)  12/23/20 5\' 8"  (1.727 m) (93 %, Z= 1.45)*   * Growth percentiles are based on CDC (Girls, 2-20 Years) data.    General appearance: alert, cooperative and appears stated age Head: Normocephalic, without obvious abnormality, atraumatic Neck: no adenopathy, supple, symmetrical, trachea midline and thyroid normal to inspection and palpation Lungs: clear to auscultation bilaterally Cardiovascular: regular rate and rhythm Breasts: normal appearance, no masses or tenderness Abdomen: soft, non-tender; non distended,  no masses,  no  organomegaly Extremities: extremities normal, atraumatic, no cyanosis or edema Skin: Skin color, texture, turgor normal. No rashes or lesions Lymph nodes: Cervical, supraclavicular, and axillary nodes normal. No abnormal inguinal nodes palpated Neurologic: Grossly normal   Pelvic: External genitalia:  no lesions              Urethra:  normal appearing urethra with no masses, tenderness or lesions              Bartholins and Skenes: normal                 Vagina: normal appearing vagina with  normal color and discharge, no lesions              Cervix: no lesions               Bimanual Exam:  Uterus:   anteverted, not appreciably enlarged, mobile, not tender              Adnexa: no mass, fullness, tenderness                 Gae Dry chaperoned for the exam.  1. Well woman exam Discussed breast self exam Discussed calcium and vit D intake Labs with primary STD testing UTD Discussed gardasil, she is due for her 3rd shot. Will consider, information given  2. Encounter for surveillance of contraceptive pills - BALZIVA 0.4-35 MG-MCG tablet; Take 1 tablet by mouth daily.  Dispense: 84 tablet; Refill: 4

## 2021-01-29 ENCOUNTER — Ambulatory Visit: Payer: Managed Care, Other (non HMO) | Admitting: Obstetrics and Gynecology

## 2021-02-12 ENCOUNTER — Telehealth (INDEPENDENT_AMBULATORY_CARE_PROVIDER_SITE_OTHER): Payer: Managed Care, Other (non HMO) | Admitting: Family Medicine

## 2021-02-12 ENCOUNTER — Encounter: Payer: Self-pay | Admitting: Family Medicine

## 2021-02-12 DIAGNOSIS — J069 Acute upper respiratory infection, unspecified: Secondary | ICD-10-CM

## 2021-02-12 MED ORDER — BENZONATATE 100 MG PO CAPS: 100.0000 mg | ORAL_CAPSULE | Freq: Two times a day (BID) | ORAL | 0 refills | Status: DC | PRN

## 2021-02-12 NOTE — Progress Notes (Signed)
Virtual Visit via Video Note  I connected with Terri Cook on 02/12/21 at  1:00 PM EST by a video enabled telemedicine application 2/2 [id number] pandemic and verified that I am speaking with the correct person using two identifiers.  Location patient: home Location provider:work or home office Persons participating in the virtual visit: patient, provider  I discussed the limitations of evaluation and management by telemedicine and the availability of in person appointments. The patient expressed understanding and agreed to proceed.  Chief Complaint  Patient presents with   Nasal Congestion    Thought it was a cold, but was not a cold. Covid test was negative, has been congested for 4-5 days, taken cough syrup, and ibuprofen but after about 30 minutes she is coughing again. Mom has a URI.     HPI: Pt with chest congestion x 4-5 days.  Symptoms became worse.  COVID test was negative.  Pt with mildly productive cough, sore throat, R ear pain, rhinorrhea, HAs Nausea and emesis when symptoms first started. Denies fever, diarrhea facial pain/pressure, ear pressure.  Pt's mom was recently sick with viral illness.  Tried ibuprofen, cough syrup, chloraseptic spray.   Patient has continued to go to work despite feeling sick.  ROS: See pertinent positives and negatives per HPI.  Past Medical History:  Diagnosis Date   Abnormal uterine bleeding    Acid reflux    Asthma    High blood pressure    High cholesterol    Ingrown left big toenail 09/2016   recurrent   PCOS (polycystic ovarian syndrome)    Seasonal allergies     Past Surgical History:  Procedure Laterality Date   GALLBLADDER SURGERY  2021   LESION EXCISION Left 07/11/2014   Procedure: EXCISION OF BENIGN LESIONS INCLUDING MARGINS ;  Surgeon: Gerald Stabs, MD;  Location: [facility];  Service: Pediatrics;  Laterality: Left;  mid back  and right neck   TOENAIL EXCISION  01/27/2012   Procedure: MINOR TOENAIL EXCISION;   Surgeon: Jerilynn Mages. Gerald Stabs, MD;  Location: [facility];  Service: Pediatrics;  Laterality: Left;   TOENAIL EXCISION  03/23/2012   Procedure: MINOR TOENAIL EXCISION;  Surgeon: Jerilynn Mages. Gerald Stabs, MD;  Location: [facility];  Service: Pediatrics;  Laterality: Right;   TOENAIL EXCISION Right 09/21/2012   Procedure: Partial Excision of Right Toenail;  Surgeon: Jerilynn Mages. Gerald Stabs, MD;  Location: [facility];  Service: Pediatrics;  Laterality: Right;   TOENAIL EXCISION Left 02/15/2013   Procedure: PARTIAL TOENAIL EXCISION ON THE LEFT BIG TOE AT THE OUTER LATERAL FOLD WITH PHENOL (MP ROOM) ;  Surgeon: Jerilynn Mages. Gerald Stabs, MD;  Location: [facility];  Service: Pediatrics;  Laterality: Left;   TOENAIL EXCISION Left 08/16/2013   Procedure: PARTIAL EXCISION OF LEFT BIG TOE NAIL FROM OUTER LATERAL FOLD;  Surgeon: Jerilynn Mages. Gerald Stabs, MD;  Location: [facility];  Service: Pediatrics;  Laterality: Left;   TOENAIL EXCISION Left 09/16/2016   Procedure: PARTIAL PERMANENT EXCISION OF LEFT INGROWN BIG TOENAIL;  Surgeon: Gerald Stabs, MD;  Location: [facility];  Service: General;  Laterality: Left;   WISDOM TOOTH EXTRACTION  12/08/2018   all 4    Family History  Problem Relation Age of Onset   Asthma Mother    Diabetes Father    Hypertension Father    Asthma Father    Cirrhosis Paternal Grandmother        caused by medication  Diabetes Paternal Grandmother    Cervical cancer Maternal Grandmother     Current Outpatient Medications:    Acetaminophen-Caff-Pyrilamine (MIDOL MAX ST MENSTRUAL PO), Take 2 tablets by mouth 2 (two) times daily as needed (cramps)., Disp: , Rfl:    albuterol (VENTOLIN HFA) 108 (90 Base) MCG/ACT inhaler, Inhale 2 puffs into the lungs every 6 (six) hours as needed for wheezing or shortness of breath., Disp: 8 g, Rfl: 0   Ascorbic Acid (VITAMIN C PO), Take 1,000 mg by mouth at bedtime., Disp: , Rfl:     aspirin-acetaminophen-caffeine (EXCEDRIN EXTRA STRENGTH) 250-250-65 MG tablet, Take 2 tablets by mouth every 8 (eight) hours as needed for headache or migraine., Disp: , Rfl:    BALZIVA 0.4-35 MG-MCG tablet, Take 1 tablet by mouth daily., Disp: 84 tablet, Rfl: 4   cetirizine (ZYRTEC) 10 MG tablet, Take 10 mg by mouth daily as needed for allergies., Disp: , Rfl:    cyclobenzaprine (FLEXERIL) 5 MG tablet, Take 1 tablet (5 mg total) by mouth at bedtime as needed for muscle spasms., Disp: 30 tablet, Rfl: 0   ibuprofen (ADVIL,MOTRIN) 200 MG tablet, Take 400 mg by mouth every 6 (six) hours as needed for mild pain or headache., Disp: , Rfl:    Multiple Vitamins-Minerals (ZINC PO), Take 1 tablet by mouth at bedtime., Disp: , Rfl:    omeprazole (PRILOSEC) 40 MG capsule, Take 1 capsule (40 mg total) by mouth 2 (two) times daily., Disp: 60 capsule, Rfl: 3   valACYclovir (VALTREX) 500 MG tablet, Take one tablet po BID x 3 days with an outbreak, Disp: 30 tablet, Rfl: 2   VITAMIN D PO, Take 1,000 Units by mouth at bedtime., Disp: , Rfl:    Vitamin D, Ergocalciferol, (DRISDOL) 1.25 MG (50000 UNIT) CAPS capsule, Take 1 capsule (50,000 Units total) by mouth every 7 (seven) days., Disp: 12 capsule, Rfl: 0  EXAM:  VITALS per patient if applicable: RR between 83-15 bpm  GENERAL: alert, oriented, appears well and in no acute distress  HEENT: atraumatic, conjunctiva clear, no obvious abnormalities on inspection of external nose and ears  NECK: normal movements of the head and neck  LUNGS: on inspection no signs of respiratory distress, breathing rate appears normal, no obvious gross SOB, gasping or wheezing  CV: no obvious cyanosis  MS: moves all visible extremities without noticeable abnormality  PSYCH/NEURO: pleasant and cooperative, no obvious depression or anxiety, speech and thought processing grossly intact  ASSESSMENT AND PLAN:  Discussed the following assessment and plan:  Viral URI with cough -  Plan: benzonatate (TESSALON) 100 MG capsule -COVID-19 testing negative -continue supportive care including rest, fluids, gargling with warm salt water or chloraseptic spray, OTC cough/cold medication, honey -Advised to use Flonase nasal spray.  States that at home. -Plan: Tessalon  Follow-up as needed for continued or worsening symptoms   I discussed the assessment and treatment plan with the patient. The patient was provided an opportunity to ask questions and all were answered. The patient agreed with the plan and demonstrated an understanding of the instructions.   The patient was advised to call back or seek an in-person evaluation if the symptoms worsen or if the condition fails to improve as anticipated.  Billie Ruddy, MD

## 2021-02-19 ENCOUNTER — Other Ambulatory Visit: Payer: Self-pay | Admitting: Family Medicine

## 2021-02-19 DIAGNOSIS — K219 Gastro-esophageal reflux disease without esophagitis: Secondary | ICD-10-CM

## 2021-02-24 ENCOUNTER — Telehealth (INDEPENDENT_AMBULATORY_CARE_PROVIDER_SITE_OTHER): Payer: Managed Care, Other (non HMO) | Admitting: Family Medicine

## 2021-02-24 ENCOUNTER — Ambulatory Visit: Payer: Managed Care, Other (non HMO) | Admitting: Registered"

## 2021-02-24 DIAGNOSIS — J111 Influenza due to unidentified influenza virus with other respiratory manifestations: Secondary | ICD-10-CM

## 2021-02-24 MED ORDER — OSELTAMIVIR PHOSPHATE 75 MG PO CAPS: 75.0000 mg | ORAL_CAPSULE | Freq: Two times a day (BID) | ORAL | 0 refills | Status: DC

## 2021-02-24 NOTE — Progress Notes (Signed)
Patient ID: Terri Cook, female   DOB: 06/06/2000, 20 y.o.   MRN: [id number]  This visit type was conducted due to national recommendations for restrictions regarding the COVID-19 pandemic in an effort to limit this patient's exposure and mitigate transmission in our community.   Virtual Visit via Video Note  I connected with Silveria Botz on 02/24/21 at  3:00 PM EST by a video enabled telemedicine application and verified that I am speaking with the correct person using two identifiers.  Location patient: home Location provider:work or home office Persons participating in the virtual visit: patient, provider  I discussed the limitations of evaluation and management by telemedicine and the availability of in person appointments. The patient expressed understanding and agreed to proceed.   HPI:  Terri Cook relates onset about few days ago some mild nasal congestion.  Sunday is the first day she noticed fever up to 101.2.  She felt even worse yesterday.  She has body aches, intermittent headaches, sore throat, nasal congestion, cough.  She did to home COVID test which were both negative.  She is tried several over-the-counter medications without much relief.  She is concerned specifically about influenza A.  Even though she had some nasal congestion last Thursday and Friday she did not feel as sick until Sunday.  Denies any nausea or vomiting.  No diarrhea.   ROS: See pertinent positives and negatives per HPI.  Past Medical History:  Diagnosis Date   Abnormal uterine bleeding    Acid reflux    Asthma    High blood pressure    High cholesterol    Ingrown left big toenail 09/2016   recurrent   PCOS (polycystic ovarian syndrome)    Seasonal allergies     Past Surgical History:  Procedure Laterality Date   GALLBLADDER SURGERY  2021   LESION EXCISION Left 07/11/2014   Procedure: EXCISION OF BENIGN LESIONS INCLUDING MARGINS ;  Surgeon: Gerald Stabs, MD;  Location: [place];   Service: Pediatrics;  Laterality: Left;  mid back  and right neck   TOENAIL EXCISION  01/27/2012   Procedure: MINOR TOENAIL EXCISION;  Surgeon: Jerilynn Mages. Gerald Stabs, MD;  Location: [place];  Service: Pediatrics;  Laterality: Left;   TOENAIL EXCISION  03/23/2012   Procedure: MINOR TOENAIL EXCISION;  Surgeon: Jerilynn Mages. Gerald Stabs, MD;  Location: [place];  Service: Pediatrics;  Laterality: Right;   TOENAIL EXCISION Right 09/21/2012   Procedure: Partial Excision of Right Toenail;  Surgeon: Jerilynn Mages. Gerald Stabs, MD;  Location: [place];  Service: Pediatrics;  Laterality: Right;   TOENAIL EXCISION Left 02/15/2013   Procedure: PARTIAL TOENAIL EXCISION ON THE LEFT BIG TOE AT THE OUTER LATERAL FOLD WITH PHENOL (MP ROOM) ;  Surgeon: Jerilynn Mages. Gerald Stabs, MD;  Location: [place];  Service: Pediatrics;  Laterality: Left;   TOENAIL EXCISION Left 08/16/2013   Procedure: PARTIAL EXCISION OF LEFT BIG TOE NAIL FROM OUTER LATERAL FOLD;  Surgeon: Jerilynn Mages. Gerald Stabs, MD;  Location: [place];  Service: Pediatrics;  Laterality: Left;   TOENAIL EXCISION Left 09/16/2016   Procedure: PARTIAL PERMANENT EXCISION OF LEFT INGROWN BIG TOENAIL;  Surgeon: Gerald Stabs, MD;  Location: [place];  Service: General;  Laterality: Left;   WISDOM TOOTH EXTRACTION  12/08/2018   all 4    Family History  Problem Relation Age of Onset   Asthma Mother    Diabetes Father    Hypertension Father  Asthma Father    Cirrhosis Paternal Grandmother        caused by medication   Diabetes Paternal Grandmother    Cervical cancer Maternal Grandmother     SOCIAL HX: Non-smoker   Current Outpatient Medications:    oseltamivir (TAMIFLU) 75 MG capsule, Take 1 capsule (75 mg total) by mouth 2 (two) times daily., Disp: 10 capsule, Rfl: 0   Acetaminophen-Caff-Pyrilamine (MIDOL MAX ST MENSTRUAL PO), Take 2 tablets by mouth 2 (two) times daily as  needed (cramps)., Disp: , Rfl:    albuterol (VENTOLIN HFA) 108 (90 Base) MCG/ACT inhaler, Inhale 2 puffs into the lungs every 6 (six) hours as needed for wheezing or shortness of breath., Disp: 8 g, Rfl: 0   Ascorbic Acid (VITAMIN C PO), Take 1,000 mg by mouth at bedtime., Disp: , Rfl:    aspirin-acetaminophen-caffeine (EXCEDRIN EXTRA STRENGTH) 250-250-65 MG tablet, Take 2 tablets by mouth every 8 (eight) hours as needed for headache or migraine., Disp: , Rfl:    BALZIVA 0.4-35 MG-MCG tablet, Take 1 tablet by mouth daily., Disp: 84 tablet, Rfl: 4   benzonatate (TESSALON) 100 MG capsule, Take 1 capsule (100 mg total) by mouth 2 (two) times daily as needed for cough., Disp: 20 capsule, Rfl: 0   cetirizine (ZYRTEC) 10 MG tablet, Take 10 mg by mouth daily as needed for allergies., Disp: , Rfl:    cyclobenzaprine (FLEXERIL) 5 MG tablet, Take 1 tablet (5 mg total) by mouth at bedtime as needed for muscle spasms., Disp: 30 tablet, Rfl: 0   ibuprofen (ADVIL,MOTRIN) 200 MG tablet, Take 400 mg by mouth every 6 (six) hours as needed for mild pain or headache., Disp: , Rfl:    Multiple Vitamins-Minerals (ZINC PO), Take 1 tablet by mouth at bedtime., Disp: , Rfl:    omeprazole (PRILOSEC) 40 MG capsule, Take 1 capsule by mouth twice daily, Disp: 60 capsule, Rfl: 3   valACYclovir (VALTREX) 500 MG tablet, Take one tablet po BID x 3 days with an outbreak, Disp: 30 tablet, Rfl: 2   VITAMIN D PO, Take 1,000 Units by mouth at bedtime., Disp: , Rfl:    Vitamin D, Ergocalciferol, (DRISDOL) 1.25 MG (50000 UNIT) CAPS capsule, Take 1 capsule (50,000 Units total) by mouth every 7 (seven) days., Disp: 12 capsule, Rfl: 0  EXAM:  VITALS per patient if applicable:  GENERAL: alert, oriented, appears well and in no acute distress  HEENT: atraumatic, conjunttiva clear, no obvious abnormalities on inspection of external nose and ears  NECK: normal movements of the head and neck  LUNGS: on inspection no signs of respiratory  distress, breathing rate appears normal, no obvious gross SOB, gasping or wheezing  CV: no obvious cyanosis  MS: moves all visible extremities without noticeable abnormality  PSYCH/NEURO: pleasant and cooperative, no obvious depression or anxiety, speech and thought processing grossly intact  ASSESSMENT AND PLAN:  Discussed the following assessment and plan:  Upper respiratory symptoms.  We did discuss the fact this may be influenza.  We explained there is no way we can know for sure without testing.  Given the fact that her fever started less than 48 hours ago we did discuss possible trial of Tamiflu 75 mg twice daily for 5 days.  Plenty fluids and rest.  Continue Advil or Aleve for body aches and fever  Follow-up for any persistent fever or any worsening symptoms     I discussed the assessment and treatment plan with the patient. The patient was provided an opportunity  to ask questions and all were answered. The patient agreed with the plan and demonstrated an understanding of the instructions.   The patient was advised to call back or seek an in-person evaluation if the symptoms worsen or if the condition fails to improve as anticipated.     Carolann Littler, MD

## 2021-03-11 ENCOUNTER — Encounter: Payer: Self-pay | Admitting: Family Medicine

## 2021-03-11 ENCOUNTER — Ambulatory Visit (INDEPENDENT_AMBULATORY_CARE_PROVIDER_SITE_OTHER): Payer: Managed Care, Other (non HMO) | Admitting: Family Medicine

## 2021-03-11 VITALS — BP 138/82 | HR 110 | Temp 98.7°F | Wt 303.8 lb

## 2021-03-11 DIAGNOSIS — L600 Ingrowing nail: Secondary | ICD-10-CM | POA: Diagnosis not present

## 2021-03-11 DIAGNOSIS — B372 Candidiasis of skin and nail: Secondary | ICD-10-CM

## 2021-03-11 DIAGNOSIS — L03032 Cellulitis of left toe: Secondary | ICD-10-CM

## 2021-03-11 MED ORDER — NYSTATIN-TRIAMCINOLONE 100000-0.1 UNIT/GM-% EX OINT: 1.0000 "application " | TOPICAL_OINTMENT | Freq: Two times a day (BID) | CUTANEOUS | 0 refills | Status: DC

## 2021-03-11 MED ORDER — AMOXICILLIN 500 MG PO TABS: 500.0000 mg | ORAL_TABLET | Freq: Two times a day (BID) | ORAL | 0 refills | Status: AC

## 2021-03-11 NOTE — Progress Notes (Signed)
Subjective:    Patient ID: Terri Cook, female    DOB: [birth year], 20 y.o.   MRN: [id number]  Chief Complaint  Patient presents with   Nail Problem    Big toe on left foot, ingrown toenail, red, swollen, bleeding, may have pus, not sure. Started 1 week  ago, has gotten worse. Tried soaking in hot water and epsom salt, and has tried peroxide.    HPI Patient was seen today for acute concern.  Pt with pain, erythema, and drainage or L great toe due to ingrown toenail.  Patient endorses soaking toe in Epson salt and warm water, hydrogen peroxide.  Patient denies wearing tight fitting shoes.  Patient also mentions intermittent red bumps that appear in axilla.  Patient states the itch.  Endorses increased sweating while at work.  Showering twice a day.  Not really present at this time.  Past Medical History:  Diagnosis Date   Abnormal uterine bleeding    Acid reflux    Asthma    High blood pressure    High cholesterol    Ingrown left big toenail 09/2016   recurrent   PCOS (polycystic ovarian syndrome)    Seasonal allergies     Allergies  Allergen Reactions   Clindamycin/Lincomycin Anaphylaxis    Trouble breathing?    ROS General: Denies fever, chills, night sweats, changes in weight, changes in appetite HEENT: Denies headaches, ear pain, changes in vision, rhinorrhea, sore throat CV: Denies CP, palpitations, SOB, orthopnea Pulm: Denies SOB, cough, wheezing GI: Denies abdominal pain, nausea, vomiting, diarrhea, constipation GU: Denies dysuria, hematuria, frequency, vaginal discharge Msk: Denies muscle cramps, joint pains Neuro: Denies weakness, numbness, tingling Skin: Denies rashes, bruising  + rash, painful left great toe with drainage Psych: Denies depression, anxiety, hallucinations     Objective:    Blood pressure 138/82, pulse (!) 110, temperature 98.7 F (37.1 C), temperature source Oral, weight (!) 303 lb 12.8 oz (137.8 kg), SpO2 99 %.  Gen. Pleasant,  well-nourished, in no distress, normal affect   HEENT: [place]/AT, face symmetric, conjunctiva clear, no scleral icterus, PERRLA, EOMI, nares patent without drainage Lungs: no accessory muscle use, CTAB, no wheezes or rales Cardiovascular: Tachycardia, , no peripheral edema Musculoskeletal: No deformities, no cyanosis or clubbing, normal tone Neuro:  A&Ox3, CN II-XII intact, normal gait Skin:  Warm, intact.  Erythematous raised bumps axilla.  Left great toe with erythema, purulent drainage expressed along medial edge of toe nail.  Medial edge of toenail digging into skin of toe.   Wt Readings from Last 3 Encounters:  03/11/21 (!) 303 lb 12.8 oz (137.8 kg)  01/13/21 (!) 309 lb (140.2 kg)  01/09/21 (!) 307 lb 12.8 oz (139.6 kg)    Lab Results  Component Value Date   WBC 10.6 (H) 01/09/2021   HGB 13.2 01/09/2021   HCT 40.5 01/09/2021   PLT 416.0 (H) 01/09/2021   GLUCOSE 92 01/09/2021   CHOL 205 (H) 01/09/2021   TRIG 127.0 01/09/2021   HDL 47.90 01/09/2021   LDLCALC 132 (H) 01/09/2021   ALT 57 (H) 01/09/2021   AST 28 01/09/2021   NA 139 01/09/2021   K 3.8 01/09/2021   CL 101 01/09/2021   CREATININE 0.64 01/09/2021   BUN 5 (L) 01/09/2021   CO2 28 01/09/2021   TSH 2.99 01/09/2021   HGBA1C 5.8 01/09/2021    Assessment/Plan:  Ingrown nail of great toe of left foot -Reported care including wearing shoes with enough room in the toebox, avoid cutting  edge of toenail -Continue soaking foot/toe in Epson salt and warm water -We will place referral to podiatry for ingrown toenail removal. -We will start antibiotics 2/2 infection. -Given handout - Plan: Ambulatory referral to Podiatry  Infection of nail bed of toe of left foot -Discussed supportive care - Plan: amoxicillin (AMOXIL) 500 MG tablet, Ambulatory referral to Podiatry  Yeast dermatitis - Plan: nystatin-triamcinolone ointment (MYCOLOG)  F/u as needed  Grier Mitts, MD

## 2021-03-13 ENCOUNTER — Other Ambulatory Visit: Payer: Self-pay | Admitting: Podiatry

## 2021-03-13 ENCOUNTER — Encounter: Payer: Self-pay | Admitting: Podiatry

## 2021-03-13 ENCOUNTER — Other Ambulatory Visit: Payer: Self-pay

## 2021-03-13 ENCOUNTER — Ambulatory Visit (INDEPENDENT_AMBULATORY_CARE_PROVIDER_SITE_OTHER): Payer: Managed Care, Other (non HMO) | Admitting: Podiatry

## 2021-03-13 DIAGNOSIS — L608 Other nail disorders: Secondary | ICD-10-CM

## 2021-03-13 DIAGNOSIS — L03032 Cellulitis of left toe: Secondary | ICD-10-CM | POA: Diagnosis not present

## 2021-03-13 DIAGNOSIS — L600 Ingrowing nail: Secondary | ICD-10-CM | POA: Diagnosis not present

## 2021-03-13 NOTE — Patient Instructions (Signed)
Finish the current antibiotics  Soak Instructions    THE DAY AFTER THE PROCEDURE  Place 1/4 cup of epsom salts in a quart of warm tap water.  Submerge your foot or feet with outer bandage intact for the initial soak; this will allow the bandage to become moist and wet for easy lift off.  Once you remove your bandage, continue to soak in the solution for 20 minutes.  This soak should be done twice a day.  Next, remove your foot or feet from solution, blot dry the affected area and cover.  You may use a band aid large enough to cover the area or use gauze and tape.  Apply other medications to the area as directed by the doctor such as polysporin neosporin.  IF YOUR SKIN BECOMES IRRITATED WHILE USING THESE INSTRUCTIONS, IT IS OKAY TO SWITCH TO  WHITE VINEGAR AND WATER. Or you may use antibacterial soap and water to keep the toe clean  Monitor for any signs/symptoms of infection. Call the office immediately if any occur or go directly to the emergency room. Call with any questions/concerns.

## 2021-03-16 ENCOUNTER — Other Ambulatory Visit: Payer: Self-pay

## 2021-03-16 ENCOUNTER — Emergency Department (HOSPITAL_COMMUNITY)
Admission: EM | Admit: 2021-03-16 | Discharge: 2021-03-16 | Disposition: A | Discharge status: Home / Self Care | Payer: Managed Care, Other (non HMO) | Attending: Emergency Medicine | Admitting: Emergency Medicine

## 2021-03-16 ENCOUNTER — Encounter: Payer: Self-pay | Admitting: Obstetrics and Gynecology

## 2021-03-16 ENCOUNTER — Emergency Department (HOSPITAL_COMMUNITY): Payer: Managed Care, Other (non HMO)

## 2021-03-16 ENCOUNTER — Encounter (HOSPITAL_COMMUNITY): Payer: Self-pay

## 2021-03-16 ENCOUNTER — Ambulatory Visit (INDEPENDENT_AMBULATORY_CARE_PROVIDER_SITE_OTHER): Payer: Managed Care, Other (non HMO) | Admitting: Obstetrics and Gynecology

## 2021-03-16 ENCOUNTER — Telehealth: Payer: Self-pay | Admitting: *Deleted

## 2021-03-16 VITALS — BP 110/80 | HR 77 | Ht 68.0 in | Wt 300.0 lb

## 2021-03-16 DIAGNOSIS — R109 Unspecified abdominal pain: Secondary | ICD-10-CM | POA: Diagnosis not present

## 2021-03-16 DIAGNOSIS — N921 Excessive and frequent menstruation with irregular cycle: Secondary | ICD-10-CM

## 2021-03-16 DIAGNOSIS — Z113 Encounter for screening for infections with a predominantly sexual mode of transmission: Secondary | ICD-10-CM | POA: Diagnosis not present

## 2021-03-16 DIAGNOSIS — J45909 Unspecified asthma, uncomplicated: Secondary | ICD-10-CM | POA: Insufficient documentation

## 2021-03-16 DIAGNOSIS — R1031 Right lower quadrant pain: Secondary | ICD-10-CM | POA: Diagnosis present

## 2021-03-16 DIAGNOSIS — Z7982 Long term (current) use of aspirin: Secondary | ICD-10-CM | POA: Diagnosis not present

## 2021-03-16 DIAGNOSIS — Z79899 Other long term (current) drug therapy: Secondary | ICD-10-CM | POA: Diagnosis not present

## 2021-03-16 DIAGNOSIS — R102 Pelvic and perineal pain: Secondary | ICD-10-CM

## 2021-03-16 LAB — WOUND CULTURE
MICRO NUMBER:: 12738004
SPECIMEN QUALITY:: ADEQUATE

## 2021-03-16 LAB — PREGNANCY, URINE: Preg Test, Ur: NEGATIVE

## 2021-03-16 LAB — URINALYSIS, ROUTINE W REFLEX MICROSCOPIC
Glucose, UA: NEGATIVE mg/dL
Ketones, ur: NEGATIVE mg/dL
Leukocytes,Ua: NEGATIVE
Nitrite: NEGATIVE
Protein, ur: 100 mg/dL — AB
Specific Gravity, Urine: 1.02 (ref 1.005–1.030)
pH: 5 (ref 5.0–8.0)

## 2021-03-16 LAB — COMPREHENSIVE METABOLIC PANEL
ALT: 49 U/L — ABNORMAL HIGH (ref 0–44)
AST: 28 U/L (ref 15–41)
Albumin: 3.4 g/dL — ABNORMAL LOW (ref 3.5–5.0)
Alkaline Phosphatase: 64 U/L (ref 38–126)
Anion gap: 10 (ref 5–15)
BUN: 8 mg/dL (ref 6–20)
CO2: 23 mmol/L (ref 22–32)
Calcium: 9.1 mg/dL (ref 8.9–10.3)
Chloride: 105 mmol/L (ref 98–111)
Creatinine, Ser: 0.71 mg/dL (ref 0.44–1.00)
GFR, Estimated: >60 mL/min (ref >60)
Glucose, Bld: 107 mg/dL — ABNORMAL HIGH (ref 70–99)
Potassium: 3.6 mmol/L (ref 3.5–5.1)
Sodium: 138 mmol/L (ref 135–145)
Total Bilirubin: 0.6 mg/dL (ref 0.3–1.2)
Total Protein: 6.4 g/dL — ABNORMAL LOW (ref 6.5–8.1)

## 2021-03-16 LAB — CBC
HCT: 41.9 % (ref 36.0–46.0)
Hemoglobin: 13.4 g/dL (ref 12.0–15.0)
MCH: 24.1 pg — ABNORMAL LOW (ref 26.0–34.0)
MCHC: 32 g/dL (ref 30.0–36.0)
MCV: 75.2 fL — ABNORMAL LOW (ref 80.0–100.0)
Platelets: 489 10*3/uL — ABNORMAL HIGH (ref 150–400)
RBC: 5.57 MIL/uL — ABNORMAL HIGH (ref 3.87–5.11)
RDW: 13.9 % (ref 11.5–15.5)
WBC: 12.2 10*3/uL — ABNORMAL HIGH (ref 4.0–10.5)
nRBC: 0 % (ref 0.0–0.2)

## 2021-03-16 LAB — HOUSE ACCOUNT TRACKING

## 2021-03-16 LAB — LIPASE, BLOOD: Lipase: 28 U/L (ref 11–51)

## 2021-03-16 LAB — I-STAT BETA HCG BLOOD, ED (MC, WL, AP ONLY): I-stat hCG, quantitative: <5 m[IU]/mL (ref <5)

## 2021-03-16 LAB — URINALYSIS, MICROSCOPIC (REFLEX): RBC / HPF: >50 RBC/hpf (ref 0–5)

## 2021-03-16 MED ORDER — IOHEXOL 300 MG/ML  SOLN
100.0000 mL | Freq: Once | INTRAMUSCULAR | Status: AC | PRN
  Administered 2021-03-16: 100 mL via INTRAVENOUS

## 2021-03-16 MED ORDER — SODIUM CHLORIDE 0.9% FLUSH: 3.0000 mL | Freq: Once | INTRAVENOUS | Status: DC

## 2021-03-16 NOTE — ED Provider Notes (Signed)
[facility] EMERGENCY DEPARTMENT Provider Note   CSN: [id number] Arrival date & time: 03/16/21  1326     History Chief Complaint  Patient presents with   Abdominal Pain    Terri Cook is a 20 y.o. female.  The history is provided by the patient and medical records.  Abdominal Pain Pain location:  RLQ Pain quality: sharp   Pain radiates to:  Does not radiate Pain severity:  Moderate Onset quality:  Sudden Duration:  1 day Timing:  Intermittent Progression:  Waxing and waning Context comment:  Newly diagnosed PCOS Relieved by:  Nothing Worsened by:  Nothing Ineffective treatments:  None tried Associated symptoms: no chest pain, no chills, no cough, no dysuria, no fever, no hematuria, no shortness of breath, no sore throat and no vomiting       Past Medical History:  Diagnosis Date   Abnormal uterine bleeding    Acid reflux    Asthma    High blood pressure    High cholesterol    Ingrown left big toenail 09/2016   recurrent   PCOS (polycystic ovarian syndrome)    Seasonal allergies     Patient Active Problem List   Diagnosis Date Noted   HSV-2 infection 01/09/2021   PCOS (polycystic ovarian syndrome)    Dysmetabolic syndrome 70/26/3785   Increased BMI 07/17/2017   Melanocytic nevus 07/17/2017   Hypovitaminosis D 09/04/2015   Hypertriglyceridemia 04/10/2015   Acanthosis 04/10/2015   Insulin resistance 04/10/2015   Menorrhagia 04/10/2015   CHICKENPOX, HX OF 08/03/2006    Past Surgical History:  Procedure Laterality Date   GALLBLADDER SURGERY  2021   LESION EXCISION Left 07/11/2014   Procedure: EXCISION OF BENIGN LESIONS INCLUDING MARGINS ;  Surgeon: Gerald Stabs, MD;  Location: [place];  Service: Pediatrics;  Laterality: Left;  mid back  and right neck   TOENAIL EXCISION  01/27/2012   Procedure: MINOR TOENAIL EXCISION;  Surgeon: Jerilynn Mages. Gerald Stabs, MD;  Location: [place];  Service: Pediatrics;   Laterality: Left;   TOENAIL EXCISION  03/23/2012   Procedure: MINOR TOENAIL EXCISION;  Surgeon: Jerilynn Mages. Gerald Stabs, MD;  Location: [place];  Service: Pediatrics;  Laterality: Right;   TOENAIL EXCISION Right 09/21/2012   Procedure: Partial Excision of Right Toenail;  Surgeon: Jerilynn Mages. Gerald Stabs, MD;  Location: [place];  Service: Pediatrics;  Laterality: Right;   TOENAIL EXCISION Left 02/15/2013   Procedure: PARTIAL TOENAIL EXCISION ON THE LEFT BIG TOE AT THE OUTER LATERAL FOLD WITH PHENOL (MP ROOM) ;  Surgeon: Jerilynn Mages. Gerald Stabs, MD;  Location: [place];  Service: Pediatrics;  Laterality: Left;   TOENAIL EXCISION Left 08/16/2013   Procedure: PARTIAL EXCISION OF LEFT BIG TOE NAIL FROM OUTER LATERAL FOLD;  Surgeon: Jerilynn Mages. Gerald Stabs, MD;  Location: [place];  Service: Pediatrics;  Laterality: Left;   TOENAIL EXCISION Left 09/16/2016   Procedure: PARTIAL PERMANENT EXCISION OF LEFT INGROWN BIG TOENAIL;  Surgeon: Gerald Stabs, MD;  Location: [place];  Service: General;  Laterality: Left;   WISDOM TOOTH EXTRACTION  12/08/2018   all 4     OB History     Gravida  0   Para  0   Term  0   Preterm  0   AB  0   Living  0      SAB  0   IAB  0   Ectopic  0  Multiple  0   Live Births  0           Family History  Problem Relation Age of Onset   Asthma Mother    Diabetes Father    Hypertension Father    Asthma Father    Cirrhosis Paternal Grandmother        caused by medication   Diabetes Paternal Grandmother    Cervical cancer Maternal Grandmother     Social History   Tobacco Use   Smoking status: Never   Smokeless tobacco: Never  Vaping Use   Vaping Use: Never used  Substance Use Topics   Alcohol use: No   Drug use: No    Home Medications Prior to Admission medications   Medication Sig Start Date End Date Taking? Authorizing Provider  Acetaminophen-Caff-Pyrilamine (MIDOL  MAX ST MENSTRUAL PO) Take 2 tablets by mouth 2 (two) times daily as needed (cramps).   Yes [provider]  albuterol (VENTOLIN HFA) 108 (90 Base) MCG/ACT inhaler Inhale 2 puffs into the lungs every 6 (six) hours as needed for wheezing or shortness of breath. 05/05/20  Yes Billie Ruddy, MD  amoxicillin (AMOXIL) 500 MG tablet Take 1 tablet (500 mg total) by mouth 2 (two) times daily for 7 days. Patient taking differently: Take 500 mg by mouth 2 (two) times daily. Start date : 03/11/21 03/11/21 03/18/21 Yes Billie Ruddy, MD  Ascorbic Acid (VITAMIN C PO) Take 1,000 mg by mouth daily.   Yes [provider]  aspirin-acetaminophen-caffeine (EXCEDRIN EXTRA STRENGTH) [phone] MG tablet Take 2 tablets by mouth every 8 (eight) hours as needed for headache or migraine.   Yes [provider]  Hulda Humphrey 0.4-35 MG-MCG tablet Take 1 tablet by mouth daily. 01/13/21  Yes Salvadore Dom, MD  cetirizine (ZYRTEC) 10 MG tablet Take 10 mg by mouth daily as needed for allergies.   Yes [provider]  cholecalciferol (VITAMIN D3) 25 MCG (1000 UNIT) tablet Take 1,000 Units by mouth daily.   Yes [provider]  ibuprofen (ADVIL,MOTRIN) 200 MG tablet Take 400 mg by mouth every 6 (six) hours as needed for mild pain or headache.   Yes [provider]  nystatin-triamcinolone ointment (MYCOLOG) Apply 1 application topically 2 (two) times daily. 03/11/21  Yes Billie Ruddy, MD  omeprazole (PRILOSEC) 40 MG capsule Take 1 capsule by mouth twice daily Patient taking differently: 40 mg daily. 02/20/21  Yes Billie Ruddy, MD  zinc gluconate 50 MG tablet Take 50 mg by mouth daily.   Yes [provider]  valACYclovir (VALTREX) 500 MG tablet Take one tablet po BID x 3 days with an outbreak Patient not taking: Reported on 03/16/2021 12/25/20   Salvadore Dom, MD  Vitamin D, Ergocalciferol, (DRISDOL) 1.25 MG (50000 UNIT) CAPS capsule Take 1 capsule (50,000  Units total) by mouth every 7 (seven) days. Patient not taking: Reported on 03/16/2021 01/09/21   Billie Ruddy, MD  pantoprazole (PROTONIX) 40 MG tablet Take 40 mg by mouth daily. 12/24/19 08/14/20  [provider]    Allergies    Clindamycin/lincomycin  Review of Systems   Review of Systems  Constitutional:  Negative for chills and fever.  HENT:  Negative for ear pain and sore throat.   Eyes:  Negative for pain and visual disturbance.  Respiratory:  Negative for cough and shortness of breath.   Cardiovascular:  Negative for chest pain and palpitations.  Gastrointestinal:  Positive for abdominal pain. Negative for vomiting.  Genitourinary:  Negative for dysuria and hematuria.  Musculoskeletal:  Negative for arthralgias and back pain.  Skin:  Negative for color change and rash.  Neurological:  Negative for seizures and syncope.  All other systems reviewed and are negative.  Physical Exam Updated Vital Signs BP 122/80   Pulse 80   Temp 98.3 F (36.8 C) (Oral)   Resp 18   Ht 5\' 8"  (1.727 m)   Wt 136.1 kg   LMP 03/15/2021   SpO2 99%   BMI 45.61 kg/m   Physical Exam Vitals and nursing note reviewed.  Constitutional:      General: She is not in acute distress.    Appearance: Normal appearance. She is well-developed. She is obese.  HENT:     Head: Normocephalic and atraumatic.     Right Ear: External ear normal.     Left Ear: External ear normal.     Nose: Nose normal. No congestion or rhinorrhea.     Mouth/Throat:     Mouth: Mucous membranes are moist.  Eyes:     Extraocular Movements: Extraocular movements intact.     Conjunctiva/sclera: Conjunctivae normal.     Pupils: Pupils are equal, round, and reactive to light.  Cardiovascular:     Rate and Rhythm: Normal rate and regular rhythm.     Pulses: Normal pulses.     Heart sounds: No murmur heard. Pulmonary:     Effort: Pulmonary effort is normal. No respiratory distress.     Breath sounds: Normal breath  sounds. No wheezing, rhonchi or rales.  Abdominal:     General: Abdomen is flat. Bowel sounds are normal.     Palpations: Abdomen is soft.     Tenderness: There is abdominal tenderness (Very mild throughout right lower abdomen, nonfocal). There is no guarding or rebound.  Musculoskeletal:        General: No swelling, tenderness or deformity.     Cervical back: Normal range of motion and neck supple. No rigidity.  Skin:    General: Skin is warm and dry.     Capillary Refill: Capillary refill takes less than 2 seconds.  Neurological:     General: No focal deficit present.     Mental Status: She is alert and oriented to person, place, and time.  Psychiatric:        Mood and Affect: Mood normal.    ED Results / Procedures / Treatments   Labs (all labs ordered are listed, but only abnormal results are displayed) Labs Reviewed  COMPREHENSIVE METABOLIC PANEL - Abnormal; Notable for the following components:      Result Value   Glucose, Bld 107 (*)    Total Protein 6.4 (*)    Albumin 3.4 (*)    ALT 49 (*)    All other components within normal limits  CBC - Abnormal; Notable for the following components:   WBC 12.2 (*)    RBC 5.57 (*)    MCV 75.2 (*)    MCH 24.1 (*)    Platelets 489 (*)    All other components within normal limits  URINALYSIS, ROUTINE W REFLEX MICROSCOPIC - Abnormal; Notable for the following components:   APPearance CLOUDY (*)    Hgb urine dipstick LARGE (*)    Bilirubin Urine SMALL (*)    Protein, ur 100 (*)    All other components within normal limits  URINALYSIS, MICROSCOPIC (REFLEX) - Abnormal; Notable for the following components:   Bacteria, UA RARE (*)    All  other components within normal limits  LIPASE, BLOOD  I-STAT BETA HCG BLOOD, ED ([facility], [facility], [facility] ONLY)    EKG None  Radiology CT ABDOMEN PELVIS W CONTRAST  Result Date: 03/16/2021 CLINICAL DATA:  Right lower quadrant abdominal pain EXAM: CT ABDOMEN AND PELVIS WITH CONTRAST TECHNIQUE:  Multidetector CT imaging of the abdomen and pelvis was performed using the standard protocol following bolus administration of intravenous contrast. CONTRAST:  167mL OMNIPAQUE IOHEXOL 300 MG/ML  SOLN COMPARISON:  None. FINDINGS: Lower chest: No acute abnormality. Hepatobiliary: No focal liver abnormality is seen. Status post cholecystectomy. No biliary dilatation. Pancreas: Unremarkable. No pancreatic ductal dilatation or surrounding inflammatory changes. Spleen: Normal in size without significant abnormality. Adrenals/Urinary Tract: Adrenal glands are unremarkable. Kidneys are normal, without renal calculi, solid lesion, or hydronephrosis. Bladder is unremarkable. Stomach/Bowel: Stomach is within normal limits. There is an appendicolith in the distal appendix, which is normal in caliber without evidence of inflammation (series 4, image 78, series 7, image 73). No evidence of bowel wall thickening, distention, or inflammatory changes. Vascular/Lymphatic: No significant vascular findings are present. No enlarged abdominal or pelvic lymph nodes. Reproductive: No mass or other significant abnormality. Small ovarian follicles. Other: No abdominal wall hernia or abnormality. Trace free fluid in the low pelvis. Musculoskeletal: No acute or significant osseous findings. IMPRESSION: 1. No CT findings of the abdomen or pelvis to explain right lower quadrant pain. 2. There is an appendicolith in the distal appendix, which is however normal in caliber without evidence of inflammation. 3. Trace free fluid in the low pelvis, nonspecific and likely functional in the reproductive age setting. 4. Status post cholecystectomy. Electronically Signed   By: Delanna Ahmadi M.D.   On: 03/16/2021 18:18   US PELVIC COMPLETE W TRANSVAGINAL AND TORSION R/O  Result Date: 03/16/2021 CLINICAL DATA:  Right lower quadrant pain EXAM: TRANSABDOMINAL AND TRANSVAGINAL ULTRASOUND OF PELVIS DOPPLER ULTRASOUND OF OVARIES TECHNIQUE: Both  transabdominal and transvaginal ultrasound examinations of the pelvis were performed. Transabdominal technique was performed for global imaging of the pelvis including uterus, ovaries, adnexal regions, and pelvic cul-de-sac. It was necessary to proceed with endovaginal exam following the transabdominal exam to visualize the uterus and bilateral ovaries. Color and duplex Doppler ultrasound was utilized to evaluate blood flow to the ovaries. COMPARISON:  CT abdomen/pelvis dated 03/16/2021 FINDINGS: Uterus Measurements: 7.4 x 3.8 x 5.2 cm = volume: 77.7 mL. No fibroids or other mass visualized. Endometrium Thickness: 9 mm.  No focal abnormality visualized. Right ovary Measurements: 3.0 x 2.2 x 2.0 cm = volume: 6.8 mL. Normal appearance/no adnexal mass. Left ovary Measurements: 3.4 x 2.3 x 2.6 cm = volume: 10.6 mL. Normal appearance/no adnexal mass. Pulsed Doppler evaluation of both ovaries demonstrates normal low-resistance arterial and venous waveforms. Other findings Trace pelvic fluid, physiologic. IMPRESSION: Negative pelvic ultrasound. No evidence of ovarian torsion. Electronically Signed   By: Julian Hy M.D.   On: 03/16/2021 22:27    Procedures Procedures   Medications Ordered in ED Medications  sodium chloride flush (NS) 0.9 % injection 3 mL (has no administration in time range)  iohexol (OMNIPAQUE) 300 MG/ML solution 100 mL (100 mLs Intravenous Contrast Given 03/16/21 1811)    ED Course  I have reviewed the triage vital signs and the nursing notes.  Pertinent labs & imaging results that were available during my care of the patient were reviewed by me and considered in my medical decision making (see chart for details).    MDM Rules/Calculators/A&P  20 year old female with a history of PCOS on hormonal birth control presenting with colicky right lower quadrant pain over the course the past day.  Abdominal exam as above.  She has no rebound or guarding.  CBC  showed a mildly elevated white count to 12, which is nonspecific.  She is having no other infectious symptoms.  She is afebrile here.  Her hemoglobin is stable.  Her lipase is normal.  Low concern for pancreatitis.  Beta quant negative.  Low concern for IUP or ectopic pregnancy.  Electrolytes normal.  Creatinine normal.  UA showed no signs of acute infection.  Low concern for UTI or pyelo.  CT abdomen pelvis showed no signs of appendicitis or other intra-abdominal infection.  Pelvic ultrasound obtained and showed no evidence of ovarian torsion or other ovarian pathology.  Patient reassessed.  Resting comfortably.She is tolerating p.o.  Abdomen soft and nonperitonitic. Pain may be related to menstrual cycle w/ known hx of PCOS.  Symptomatic management discussed. Appropriate for discharge home with close follow-up with PCP and OB/GYN.  Strict return precautions provided, including worsening abdominal pain, fevers, nausea, vomiting, poor appetite.  Final Clinical Impression(s) / ED Diagnoses Final diagnoses:  Pelvic pain    Rx / DC Orders ED Discharge Orders     None        Idamae Lusher, MD 03/17/21 0000    Margette Fast, MD 03/19/21 [phone]

## 2021-03-16 NOTE — ED Provider Notes (Signed)
Emergency Medicine Provider Triage Evaluation Note  Terri Cook , a 20 y.o. female  was evaluated in triage.  Pt complains of right lower quadrant abdominal pain since yesterday.  Was seen by her OB/GYN this morning as she normally has regular periods but has had amenorrhea for the last 2 months.  Pain got worse today.  Negative pregnancy test this morning.  Reports associated intractable nausea and vomiting.  No fever or chills.  Review of Systems  Positive:  Negative: See above   Physical Exam  BP 134/67 (BP Location: Right Arm)   Pulse 90   Temp 98.3 F (36.8 C) (Oral)   Resp 14   Ht 5\' 8"  (1.727 m)   Wt 136.1 kg   LMP 03/15/2021   SpO2 99%   BMI 45.61 kg/m  Gen:   Awake, no distress   Resp:  Normal effort  MSK:   Moves extremities without difficulty  Other:  Moderate right lower quadrant abdominal tenderness  Medical Decision Making  Medically screening exam initiated at 2:45 PM.  Appropriate orders placed.  [name] was informed that the remainder of the evaluation will be completed by another provider, this initial triage assessment does not replace that evaluation, and the importance of remaining in the ED until their evaluation is complete.     Myna Bright [place], PA-C 03/16/21 1446    Milton Ferguson, MD 03/16/21 [phone]

## 2021-03-16 NOTE — ED Notes (Signed)
Patient transported to Ultrasound 

## 2021-03-16 NOTE — Progress Notes (Signed)
Subjective:   Patient ID: Terri Cook, female   DOB: 20 y.o.   MRN: [id number]   HPI 20 year old female presents the office with concerns of ingrown toenail left great toe, medial aspect.  She states red swollen painful.  She has some drainage from the area.  No red streaks.  States that she was recently prescribed antibiotic but she did not start taking it yet.  She has no other concerns.   Review of Systems  All other systems reviewed and are negative.  Past Medical History:  Diagnosis Date   Abnormal uterine bleeding    Acid reflux    Asthma    High blood pressure    High cholesterol    Ingrown left big toenail 09/2016   recurrent   PCOS (polycystic ovarian syndrome)    Seasonal allergies     Past Surgical History:  Procedure Laterality Date   GALLBLADDER SURGERY  2021   LESION EXCISION Left 07/11/2014   Procedure: EXCISION OF BENIGN LESIONS INCLUDING MARGINS ;  Surgeon: Gerald Stabs, MD;  Location: [place];  Service: Pediatrics;  Laterality: Left;  mid back  and right neck   TOENAIL EXCISION  01/27/2012   Procedure: MINOR TOENAIL EXCISION;  Surgeon: Jerilynn Mages. Gerald Stabs, MD;  Location: [place];  Service: Pediatrics;  Laterality: Left;   TOENAIL EXCISION  03/23/2012   Procedure: MINOR TOENAIL EXCISION;  Surgeon: Jerilynn Mages. Gerald Stabs, MD;  Location: [place];  Service: Pediatrics;  Laterality: Right;   TOENAIL EXCISION Right 09/21/2012   Procedure: Partial Excision of Right Toenail;  Surgeon: Jerilynn Mages. Gerald Stabs, MD;  Location: [place];  Service: Pediatrics;  Laterality: Right;   TOENAIL EXCISION Left 02/15/2013   Procedure: PARTIAL TOENAIL EXCISION ON THE LEFT BIG TOE AT THE OUTER LATERAL FOLD WITH PHENOL (MP ROOM) ;  Surgeon: Jerilynn Mages. Gerald Stabs, MD;  Location: [place];  Service: Pediatrics;  Laterality: Left;   TOENAIL EXCISION Left 08/16/2013   Procedure: PARTIAL EXCISION OF LEFT BIG TOE  NAIL FROM OUTER LATERAL FOLD;  Surgeon: Jerilynn Mages. Gerald Stabs, MD;  Location: [place];  Service: Pediatrics;  Laterality: Left;   TOENAIL EXCISION Left 09/16/2016   Procedure: PARTIAL PERMANENT EXCISION OF LEFT INGROWN BIG TOENAIL;  Surgeon: Gerald Stabs, MD;  Location: [place];  Service: General;  Laterality: Left;   WISDOM TOOTH EXTRACTION  12/08/2018   all 4     Current Outpatient Medications:    Acetaminophen-Caff-Pyrilamine (MIDOL MAX ST MENSTRUAL PO), Take 2 tablets by mouth 2 (two) times daily as needed (cramps)., Disp: , Rfl:    albuterol (VENTOLIN HFA) 108 (90 Base) MCG/ACT inhaler, Inhale 2 puffs into the lungs every 6 (six) hours as needed for wheezing or shortness of breath., Disp: 8 g, Rfl: 0   amoxicillin (AMOXIL) 500 MG tablet, Take 1 tablet (500 mg total) by mouth 2 (two) times daily for 7 days., Disp: 14 tablet, Rfl: 0   Ascorbic Acid (VITAMIN C PO), Take 1,000 mg by mouth at bedtime., Disp: , Rfl:    aspirin-acetaminophen-caffeine (EXCEDRIN EXTRA STRENGTH) 250-250-65 MG tablet, Take 2 tablets by mouth every 8 (eight) hours as needed for headache or migraine., Disp: , Rfl:    BALZIVA 0.4-35 MG-MCG tablet, Take 1 tablet by mouth daily., Disp: 84 tablet, Rfl: 4   benzonatate (TESSALON) 100 MG capsule, Take 1 capsule (100 mg total) by mouth 2 (two) times daily as needed for cough., Disp:  20 capsule, Rfl: 0   cetirizine (ZYRTEC) 10 MG tablet, Take 10 mg by mouth daily as needed for allergies., Disp: , Rfl:    cyclobenzaprine (FLEXERIL) 5 MG tablet, Take 1 tablet (5 mg total) by mouth at bedtime as needed for muscle spasms., Disp: 30 tablet, Rfl: 0   ibuprofen (ADVIL,MOTRIN) 200 MG tablet, Take 400 mg by mouth every 6 (six) hours as needed for mild pain or headache., Disp: , Rfl:    Multiple Vitamins-Minerals (ZINC PO), Take 1 tablet by mouth at bedtime., Disp: , Rfl:    nystatin-triamcinolone ointment (MYCOLOG), Apply 1 application topically 2  (two) times daily., Disp: 30 g, Rfl: 0   omeprazole (PRILOSEC) 40 MG capsule, Take 1 capsule by mouth twice daily, Disp: 60 capsule, Rfl: 3   oseltamivir (TAMIFLU) 75 MG capsule, Take 1 capsule (75 mg total) by mouth 2 (two) times daily., Disp: 10 capsule, Rfl: 0   valACYclovir (VALTREX) 500 MG tablet, Take one tablet po BID x 3 days with an outbreak, Disp: 30 tablet, Rfl: 2   VITAMIN D PO, Take 1,000 Units by mouth at bedtime., Disp: , Rfl:    Vitamin D, Ergocalciferol, (DRISDOL) 1.25 MG (50000 UNIT) CAPS capsule, Take 1 capsule (50,000 Units total) by mouth every 7 (seven) days., Disp: 12 capsule, Rfl: 0  Allergies  Allergen Reactions   Clindamycin/Lincomycin Anaphylaxis    Trouble breathing?      Objective:  Physical Exam  General: AAO x3, NAD  Dermatological: Incurvation present to medial aspect left hallux toenail localized edema and erythema.  Small amount of purulence was identified which was cultured today.  See procedure note below.  No ascending cellulitis.  No fluctuation or crepitation.  Vascular: Dorsalis Pedis artery and Posterior Tibial artery pedal pulses are 2/4 bilateral with immedate capillary fill time.  There is no pain with calf compression, swelling, warmth, erythema.   Neruologic: Grossly intact via light touch bilateral.   Musculoskeletal: Tenderness to the left medial hallux toenail.  No other areas of discomfort.  Muscular strength 5/5 in all groups tested bilateral.  Gait: Unassisted, Nonantalgic.       Assessment:   Left medial hallux ingrown toenail with localized infection, paronychia     Plan:  -Treatment options discussed including all alternatives, risks, and complications --Etiology of symptoms were discussed -At this time, recommended partial nail removal without chemical matricectomy to the left medial due to infection. Risks and complications were discussed with the patient for which they understand and  verbally consent to the procedure.  Under sterile conditions a total of 3 mL of a mixture of 2% lidocaine plain and 0.5% Marcaine plain was infiltrated in a hallux block fashion. Once anesthetized, the skin was prepped in sterile fashion. A tourniquet was then applied. Next the median border of the hallux nail border was sharply excised making sure to remove the entire offending nail border.  Small purulence was identified which was cultured today.  Once the nail was removed, the area was debrided and the underlying skin was intact.  No further purulence identified.  The area was irrigated and hemostasis was obtained.  A dry sterile dressing was applied. After application of the dressing the tourniquet was removed and there is found to be an immediate capillary refill time to the digit. The patient tolerated the procedure well any complications. Post procedure instructions were discussed the patient for which he verbally understood. Follow-up in one week for nail check or sooner if any problems are to  arise. Discussed signs/symptoms of worsening infection and directed to call the office immediately should any occur or go directly to the emergency room. In the meantime, encouraged to call the office with any questions, concerns, changes symptoms. -Start antibiotics that were prescribed  Trula Slade DPM

## 2021-03-16 NOTE — Telephone Encounter (Signed)
Call returned to patient to schedule PUS at outside facility.   Spoke with patient. Patient states she is going to ER today for further evaluation, declines to schedule PUS at this time. Patient is aware to contact office if additional f/u needed or if PUS is still needed. Patient verbalizes understanding.   Routing to provider for final review. Patient is agreeable to disposition. Will close encounter.

## 2021-03-16 NOTE — Progress Notes (Signed)
GYNECOLOGY  VISIT   HPI: 20 y.o.   Single White or Caucasian Not Hispanic or Latino  female   G0P0000 with No LMP recorded.   here for abnormal bleeding on cyclic OCP's. Patient states that she last had a period in October, no bleeding again until yesterday. She is due for her cycle next week. The bleeding isn't heavy, light flow.  She is having severe abdominal pain, yesterday it was even worse. She had emesis 7 times yesterday. No fevers. Pain is intermittent, initially diffuse, today in the RLQ. She describes the pain as sharp and crampy. Her pain ranges from a 5-8/10 in severity. Helped with midol.   She did take two pregnancy at home that were both negative.   Sexually active, same partner x 5-6 months. They don't use condoms.   GYNECOLOGIC HISTORY: No LMP recorded. Contraception: OCP   Menopausal hormone therapy: n/a        OB History     Gravida  0   Para  0   Term  0   Preterm  0   AB  0   Living  0      SAB  0   IAB  0   Ectopic  0   Multiple  0   Live Births  0              Patient Active Problem List   Diagnosis Date Noted   HSV-2 infection 01/09/2021   PCOS (polycystic ovarian syndrome)    Dysmetabolic syndrome 95/12/3265   Increased BMI 07/17/2017   Melanocytic nevus 07/17/2017   Hypovitaminosis D 09/04/2015   Hypertriglyceridemia 04/10/2015   Acanthosis 04/10/2015   Insulin resistance 04/10/2015   Menorrhagia 04/10/2015   CHICKENPOX, HX OF 08/03/2006    Past Medical History:  Diagnosis Date   Abnormal uterine bleeding    Acid reflux    Asthma    High blood pressure    High cholesterol    Ingrown left big toenail 09/2016   recurrent   PCOS (polycystic ovarian syndrome)    Seasonal allergies     Past Surgical History:  Procedure Laterality Date   GALLBLADDER SURGERY  2021   LESION EXCISION Left 07/11/2014   Procedure: EXCISION OF BENIGN LESIONS INCLUDING MARGINS ;  Surgeon: Gerald Stabs, MD;  Location: [place];  Service: Pediatrics;  Laterality: Left;  mid back  and right neck   TOENAIL EXCISION  01/27/2012   Procedure: MINOR TOENAIL EXCISION;  Surgeon: Jerilynn Mages. Gerald Stabs, MD;  Location: [place];  Service: Pediatrics;  Laterality: Left;   TOENAIL EXCISION  03/23/2012   Procedure: MINOR TOENAIL EXCISION;  Surgeon: Jerilynn Mages. Gerald Stabs, MD;  Location: [place];  Service: Pediatrics;  Laterality: Right;   TOENAIL EXCISION Right 09/21/2012   Procedure: Partial Excision of Right Toenail;  Surgeon: Jerilynn Mages. Gerald Stabs, MD;  Location: [place];  Service: Pediatrics;  Laterality: Right;   TOENAIL EXCISION Left 02/15/2013   Procedure: PARTIAL TOENAIL EXCISION ON THE LEFT BIG TOE AT THE OUTER LATERAL FOLD WITH PHENOL (MP ROOM) ;  Surgeon: Jerilynn Mages. Gerald Stabs, MD;  Location: [place];  Service: Pediatrics;  Laterality: Left;   TOENAIL EXCISION Left 08/16/2013   Procedure: PARTIAL EXCISION OF LEFT BIG TOE NAIL FROM OUTER LATERAL FOLD;  Surgeon: Jerilynn Mages. Gerald Stabs, MD;  Location: [place];  Service: Pediatrics;  Laterality: Left;   TOENAIL EXCISION Left 09/16/2016   Procedure: PARTIAL  PERMANENT EXCISION OF LEFT INGROWN BIG TOENAIL;  Surgeon: Gerald Stabs, MD;  Location: [place];  Service: General;  Laterality: Left;   WISDOM TOOTH EXTRACTION  12/08/2018   all 4    Current Outpatient Medications  Medication Sig Dispense Refill   Acetaminophen-Caff-Pyrilamine (MIDOL MAX ST MENSTRUAL PO) Take 2 tablets by mouth 2 (two) times daily as needed (cramps).     albuterol (VENTOLIN HFA) 108 (90 Base) MCG/ACT inhaler Inhale 2 puffs into the lungs every 6 (six) hours as needed for wheezing or shortness of breath. 8 g 0   amoxicillin (AMOXIL) 500 MG tablet Take 1 tablet (500 mg total) by mouth 2 (two) times daily for 7 days. 14 tablet 0   Ascorbic Acid (VITAMIN C PO) Take 1,000 mg by mouth at bedtime.      aspirin-acetaminophen-caffeine (EXCEDRIN EXTRA STRENGTH) 250-250-65 MG tablet Take 2 tablets by mouth every 8 (eight) hours as needed for headache or migraine.     BALZIVA 0.4-35 MG-MCG tablet Take 1 tablet by mouth daily. 84 tablet 4   benzonatate (TESSALON) 100 MG capsule Take 1 capsule (100 mg total) by mouth 2 (two) times daily as needed for cough. 20 capsule 0   cetirizine (ZYRTEC) 10 MG tablet Take 10 mg by mouth daily as needed for allergies.     cyclobenzaprine (FLEXERIL) 5 MG tablet Take 1 tablet (5 mg total) by mouth at bedtime as needed for muscle spasms. 30 tablet 0   ibuprofen (ADVIL,MOTRIN) 200 MG tablet Take 400 mg by mouth every 6 (six) hours as needed for mild pain or headache.     Multiple Vitamins-Minerals (ZINC PO) Take 1 tablet by mouth at bedtime.     nystatin-triamcinolone ointment (MYCOLOG) Apply 1 application topically 2 (two) times daily. 30 g 0   omeprazole (PRILOSEC) 40 MG capsule Take 1 capsule by mouth twice daily 60 capsule 3   oseltamivir (TAMIFLU) 75 MG capsule Take 1 capsule (75 mg total) by mouth 2 (two) times daily. 10 capsule 0   valACYclovir (VALTREX) 500 MG tablet Take one tablet po BID x 3 days with an outbreak 30 tablet 2   VITAMIN D PO Take 1,000 Units by mouth at bedtime.     Vitamin D, Ergocalciferol, (DRISDOL) 1.25 MG (50000 UNIT) CAPS capsule Take 1 capsule (50,000 Units total) by mouth every 7 (seven) days. 12 capsule 0   No current facility-administered medications for this visit.     ALLERGIES: Clindamycin/lincomycin  Family History  Problem Relation Age of Onset   Asthma Mother    Diabetes Father    Hypertension Father    Asthma Father    Cirrhosis Paternal Grandmother        caused by medication   Diabetes Paternal Grandmother    Cervical cancer Maternal Grandmother     Social History   Socioeconomic History   Marital status: Single    Spouse name: Not on file   Number of children: Not on file   Years of education: Not on file    Highest education level: Not on file  Occupational History   Not on file  Tobacco Use   Smoking status: Never   Smokeless tobacco: Never  Vaping Use   Vaping Use: Never used  Substance and Sexual Activity   Alcohol use: No   Drug use: No   Sexual activity: Never    Birth control/protection: Abstinence, OCP  Other Topics Concern   Not on file  Social History Narrative  Is in 9th grade homeschooled   Social Determinants of Health   Financial Resource Strain: Not on file  Food Insecurity: No Food Insecurity   Worried About Charity fundraiser in the Last Year: Never true   Ran Out of Food in the Last Year: Never true  Transportation Needs: Not on file  Physical Activity: Not on file  Stress: Not on file  Social Connections: Not on file  Intimate Partner Violence: Not on file    Review of Systems  All other systems reviewed and are negative.  PHYSICAL EXAMINATION:    There were no vitals taken for this visit.    General appearance: alert, cooperative and appears stated age Abdomen: soft, tender in the RLQ, no rebound, no guarding, non distended, no masses,  no organomegaly  Pelvic: External genitalia:  no lesions              Urethra:  normal appearing urethra with no masses, tenderness or lesions              Bartholins and Skenes: normal                 Vagina: normal appearing vagina with normal color and discharge, no lesions              Cervix: no cervical motion tenderness and no lesions              Bimanual Exam:  Uterus:   no masses or tenderness              Adnexa:  no masses, mildly tender bilaterally              Rectovaginal: Yes.  .  Confirms.              Anus:  normal sphincter tone, no lesions  1. Breakthrough bleeding on OCPs - Pregnancy, urine: negative -Take pills at the same time daily (condom use also encouraged)  2. Combined abdominal and pelvic pain Tender up to the level of her umbilicus on the right. She has some mild adnexal tenderness,  no CMT or uterine tenderness. C/O emesis, no fevers. Recommended she go to the ER for evaluation, she declines. She states she will go to the ER if her pain worsens. - CBC with Differential/Platelet - SURESWAB CT/NG/T. vaginalis - US PELVIS TRANSVAGINAL NON-OB (TV ONLY); Future  3. Screening examination for STD (sexually transmitted disease) - RPR - HIV Antibody (routine testing w rflx) - SURESWAB CT/NG/T. vaginalis

## 2021-03-16 NOTE — ED Triage Notes (Signed)
Pt c/o RLQ abdominal pain worsening since yesterday. Pt states she normally has regular periods, but went approximately two months without a period before starting menstrual cycle yesterday. Pt endorses N/V/D.

## 2021-03-17 ENCOUNTER — Other Ambulatory Visit: Payer: Self-pay | Admitting: Obstetrics and Gynecology

## 2021-03-17 LAB — CBC WITH DIFFERENTIAL/PLATELET
Absolute Monocytes: 788 cells/uL (ref 200–950)
Basophils Absolute: 108 cells/uL (ref 0–200)
Basophils Relative: 1 %
Eosinophils Absolute: 259 cells/uL (ref 15–500)
Eosinophils Relative: 2.4 %
HCT: 42.7 % (ref 35.0–45.0)
Hemoglobin: 13.7 g/dL (ref 11.7–15.5)
Lymphs Abs: 2020 cells/uL (ref 850–3900)
MCH: 23.8 pg — ABNORMAL LOW (ref 27.0–33.0)
MCHC: 32.1 g/dL (ref 32.0–36.0)
MCV: 74.3 fL — ABNORMAL LOW (ref 80.0–100.0)
MPV: 10.4 fL (ref 7.5–12.5)
Monocytes Relative: 7.3 %
Neutro Abs: 7625 cells/uL (ref 1500–7800)
Neutrophils Relative %: 70.6 %
Platelets: 507 10*3/uL — ABNORMAL HIGH (ref 140–400)
RBC: 5.75 10*6/uL — ABNORMAL HIGH (ref 3.80–5.10)
RDW: 14.5 % (ref 11.0–15.0)
Total Lymphocyte: 18.7 %
WBC: 10.8 10*3/uL (ref 3.8–10.8)

## 2021-03-17 LAB — RPR: RPR Ser Ql: NONREACTIVE

## 2021-03-17 LAB — HIV ANTIBODY (ROUTINE TESTING W REFLEX): HIV 1&2 Ab, 4th Generation: NONREACTIVE

## 2021-03-17 NOTE — Progress Notes (Signed)
Future ultrasound orders cancelled, u/s was done in the ER

## 2021-03-18 LAB — SURESWAB CT/NG/T. VAGINALIS
C. trachomatis RNA, TMA: NOT DETECTED
N. gonorrhoeae RNA, TMA: NOT DETECTED
Trichomonas vaginalis RNA: NOT DETECTED

## 2021-03-19 NOTE — Progress Notes (Signed)
GYNECOLOGY  VISIT   HPI: 20 y.o.   Single White or Caucasian Not Hispanic or Latino  female   G0P0000 with Patient's last menstrual period was 03/15/2021.   here for f/u from ER visit on 03/16/21.  She was seen here on 03/16/21 c/o severe abdominal pain and emesis and ended up going to the ER. She continues to have some pain, but states that it is better. Normal BM qd, normal urinary function.  She has been having some BTB on her OCP's, bleeding seems to be stopping. She is taking her pills at the same time daily.   Labs and imaging reviewed.  WBC was 12, negative pregnancy test, negative STD testing. Negative CT, negative ultrasound.   Reviewed ER providers notes.   GYNECOLOGIC HISTORY: Patient's last menstrual period was 03/15/2021. Contraception: BCPs Menopausal hormone therapy: n/a        OB History     Gravida  0   Para  0   Term  0   Preterm  0   AB  0   Living  0      SAB  0   IAB  0   Ectopic  0   Multiple  0   Live Births  0              Patient Active Problem List   Diagnosis Date Noted   HSV-2 infection 01/09/2021   PCOS (polycystic ovarian syndrome)    Dysmetabolic syndrome 41/28/7867   Increased BMI 07/17/2017   Melanocytic nevus 07/17/2017   Hypovitaminosis D 09/04/2015   Hypertriglyceridemia 04/10/2015   Acanthosis 04/10/2015   Insulin resistance 04/10/2015   Menorrhagia 04/10/2015   CHICKENPOX, HX OF 08/03/2006    Past Medical History:  Diagnosis Date   Abnormal uterine bleeding    Acid reflux    Asthma    High blood pressure    High cholesterol    Ingrown left big toenail 09/2016   recurrent   PCOS (polycystic ovarian syndrome)    Seasonal allergies     Past Surgical History:  Procedure Laterality Date   GALLBLADDER SURGERY  2021   LESION EXCISION Left 07/11/2014   Procedure: EXCISION OF BENIGN LESIONS INCLUDING MARGINS ;  Surgeon: Gerald Stabs, MD;  Location: [place];  Service: Pediatrics;   Laterality: Left;  mid back  and right neck   TOENAIL EXCISION  01/27/2012   Procedure: MINOR TOENAIL EXCISION;  Surgeon: Jerilynn Mages. Gerald Stabs, MD;  Location: [place];  Service: Pediatrics;  Laterality: Left;   TOENAIL EXCISION  03/23/2012   Procedure: MINOR TOENAIL EXCISION;  Surgeon: Jerilynn Mages. Gerald Stabs, MD;  Location: [place];  Service: Pediatrics;  Laterality: Right;   TOENAIL EXCISION Right 09/21/2012   Procedure: Partial Excision of Right Toenail;  Surgeon: Jerilynn Mages. Gerald Stabs, MD;  Location: [place];  Service: Pediatrics;  Laterality: Right;   TOENAIL EXCISION Left 02/15/2013   Procedure: PARTIAL TOENAIL EXCISION ON THE LEFT BIG TOE AT THE OUTER LATERAL FOLD WITH PHENOL (MP ROOM) ;  Surgeon: Jerilynn Mages. Gerald Stabs, MD;  Location: [place];  Service: Pediatrics;  Laterality: Left;   TOENAIL EXCISION Left 08/16/2013   Procedure: PARTIAL EXCISION OF LEFT BIG TOE NAIL FROM OUTER LATERAL FOLD;  Surgeon: Jerilynn Mages. Gerald Stabs, MD;  Location: [place];  Service: Pediatrics;  Laterality: Left;   TOENAIL EXCISION Left 09/16/2016   Procedure: PARTIAL PERMANENT EXCISION OF LEFT INGROWN BIG TOENAIL;  Surgeon:  Gerald Stabs, MD;  Location: [place];  Service: General;  Laterality: Left;   WISDOM TOOTH EXTRACTION  12/08/2018   all 4    Current Outpatient Medications  Medication Sig Dispense Refill   Acetaminophen-Caff-Pyrilamine (MIDOL MAX ST MENSTRUAL PO) Take 2 tablets by mouth 2 (two) times daily as needed (cramps).     albuterol (VENTOLIN HFA) 108 (90 Base) MCG/ACT inhaler Inhale 2 puffs into the lungs every 6 (six) hours as needed for wheezing or shortness of breath. 8 g 0   Ascorbic Acid (VITAMIN C PO) Take 1,000 mg by mouth daily.     aspirin-acetaminophen-caffeine (EXCEDRIN EXTRA STRENGTH) 250-250-65 MG tablet Take 2 tablets by mouth every 8 (eight) hours as needed for headache or migraine.      BALZIVA 0.4-35 MG-MCG tablet Take 1 tablet by mouth daily. 84 tablet 4   cetirizine (ZYRTEC) 10 MG tablet Take 10 mg by mouth daily as needed for allergies.     cholecalciferol (VITAMIN D3) 25 MCG (1000 UNIT) tablet Take 1,000 Units by mouth daily.     ibuprofen (ADVIL,MOTRIN) 200 MG tablet Take 400 mg by mouth every 6 (six) hours as needed for mild pain or headache.     nystatin-triamcinolone ointment (MYCOLOG) Apply 1 application topically 2 (two) times daily. 30 g 0   omeprazole (PRILOSEC) 40 MG capsule Take 1 capsule by mouth twice daily (Patient taking differently: 40 mg daily.) 60 capsule 3   valACYclovir (VALTREX) 500 MG tablet Take one tablet po BID x 3 days with an outbreak 30 tablet 2   Vitamin D, Ergocalciferol, (DRISDOL) 1.25 MG (50000 UNIT) CAPS capsule Take 1 capsule (50,000 Units total) by mouth every 7 (seven) days. 12 capsule 0   zinc gluconate 50 MG tablet Take 50 mg by mouth daily.     No current facility-administered medications for this visit.     ALLERGIES: Clindamycin/lincomycin  Family History  Problem Relation Age of Onset   Asthma Mother    Diabetes Father    Hypertension Father    Asthma Father    Cirrhosis Paternal Grandmother        caused by medication   Diabetes Paternal Grandmother    Cervical cancer Maternal Grandmother     Social History   Socioeconomic History   Marital status: Single    Spouse name: Not on file   Number of children: Not on file   Years of education: Not on file   Highest education level: Not on file  Occupational History   Not on file  Tobacco Use   Smoking status: Never   Smokeless tobacco: Never  Vaping Use   Vaping Use: Never used  Substance and Sexual Activity   Alcohol use: No   Drug use: No   Sexual activity: Never    Birth control/protection: Abstinence, OCP  Other Topics Concern   Not on file  Social History Narrative   Is in 9th grade homeschooled   Social Determinants of Health   Financial Resource  Strain: Not on file  Food Insecurity: No Food Insecurity   Worried About Charity fundraiser in the Last Year: Never true   Ran Out of Food in the Last Year: Never true  Transportation Needs: Not on file  Physical Activity: Not on file  Stress: Not on file  Social Connections: Not on file  Intimate Partner Violence: Not on file    ROS  PHYSICAL EXAMINATION:    BP 104/82    Pulse 82  LMP 03/15/2021    SpO2 98%     General appearance: alert, cooperative and appears stated age Abdomen: soft, non-tender; non distended, no masses,  no organomegaly  Pelvic ultrasound images from the ER were reviewed with the patient.  1. Combined abdominal and pelvic pain Negative pregnancy test, negative STD testing, negative CT, negative pelvic ultrasound. Benign exam. I don't think her pain was from a GYN source, pain improved but not resolved. Recommended that she f/u with her primary if her pain doesn't resolve.    2. Breakthrough bleeding on OCPs Continue to take her pills at the same time daily

## 2021-03-20 ENCOUNTER — Encounter: Payer: Self-pay | Admitting: Obstetrics and Gynecology

## 2021-03-20 ENCOUNTER — Other Ambulatory Visit: Payer: Self-pay

## 2021-03-20 ENCOUNTER — Ambulatory Visit: Payer: Managed Care, Other (non HMO) | Admitting: Obstetrics and Gynecology

## 2021-03-20 VITALS — BP 104/82 | HR 82

## 2021-03-20 DIAGNOSIS — N921 Excessive and frequent menstruation with irregular cycle: Secondary | ICD-10-CM | POA: Diagnosis not present

## 2021-03-20 DIAGNOSIS — R102 Pelvic and perineal pain: Secondary | ICD-10-CM

## 2021-03-20 DIAGNOSIS — R109 Unspecified abdominal pain: Secondary | ICD-10-CM

## 2021-03-25 ENCOUNTER — Ambulatory Visit: Payer: Managed Care, Other (non HMO) | Admitting: Family Medicine

## 2021-03-25 ENCOUNTER — Encounter: Payer: Self-pay | Admitting: Family Medicine

## 2021-03-25 ENCOUNTER — Ambulatory Visit (INDEPENDENT_AMBULATORY_CARE_PROVIDER_SITE_OTHER): Payer: Managed Care, Other (non HMO) | Admitting: Family Medicine

## 2021-03-25 VITALS — BP 128/84 | HR 100 | Temp 97.9°F | Wt 301.6 lb

## 2021-03-25 DIAGNOSIS — J069 Acute upper respiratory infection, unspecified: Secondary | ICD-10-CM

## 2021-03-25 DIAGNOSIS — J020 Streptococcal pharyngitis: Secondary | ICD-10-CM | POA: Diagnosis not present

## 2021-03-25 DIAGNOSIS — R109 Unspecified abdominal pain: Secondary | ICD-10-CM | POA: Diagnosis not present

## 2021-03-25 MED ORDER — AMOXICILLIN-POT CLAVULANATE 500-125 MG PO TABS: 1.0000 | ORAL_TABLET | Freq: Two times a day (BID) | ORAL | 0 refills | Status: AC

## 2021-03-25 NOTE — Progress Notes (Signed)
Subjective:    Patient ID: Terri Cook, female    DOB: 2000-07-30, 20 y.o.   MRN: [id number]  Chief Complaint  Patient presents with   Follow-up    Pain in right side, thinks it is PCOS from the ED.     HPI Patient was seen today for ongoing concern.  Pt seen in ED 12/12 and by OB/Gyn for R lower abd pain.  Pt states when pain initially started it was sharp, stabbing, accompanied with nausea.  Pt also notes absent menses x 2 months. In ED, ALT 49, WBC 12.2, Plts 489.  UA cloudy with hemoglobin, bilirubin, protein, bacteria.  CT abd without findings to explain RLQ pain.  An appendicolith noted in the distal appendix, however normal in caliber w/o evidence of inflammation.     F/u with OB/Gyn also unrevealing.  Pt advised symptoms likely 2/2 PCOS.  Taking OCPs.  Pt mentions 2 epsidoes of "blacking out" when had abd pain.  States was in the shower and the other time while walking down the deck stairs.  Pt was able to catch herself.  Did not eat or drink much on those days 2/2 the nausea and abd pain.  Not currently having abd pain.  Pt states her throat started hurting yesterday with a burning sensation noted.  Now with rhinorrhea and occasional cough.  Pt unsure of sick contacts as works at [employer].  Has not taken anything for symptoms.  Past Medical History:  Diagnosis Date   Abnormal uterine bleeding    Acid reflux    Asthma    High blood pressure    High cholesterol    Ingrown left big toenail 09/2016   recurrent   PCOS (polycystic ovarian syndrome)    Seasonal allergies     Allergies  Allergen Reactions   Clindamycin/Lincomycin Anaphylaxis    Trouble breathing?    ROS General: Denies fever, chills, night sweats, changes in weight, changes in appetite HEENT: Denies headaches, ear pain, changes in vision  + rhinorrhea, sore throat CV: Denies CP, palpitations, SOB, orthopnea Pulm: Denies SOB, cough, wheezing GI: Denies vomiting, diarrhea, constipation  +R lower abd pain and  nausea-resolved. GU: Denies dysuria, hematuria, frequency, vaginal discharge Msk: Denies muscle cramps, joint pains Neuro: Denies weakness, numbness, tingling Skin: Denies rashes, bruising Psych: Denies depression, anxiety, hallucinations     Objective:    Blood pressure 128/84, pulse 100, temperature 97.9 F (36.6 C), temperature source Oral, weight (!) 301 lb 9.6 oz (136.8 kg), last menstrual period 03/15/2021, SpO2 99 %.  Gen. Pleasant, well-nourished, in no distress, normal affect   HEENT: [place]/AT, face symmetric, conjunctiva clear, no scleral icterus, PERRLA, EOMI, nares patent without drainage, pharynx with erythema, no exudate. Lungs: no accessory muscle use, CTAB, no wheezes or rales Cardiovascular: RRR, no m/r/g, no peripheral edema Musculoskeletal: No deformities, no cyanosis or clubbing, normal tone Neuro:  A&Ox3, CN II-XII intact, normal gait Skin:  Warm, no lesions/ rash   Wt Readings from Last 3 Encounters:  03/25/21 (!) 301 lb 9.6 oz (136.8 kg)  03/16/21 300 lb (136.1 kg)  03/16/21 300 lb (136.1 kg)    Lab Results  Component Value Date   WBC 12.2 (H) 03/16/2021   HGB 13.4 03/16/2021   HCT 41.9 03/16/2021   PLT 489 (H) 03/16/2021   GLUCOSE 107 (H) 03/16/2021   CHOL 205 (H) 01/09/2021   TRIG 127.0 01/09/2021   HDL 47.90 01/09/2021   LDLCALC 132 (H) 01/09/2021   ALT 49 (H) 03/16/2021  AST 28 03/16/2021   NA 138 03/16/2021   K 3.6 03/16/2021   CL 105 03/16/2021   CREATININE 0.71 03/16/2021   BUN 8 03/16/2021   CO2 23 03/16/2021   TSH 2.99 01/09/2021   HGBA1C 5.8 01/09/2021    Assessment/Plan:  Right sided abdominal pain  -currently resolved.  Etiology unclear.  Consider mittelschmerz, ovarian cyst, constipation. -pelvic u/s negative on 03/16/21 -CT abd pelvis w/ contrast 03/16/21 with no CT findings of abdomen or pelvis to explain right lower quadrant pain.  Appendicolith in distal appndix, however normal in caliber w/o evidence of inflammation.   Trace fluid in low pelvis, nonspecific and likely functional in the reproductive age setting.  S/p cholecystectomy. -Supportive care including heat, Tylenol, or ibuprofen -Continue OCPs -Continue follow-up with OB/GYN - Plan: POC Rapid Strep A  Viral URI -Supportive care -POC rapid strep positive - Plan: POC Rapid Strep A, POC COVID-19  Strep pharyngitis  -Supportive care -Consider ENT follow-up for chronic strep. -start abx - Plan: amoxicillin-clavulanate (AUGMENTIN) 500-125 MG tablet  F/u as needed  Grier Mitts, MD

## 2021-03-26 ENCOUNTER — Ambulatory Visit (INDEPENDENT_AMBULATORY_CARE_PROVIDER_SITE_OTHER): Payer: Managed Care, Other (non HMO) | Admitting: Podiatry

## 2021-03-26 ENCOUNTER — Other Ambulatory Visit: Payer: Self-pay

## 2021-03-26 DIAGNOSIS — L600 Ingrowing nail: Secondary | ICD-10-CM | POA: Diagnosis not present

## 2021-03-26 NOTE — Patient Instructions (Signed)

## 2021-03-31 NOTE — Progress Notes (Signed)
Subjective: 20 year old female presents the office today for follow-up evaluation after undergoing left hallux partial nail avulsion.  She states that he is feeling well.  She denies any drainage.  She states that she does not have any pain unless she were to hit the toe.  She was soaking in Epson salts.  She currently was on amoxicillin and she is currently taking again for another unrelated issue.  Currently denies any fevers or chills.  She has no other concerns today.  Objective: AAO x3, NAD DP/PT pulses palpable bilaterally, CRT less than 3 seconds Status post partial nail avulsion left hallux toenail.  There is no erythema, drainage or pus or ascending cellulitis but the area started to scab over.  There is no pain.  No fluctuation or any signs of abscess.  No pain with calf compression, swelling, warmth, erythema  Assessment: Status post partial nail avulsion  Plan: -All treatment options discussed with the patient including all alternatives, risks, complications.  -Reviewed the wound culture with her.  Antibiotics should be adequate to help with this as well for her other issue.  Clinically no signs of infection.  Continue to wash with soap and water daily and apply a small amount of antibiotic ointment and a bandage during the day but can leave the area open at nighttime.  Monitor for any signs or symptoms of infection or reoccurrence. -Patient encouraged to call the office with any questions, concerns, change in symptoms.   Trula Slade DPM

## 2021-04-07 ENCOUNTER — Telehealth: Payer: Self-pay

## 2021-04-07 NOTE — Telephone Encounter (Signed)
--  Caller states that she was recently given Amoxicillin. Caller states that she finished abx a couple days ago. Caller has vaginal itching at this time. Caller has vaginal burning. Caller states no other sx.  04/04/2021 10:29:46 AM See PCP within 24 Hours Laurann Montana, RN, [facility]  Referrals [place] UNDECIDED  04/07/21 1312: LVM notifying pt that she can call office if needed.

## 2021-04-08 LAB — POC COVID19 BINAXNOW: SARS Coronavirus 2 Ag: NEGATIVE

## 2021-04-08 LAB — POCT RAPID STREP A (OFFICE): Rapid Strep A Screen: POSITIVE — AB

## 2021-04-22 ENCOUNTER — Encounter: Payer: Managed Care, Other (non HMO) | Attending: Family Medicine | Admitting: Registered"

## 2021-04-22 ENCOUNTER — Other Ambulatory Visit: Payer: Self-pay

## 2021-04-22 DIAGNOSIS — E669 Obesity, unspecified: Secondary | ICD-10-CM | POA: Diagnosis present

## 2021-04-22 NOTE — Patient Instructions (Addendum)
Instructions/Goals:  Make sure to get in three meals per day. Try to have balanced meals like the My Plate example (see handout). Include lean proteins, vegetables, fruits, and whole grains at meals.    Work to have ratios/portions like the balanced plate example. Continue. Good job watching your portions.   Add vegetables to lunch and dinner and fruit with breakfast.   Water Goals: Put out 1 bottle cold plain water and sip in between the flavored water during the day.   Cereals or protein bars: try for at least 8 g protein and 4 g fiber. Try Special K Protein Cereal   Try to include balanced snacks (see handout) to help reduce cravings and manage blood sugar.   Make physical activity a part of your week. Try to include at least 30 minutes of physical activity 5 days each week or at least 150 minutes per week. Regular physical activity promotes overall health-including helping to reduce risk for heart disease and diabetes, promoting mental health, and helping [place] sleep better.    Include at least 30 minutes activity x 5 days per week. Continue working to start back with your bike.   Recommend having iron checked as soon as possible due to very heavy period to see if supplement is needed.

## 2021-04-22 NOTE — Progress Notes (Signed)
Medical Nutrition Therapy:  Appt start time: 0915 end time:  0945.  Assessment:  Primary concerns today: Pt referred due to wt management. Pt dx with PCOS.   Nutrition Follow Up: Pt present for appointment alone.  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 Unsure what hours will be. 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 Sometimes working 2nd shift at [employer]. Reports challenging due to changes in sleep schedule when working. Got off at 11 PM last night. On regular days gets off at 10 PM.   Pt reports she hasn't lost as much weight as she would like. Reports she is trying not to focus on the weight so much. Reports during the day having more energy since making changes with her eating and able to wear a shirt she couldn't wear before. Reports scale hasn't changed much but trying not to stress. Reports doing portions like the plate example discussed last time. Reports not really needing to snack during the day since doing the balanced plate. Reports struggling with vegetables. Reports she is going to buy some when she gets paid. Pt lives with family and they have groceries but she tries to purchase extra foods she wants that they don't usually purchase.   Pt reports walking a lot at her work and tries to walk on off days as well-tries do cleaning and move around the home. Not doing stationary bike right now but planning to get back in routine with it.   Pt reports having very heavy period last time which was earlier this month. Reports having to change her pad 20 times in one day. Reports she has talked with her doctor about it. Wants to know if doing Gatorade or Body Armor could help with reducing tiredness when having her period. Pt reports she had her labs checked last month but not since her last period. Reports taking vitamin D, C, and zinc combination. Pt reports she doesn't want to take a multivitamin with iron because she is afraid it will  cause GI issues because her sister takes an iron supplement and it causes GI issues for her. Pt reports she will call her doctor's office today to ask about having iron checked.   Pt reports she is doing well with flavored water but getting in plain water is still difficult.   Food Allergies/Intolerances: None reported.   GI Concerns: GER.   Social/Other: Lives with parents and 3 siblings. Pt cooks sometimes, trying to learn more about cooking. Eats at home more often than out.   Sleep Routine: Wakes around 830 AM and goes to bed around 12 midnight.   Pertinent Lab Values:  08/14/20:  HgbA1c: 5.7  01/17/20:  Triglycerides: 275 VLDL Cholesterol: 51 LDL Cholesterol: 158 Chol/HDL Ratio: 5.5  Weight Hx: See chart.   Preferred Learning Style:  No preference indicated   Learning Readiness:  Ready  MEDICATIONS: Reviewed. See list. Supplements: vitamin D,C and zinc combination.    DIETARY INTAKE:  Usual eating pattern includes 3 meals and no snacks daily.   Common foods: N/A.  Avoided foods: peanut butter and jelly sandwiches, brussels sprouts.    Typical Snacks: None reported.     Typical Beverages: flavored water x 4-5 bottles, occasionally sweet tea or soda.  Location of Meals: Sometimes with family. Eats in livingroom sometimes.   Electronics Present at [employer]: Yes: phone   24-hr recall: Woke around 10 AM B (AM):  Snk ( AM):  L ( PM):  Snk ( PM):  D (430 PM): ham and cheese on wheat bread  Snk (PM):  Beverages: flavored water?  Usual physical activity: Reports walking a lot at new job ~2 days per week. Reports trying to walk more around home (inside) and wants to get back into using stationary bike.   Progress Towards Goal(s):  Some progress.    Nutritional Diagnosis:  NB-1.1 Food and nutrition-related knowledge deficit As related to balanced nutrition.  As evidenced by pt's reported dietary intake and habits .    Intervention:  Nutrition counseling  provided. Dietitian praised pt for working on eating more consistently and portioning her plate. Pt doing well to recognize non-weight victories such as feeling better and clothes fitting different rather than only focusing being weight change. Discussed working to add vegetables with lunch and dinner and fruit with breakfast. Discussed sipping plain water during the day in between having flavored water and keeping the water cold as sometimes water can seem to have a taste at room temperature. Discussed working to start back with her stationary bike. Recommend pt have her iron checked as soon as possible due to report of high blood loss with last period to assess if iron supplement is needed. Pt appeared agreeable to information/goals discussed.   Instructions/Goals:  Make sure to get in three meals per day. Try to have balanced meals like the My Plate example (see handout). Include lean proteins, vegetables, fruits, and whole grains at meals.    Work to have ratios/portions like the balanced plate example. Continue. Good job watching your portions.   Add vegetables to lunch and dinner and fruit with breakfast.   Water Goals: Put out 1 bottle cold plain water and sip in between the flavored water during the day.   Cereals or protein bars: try for at least 8 g protein and 4 g fiber. Try Special K Protein Cereal   Try to include balanced snacks (see handout) to help reduce cravings and manage blood sugar.   Make physical activity a part of your week. Try to include at least 30 minutes of physical activity 5 days each week or at least 150 minutes per week. Regular physical activity promotes overall health-including helping to reduce risk for heart disease and diabetes, promoting mental health, and helping [place] sleep better.    Include at least 30 minutes activity x 5 days per week. Continue working to start back with your bike.   Recommend having iron checked as soon as possible due to very heavy  period to see if supplement is needed.   Teaching Method Utilized: Visual Auditory  Barriers to learning/adherence to lifestyle change: None reported.   Demonstrated degree of understanding via:  Teach Back   Monitoring/Evaluation:  Dietary intake, exercise, and body weight in 2 month(s).

## 2021-04-23 ENCOUNTER — Telehealth: Payer: Self-pay | Admitting: Family Medicine

## 2021-04-23 NOTE — Telephone Encounter (Signed)
Would encourage pt to f/u with OB/Gyn for irregular menses and PCOS.

## 2021-04-23 NOTE — Telephone Encounter (Signed)
Patient called because she had a appointment with her health and diabetes doctor yesterday and she wanted to have patients iron levels checked. Patient states they discussed her menstrual situation and doctor recommended the lab work. Patient will need orders for lab work before she can be scheduled       Please advise

## 2021-04-28 ENCOUNTER — Ambulatory Visit: Payer: Managed Care, Other (non HMO) | Admitting: Registered"

## 2021-04-29 ENCOUNTER — Encounter: Payer: Self-pay | Admitting: Registered"

## 2021-05-04 ENCOUNTER — Telehealth: Payer: Self-pay

## 2021-05-04 NOTE — Telephone Encounter (Signed)
Patient said her health and diabetes counselor recommended she have lab work performed to check her iron.  I see the nutritionist's office note 04/22/21 "Recommend pt have her iron checked as soon as possible due to report of high blood loss with last period to assess if iron supplement is needed."

## 2021-05-04 NOTE — Telephone Encounter (Signed)
Tried contacting patient, mailbox is full.

## 2021-05-05 NOTE — Telephone Encounter (Signed)
Left message to call.

## 2021-05-05 NOTE — Telephone Encounter (Signed)
Please ask the patient how heavily she has been bleeding (how fast is she saturating a pad or tampon and what size). She reported some BTB on OCP's at her last visit, but light bleeding.  Please have her come in for a CBC, ferritin.  If she is saturating a pad or tampon an hour she should be seen.

## 2021-05-11 ENCOUNTER — Other Ambulatory Visit: Payer: Self-pay

## 2021-05-11 DIAGNOSIS — N920 Excessive and frequent menstruation with regular cycle: Secondary | ICD-10-CM

## 2021-05-11 NOTE — Telephone Encounter (Signed)
If she is saturating through an overnight pain in less than one hour, she should go to the ER. That is excessive.

## 2021-05-11 NOTE — Telephone Encounter (Signed)
Left message to call. I did tell patient this earlier in our conversation.

## 2021-05-11 NOTE — Telephone Encounter (Signed)
Per DPR access note on file I left detailed message in voice mail advising her "If she is saturating through an overnight pain in less than one hour, she should go to the ER. That is excessive."

## 2021-05-11 NOTE — Telephone Encounter (Addendum)
Patient returned my call today.  I asked her about her bleeding.  She said she is on her period now since Thursday last week. Since Saturday changing an overnight pad every 30 mins to one hour.  The cramps are so bad that they make her vomit sometimes.  This last overnight pad saturated in 15 minutes.  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  She said she could come in tomorrow before 11:45am when she has to be at work again. Appt desk scheduled her for 10:15am in the morning.   I spoke with patient again about the appt in the morning. She was concerned she will not be out in time for work. I encouraged her to make her health a priority.  I asked her about going to ER tonight when she gets off work but she said all the ERs she called has 9-12 hour wait and she cannot do that.

## 2021-05-12 ENCOUNTER — Encounter: Payer: Self-pay | Admitting: Obstetrics and Gynecology

## 2021-05-12 ENCOUNTER — Ambulatory Visit: Payer: Managed Care, Other (non HMO) | Admitting: Obstetrics and Gynecology

## 2021-05-12 ENCOUNTER — Other Ambulatory Visit: Payer: Self-pay

## 2021-05-12 VITALS — BP 110/80 | HR 81 | Resp 16 | Wt 312.0 lb

## 2021-05-12 DIAGNOSIS — N920 Excessive and frequent menstruation with regular cycle: Secondary | ICD-10-CM | POA: Diagnosis not present

## 2021-05-12 DIAGNOSIS — N939 Abnormal uterine and vaginal bleeding, unspecified: Secondary | ICD-10-CM

## 2021-05-12 LAB — CBC WITH DIFFERENTIAL/PLATELET
Absolute Monocytes: 603 cells/uL (ref 200–950)
Basophils Absolute: 114 cells/uL (ref 0–200)
Basophils Relative: 1.1 %
Eosinophils Absolute: 239 cells/uL (ref 15–500)
Eosinophils Relative: 2.3 %
HCT: 40.7 % (ref 35.0–45.0)
Hemoglobin: 13.3 g/dL (ref 11.7–15.5)
Lymphs Abs: 1966 cells/uL (ref 850–3900)
MCH: 24.8 pg — ABNORMAL LOW (ref 27.0–33.0)
MCHC: 32.7 g/dL (ref 32.0–36.0)
MCV: 75.8 fL — ABNORMAL LOW (ref 80.0–100.0)
MPV: 9.9 fL (ref 7.5–12.5)
Monocytes Relative: 5.8 %
Neutro Abs: 7478 cells/uL (ref 1500–7800)
Neutrophils Relative %: 71.9 %
Platelets: 438 10*3/uL — ABNORMAL HIGH (ref 140–400)
RBC: 5.37 10*6/uL — ABNORMAL HIGH (ref 3.80–5.10)
RDW: 14.7 % (ref 11.0–15.0)
Total Lymphocyte: 18.9 %
WBC: 10.4 10*3/uL (ref 3.8–10.8)

## 2021-05-12 LAB — FERRITIN: Ferritin: 5 ng/mL — ABNORMAL LOW (ref 16–154)

## 2021-05-12 LAB — PREGNANCY, URINE: Preg Test, Ur: NEGATIVE

## 2021-05-12 NOTE — Progress Notes (Signed)
GYNECOLOGY  VISIT   HPI: 21 y.o.   Single White or Caucasian Not Hispanic or Latino  female   [id number] with Patient's last menstrual period was 05/07/2021.   here for  menorrhagia. She is on the placebo pills of her pill pack.  Her cycle started on 05/07/21. She was changing her overnight pads every time she went to the bathroom. Today just spotting. She voids up to every 15-30 minutes and changes her pad every time. It is not full, but has blood in the middle. Heavier than normal.  She is taking her pills at the same time daily.   I asked her about her frequent voiding, she said it has always been like that, it's normal for her. Declines evaluation.  She hasn't had a new sexual partner since her testing in 12/22  GYNECOLOGIC HISTORY: Patient's last menstrual period was 05/07/2021. Contraception:ocp Menopausal hormone therapy: none        OB History     Gravida  0   Para  0   Term  0   Preterm  0   AB  0   Living  0      SAB  0   IAB  0   Ectopic  0   Multiple  0   Live Births  0              Patient Active Problem List   Diagnosis Date Noted   HSV-2 infection 01/09/2021   PCOS (polycystic ovarian syndrome)    Dysmetabolic syndrome 13/11/6576   Increased BMI 07/17/2017   Melanocytic nevus 07/17/2017   Hypovitaminosis D 09/04/2015   Hypertriglyceridemia 04/10/2015   Acanthosis 04/10/2015   Insulin resistance 04/10/2015   Menorrhagia 04/10/2015   CHICKENPOX, HX OF 08/03/2006    Past Medical History:  Diagnosis Date   Abnormal uterine bleeding    Acid reflux    Asthma    High blood pressure    High cholesterol    Ingrown left big toenail 09/2016   recurrent   PCOS (polycystic ovarian syndrome)    Seasonal allergies     Past Surgical History:  Procedure Laterality Date   GALLBLADDER SURGERY  2021   LESION EXCISION Left 07/11/2014   Procedure: EXCISION OF BENIGN LESIONS INCLUDING MARGINS ;  Surgeon: Gerald Stabs, MD;  Location: [place];  Service: Pediatrics;  Laterality: Left;  mid back  and right neck   TOENAIL EXCISION  01/27/2012   Procedure: MINOR TOENAIL EXCISION;  Surgeon: Jerilynn Mages. Gerald Stabs, MD;  Location: [facility];  Service: Pediatrics;  Laterality: Left;   TOENAIL EXCISION  03/23/2012   Procedure: MINOR TOENAIL EXCISION;  Surgeon: Jerilynn Mages. Gerald Stabs, MD;  Location: [place];  Service: Pediatrics;  Laterality: Right;   TOENAIL EXCISION Right 09/21/2012   Procedure: Partial Excision of Right Toenail;  Surgeon: Jerilynn Mages. Gerald Stabs, MD;  Location: [place];  Service: Pediatrics;  Laterality: Right;   TOENAIL EXCISION Left 02/15/2013   Procedure: PARTIAL TOENAIL EXCISION ON THE LEFT BIG TOE AT THE OUTER LATERAL FOLD WITH PHENOL (MP ROOM) ;  Surgeon: Jerilynn Mages. Gerald Stabs, MD;  Location: [place];  Service: Pediatrics;  Laterality: Left;   TOENAIL EXCISION Left 08/16/2013   Procedure: PARTIAL EXCISION OF LEFT BIG TOE NAIL FROM OUTER LATERAL FOLD;  Surgeon: Jerilynn Mages. Gerald Stabs, MD;  Location: [place];  Service: Pediatrics;  Laterality: Left;   TOENAIL EXCISION Left 09/16/2016   Procedure:  PARTIAL PERMANENT EXCISION OF LEFT INGROWN BIG TOENAIL;  Surgeon: Gerald Stabs, MD;  Location: [place];  Service: General;  Laterality: Left;   WISDOM TOOTH EXTRACTION  12/08/2018   all 4    Current Outpatient Medications  Medication Sig Dispense Refill   Acetaminophen-Caff-Pyrilamine (MIDOL MAX ST MENSTRUAL PO) Take 2 tablets by mouth 2 (two) times daily as needed (cramps).     ADVAIR HFA 230-21 MCG/ACT inhaler SMARTSIG:2 Puff(s) By Mouth Twice Daily     Ascorbic Acid (VITAMIN C PO) Take 1,000 mg by mouth daily.     aspirin-acetaminophen-caffeine (EXCEDRIN EXTRA STRENGTH) 250-250-65 MG tablet Take 2 tablets by mouth every 8 (eight) hours as needed for headache or migraine.     BALZIVA 0.4-35 MG-MCG tablet Take 1 tablet by  mouth daily. 84 tablet 4   cetirizine (ZYRTEC) 10 MG tablet Take 10 mg by mouth daily as needed for allergies.     cholecalciferol (VITAMIN D3) 25 MCG (1000 UNIT) tablet Take 1,000 Units by mouth daily.     ibuprofen (ADVIL,MOTRIN) 200 MG tablet Take 400 mg by mouth every 6 (six) hours as needed for mild pain or headache.     omeprazole (PRILOSEC) 40 MG capsule Take 1 capsule by mouth twice daily (Patient taking differently: 40 mg daily.) 60 capsule 3   valACYclovir (VALTREX) 500 MG tablet Take one tablet po BID x 3 days with an outbreak 30 tablet 2   zinc gluconate 50 MG tablet Take 50 mg by mouth daily.     No current facility-administered medications for this visit.     ALLERGIES: Clindamycin/lincomycin  Family History  Problem Relation Age of Onset   Asthma Mother    Diabetes Father    Hypertension Father    Asthma Father    Cirrhosis Paternal Grandmother        caused by medication   Diabetes Paternal Grandmother    Cervical cancer Maternal Grandmother     Social History   Socioeconomic History   Marital status: Single    Spouse name: Not on file   Number of children: Not on file   Years of education: Not on file   Highest education level: Not on file  Occupational History   Not on file  Tobacco Use   Smoking status: Every Day    Types: Cigarettes   Smokeless tobacco: Never   Tobacco comments:    1-2 a day  Vaping Use   Vaping Use: Never used  Substance and Sexual Activity   Alcohol use: No   Drug use: No   Sexual activity: Not Currently    Birth control/protection: Abstinence, OCP  Other Topics Concern   Not on file  Social History Narrative   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   HENT: Negative.    Eyes: Negative.   Respiratory: Negative.    Cardiovascular: Negative.   Gastrointestinal: Negative.   Genitourinary:        Heavy bleeding  Musculoskeletal: Negative.   Skin:  Negative.   Neurological: Negative.   Endo/Heme/Allergies: Negative.   Psychiatric/Behavioral: Negative.     PHYSICAL EXAMINATION:    BP 110/80    Pulse 81    Resp 16    Wt (!) 312 lb (141.5 kg)    LMP 05/07/2021    BMI 47.44 kg/m     General appearance: alert, cooperative and appears stated age  Pelvic: External genitalia:  no lesions              Urethra:  normal appearing urethra with no masses, tenderness or lesions              Bartholins and Skenes: normal                 Vagina: normal appearing vagina with normal color and discharge, no lesions              Cervix: no cervical motion tenderness and no lesions              Bimanual Exam:  Uterus:   no masses or tenderness              Adnexa: no mass, fullness, tenderness                Chaperone was present for exam.  1. Episode of heavy vaginal bleeding On OCP's, difficult to quantify. She changes her pad every time she goes to the bathroom (not full). Just spotting now - Pregnancy, urine -Continue OCP's  2. Menorrhagia with regular cycle on OCPs Normal TSH in 10/22 - Ferritin - CBC w/Diff

## 2021-05-19 ENCOUNTER — Other Ambulatory Visit: Payer: Self-pay

## 2021-05-19 DIAGNOSIS — R790 Abnormal level of blood mineral: Secondary | ICD-10-CM

## 2021-06-10 ENCOUNTER — Ambulatory Visit: Payer: Managed Care, Other (non HMO) | Admitting: Registered"

## 2021-07-27 ENCOUNTER — Encounter: Payer: Self-pay | Admitting: Family Medicine

## 2021-07-27 ENCOUNTER — Ambulatory Visit (INDEPENDENT_AMBULATORY_CARE_PROVIDER_SITE_OTHER): Payer: Managed Care, Other (non HMO) | Admitting: Family Medicine

## 2021-07-27 VITALS — BP 132/89 | HR 100 | Temp 98.1°F | Wt 317.4 lb

## 2021-07-27 DIAGNOSIS — H6981 Other specified disorders of Eustachian tube, right ear: Secondary | ICD-10-CM

## 2021-07-27 DIAGNOSIS — H9192 Unspecified hearing loss, left ear: Secondary | ICD-10-CM

## 2021-07-27 DIAGNOSIS — J301 Allergic rhinitis due to pollen: Secondary | ICD-10-CM

## 2021-07-27 DIAGNOSIS — H6122 Impacted cerumen, left ear: Secondary | ICD-10-CM

## 2021-07-27 NOTE — Progress Notes (Signed)
Subjective:  ? ? Patient ID: Terri Cook, female    DOB: 2001-02-11, 21 y.o.   MRN: [id number] ? ?Chief Complaint  ?Patient presents with  ? Ear Pain  ?  Left ear pain started Saturday evening. Muffled and feel full of fluid.has cotton ball in it to prevent air from blowing in it.  Has used otc earache relief drops.  ? ? ?HPI ?Patient was seen today for acute concern.  Pt endorses L ear feeling muffled since Saturday.  R ear popping occasionally.  Denies ear pain, drainage, fever, chills, sore throat, cough.  Pt tried OTC earache relief gtts.  Pt taking OTC allergy relief med for typical seasonal allergy symptoms including rhinorrhea. ? ?Past Medical History:  ?Diagnosis Date  ? Abnormal uterine bleeding   ? Acid reflux   ? Asthma   ? High blood pressure   ? High cholesterol   ? Ingrown left big toenail 09/2016  ? recurrent  ? PCOS (polycystic ovarian syndrome)   ? Seasonal allergies   ? ? ?Allergies  ?Allergen Reactions  ? Clindamycin/Lincomycin Anaphylaxis  ?  Trouble breathing?  ? ? ?ROS ?General: Denies fever, chills, night sweats, changes in weight, changes in appetite ?HEENT: Denies headaches, ear pain, changes in vision, rhinorrhea, sore throat  +L ear muffled/decreased hearing.  R ear popping, rhinorrhea ?CV: Denies CP, palpitations, SOB, orthopnea ?Pulm: Denies SOB, cough, wheezing ?GI: Denies abdominal pain, nausea, vomiting, diarrhea, constipation ?GU: Denies dysuria, hematuria, frequency, vaginal discharge ?Msk: Denies muscle cramps, joint pains ?Neuro: Denies weakness, numbness, tingling ?Skin: Denies rashes, bruising ?Psych: Denies depression, anxiety, hallucinations ? ? ?Objective:  ?  ?Blood pressure 132/89, pulse 100, temperature 98.1 ?F (36.7 ?C), temperature source Oral, weight (!) 317 lb 6.4 oz (144 kg), SpO2 98 %. ? ?Gen. Pleasant, well-nourished, in no distress, normal affect   ?HEENT: [place]/AT, face symmetric, conjunctiva clear, no scleral icterus, PERRLA, EOMI, nares patent without drainage, R  TM full.  L canal occluded with soft to moderately thick cerumen deep against TM.  L TM full without signs of infection after irrigation. ?Lungs: no accessory muscle use ?Cardiovascular: RRR,  no peripheral edema ?Neuro:  A&Ox3, CN II-XII intact, normal gait ?Skin:  Warm, no lesions/ rash ? ? ?Wt Readings from Last 3 Encounters:  ?05/12/21 (!) 312 lb (141.5 kg)  ?03/25/21 (!) 301 lb 9.6 oz (136.8 kg)  ?03/16/21 300 lb (136.1 kg)  ? ? ?Lab Results  ?Component Value Date  ? WBC 10.4 05/12/2021  ? HGB 13.3 05/12/2021  ? HCT 40.7 05/12/2021  ? PLT 438 (H) 05/12/2021  ? GLUCOSE 107 (H) 03/16/2021  ? CHOL 205 (H) 01/09/2021  ? TRIG 127.0 01/09/2021  ? HDL 47.90 01/09/2021  ? LDLCALC 132 (H) 01/09/2021  ? ALT 49 (H) 03/16/2021  ? AST 28 03/16/2021  ? NA 138 03/16/2021  ? K 3.6 03/16/2021  ? CL 105 03/16/2021  ? CREATININE 0.71 03/16/2021  ? BUN 8 03/16/2021  ? CO2 23 03/16/2021  ? TSH 2.99 01/09/2021  ? HGBA1C 5.8 01/09/2021  ? ? ?Assessment/Plan: ? ?Decreased hearing of left ear ?-likely 2/2 cerumen impaction. ?-Hearing improved after ear irrigation ? ?Impacted cerumen of left ear ?-Likely causing muffled/decreased hearing ?-Consent obtained.  Left ear irrigated.  Patient tolerated procedure well. ?-OTC Debrox eardrops ?-Limit use of earbuds and refrain from using Q-tips to help prevent future cerumen impaction ? ?Seasonal allergic rhinitis due to pollen ?-continue OTC antihistamine prn and flonase or saline nasal rinse. ? ?Dysfunction of  right eustachian tube ?-Continue OTC antihistamine and nasal spray ? ?F/u as needed ? ?Grier Mitts, MD ?

## 2021-08-03 ENCOUNTER — Ambulatory Visit (INDEPENDENT_AMBULATORY_CARE_PROVIDER_SITE_OTHER): Payer: Managed Care, Other (non HMO) | Admitting: Family Medicine

## 2021-08-03 ENCOUNTER — Encounter: Payer: Self-pay | Admitting: Family Medicine

## 2021-08-03 VITALS — BP 130/90 | HR 103 | Temp 99.2°F | Wt 316.0 lb

## 2021-08-03 DIAGNOSIS — E611 Iron deficiency: Secondary | ICD-10-CM

## 2021-08-03 DIAGNOSIS — R030 Elevated blood-pressure reading, without diagnosis of hypertension: Secondary | ICD-10-CM

## 2021-08-03 NOTE — Progress Notes (Signed)
Subjective:  ? ? Patient ID: Terri Cook, female    DOB: 2000/08/15, 21 y.o.   MRN: [id number] ? ?Chief Complaint  ?Patient presents with  ? Results  ?  Labs drawn at GYN. Ferr[place] was high. Pt advised at that time to take OTC iron & have repeat labs drawn 2 months after. Pt has not started taking OTC due to financial strain.  ? ? ?HPI ?Patient was seen today for ongoing issue.  Patient states her ferritin was low at 5 when she had labs drawn in February at OB/GYN.  Patient endorses heavy menses.  Pt states she was advised to start OTC iron and have labs rechecked in a few months.  Pt unable to obtain OTC iron 2/2 cost.  Currently unemployed.  Labs viewed from that time on pt's phone.  Hemoglobin 13, MCV 73, plts mildly elevated, ferritin 5.  Iron, TIBC, percent sat not noted.  Currently on menses.  Patient endorses chronic fatigue.  Denies dizziness, palpitations. ? ?Past Medical History:  ?Diagnosis Date  ? Abnormal uterine bleeding   ? Acid reflux   ? Asthma   ? High blood pressure   ? High cholesterol   ? Ingrown left big toenail 09/2016  ? recurrent  ? PCOS (polycystic ovarian syndrome)   ? Seasonal allergies   ? ? ?Allergies  ?Allergen Reactions  ? Clindamycin/Lincomycin Anaphylaxis  ?  Trouble breathing?  ? ? ?ROS ?General: Denies fever, chills, night sweats, changes in weight, changes in appetite + fatigue ?HEENT: Denies headaches, ear pain, changes in vision, rhinorrhea, sore throat ?CV: Denies CP, palpitations, SOB, orthopnea ?Pulm: Denies SOB, cough, wheezing ?GI: Denies abdominal pain, nausea, vomiting, diarrhea, constipation ?GU: Denies dysuria, hematuria, frequency, vaginal discharge  + heavy menses ?Msk: Denies muscle cramps, joint pains ?Neuro: Denies weakness, numbness, tingling ?Skin: Denies rashes, bruising ?Psych: Denies depression, anxiety, hallucinations ? ?   ?Objective:  ?  ?Blood pressure 130/90, pulse (!) 103, temperature 99.2 ?F (37.3 ?C), temperature source Oral, weight (!) 316 lb (143.3  kg), SpO2 99 %.Body mass index is 48.05 kg/m?. ? ? ?Gen. Pleasant, well-nourished, in no distress, normal affect   ?HEENT: [place]/AT, face symmetric, conjunctiva clear, no scleral icterus, PERRLA, EOMI, nares patent without drainage ?Lungs: no accessory muscle use ?Cardiovascular: RRR, no peripheral edema ?Musculoskeletal: No deformities, no cyanosis or clubbing, normal tone ?Neuro:  A&Ox3, CN II-XII intact, normal gait ?Skin:  Warm, no lesions/ rash ? ?Wt Readings from Last 3 Encounters:  ?08/03/21 (!) 316 lb (143.3 kg)  ?07/27/21 (!) 317 lb 6.4 oz (144 kg)  ?05/12/21 (!) 312 lb (141.5 kg)  ? ? ?Lab Results  ?Component Value Date  ? WBC 10.4 05/12/2021  ? HGB 13.3 05/12/2021  ? HCT 40.7 05/12/2021  ? PLT 438 (H) 05/12/2021  ? GLUCOSE 107 (H) 03/16/2021  ? CHOL 205 (H) 01/09/2021  ? TRIG 127.0 01/09/2021  ? HDL 47.90 01/09/2021  ? LDLCALC 132 (H) 01/09/2021  ? ALT 49 (H) 03/16/2021  ? AST 28 03/16/2021  ? NA 138 03/16/2021  ? K 3.6 03/16/2021  ? CL 105 03/16/2021  ? CREATININE 0.71 03/16/2021  ? BUN 8 03/16/2021  ? CO2 23 03/16/2021  ? TSH 2.99 01/09/2021  ? HGBA1C 5.8 01/09/2021  ? ? ?Assessment/Plan: ? ?Iron deficiency ?-Likely due to blood loss from heavy menses and diet. ?-ferritin 5 on 2/ 10/2021 from labs done by OB/Gyn with normal hgb 13.3, MCV 75.8, platelets 438.  No TIBC, iron, percent sat obtained. ?-pt  advised to obtain OTC iron by OB/Gyn and have labs rechecked in 2 months ?-discussed eating iron rich foods.  Given handout. ?-Information printed out regarding cost of OTC iron supplements.  ferrous sulfate 325 mg 200 tabs for $5 at local big box store. ?-Given precautions as iron can cause stools to appear dark in color and cause constipation. ?-Patient advised to follow-up with OB/GYN ? ?Elevated blood pressure reading without diagnosis of HTN ?-Recheck BP ?-Lifestyle modifications including decreasing sodium intake and weight loss. ?-Continue to monitor.  For continued elevation start medication. ? ?Morbid  obesity ([place]) ?-Body mass index is 48.05 kg/m?. ?-Lifestyle modifications encouraged ?-Consider weight loss medications however cost currently a factor for pt. ?-Continue to monitor ? ?F/u prn ? ?Grier Mitts, MD ?

## 2021-09-02 ENCOUNTER — Encounter: Payer: Self-pay | Admitting: Family Medicine

## 2021-09-02 ENCOUNTER — Ambulatory Visit (INDEPENDENT_AMBULATORY_CARE_PROVIDER_SITE_OTHER): Payer: Managed Care, Other (non HMO) | Admitting: Family Medicine

## 2021-09-02 VITALS — BP 118/82 | HR 105 | Temp 99.2°F | Ht 68.0 in | Wt 313.4 lb

## 2021-09-02 DIAGNOSIS — M79602 Pain in left arm: Secondary | ICD-10-CM | POA: Diagnosis not present

## 2021-09-02 DIAGNOSIS — S46212A Strain of muscle, fascia and tendon of other parts of biceps, left arm, initial encounter: Secondary | ICD-10-CM | POA: Diagnosis not present

## 2021-09-02 MED ORDER — CYCLOBENZAPRINE HCL 5 MG PO TABS: 5.0000 mg | ORAL_TABLET | Freq: Every evening | ORAL | 0 refills | Status: DC | PRN

## 2021-09-02 MED ORDER — MELOXICAM 7.5 MG PO TABS: 7.5000 mg | ORAL_TABLET | Freq: Every day | ORAL | 0 refills | Status: DC

## 2021-09-02 NOTE — Progress Notes (Signed)
Subjective:    Patient ID: Terri Cook, female    DOB: 2001/02/28, 21 y.o.   MRN: [id number]  Chief Complaint  Patient presents with   Arm Pain    Patient complains of left upper arm pain x2 days, noticed pain after putting pressure onto left arm while getting out of bed    HPI Patient was seen today for acute concern. Pt with L upper arm pain x 2 days after leaning on elbows to push herself out of bed.  Pt denies hearing any pops, clicks, or tears.  States lateral/posterior L upper arm slightly swollen.  Pt tried ibuprofen.  Arm hurts with movement and certain positions.  Pain is noted as stabbing, sharp, 6/10.  Pt unable to lift arm above head without discomfort.  Past Medical History:  Diagnosis Date   Abnormal uterine bleeding    Acid reflux    Asthma    High blood pressure    High cholesterol    Ingrown left big toenail 09/2016   recurrent   PCOS (polycystic ovarian syndrome)    Seasonal allergies     Allergies  Allergen Reactions   Clindamycin/Lincomycin Anaphylaxis    Trouble breathing?    ROS General: Denies fever, chills, night sweats, changes in weight, changes in appetite HEENT: Denies headaches, ear pain, changes in vision, rhinorrhea, sore throat CV: Denies CP, palpitations, SOB, orthopnea Pulm: Denies SOB, cough, wheezing GI: Denies abdominal pain, nausea, vomiting, diarrhea, constipation GU: Denies dysuria, hematuria, frequency, vaginal discharge Msk: Denies muscle cramps, joint pains +L arm pain Neuro: Denies weakness, numbness, tingling Skin: Denies rashes, bruising Psych: Denies depression, anxiety, hallucinations      Objective:    Blood pressure 118/82, pulse (!) 105, temperature 99.2 F (37.3 C), temperature source Oral, height '5\' 8"'$  (1.727 m), weight (!) 313 lb 6.4 oz (142.2 kg), last menstrual period 08/26/2021, SpO2 98 %.   Gen. Pleasant, well-nourished, in no distress, normal affect   HEENT: [place]/AT, face symmetric, conjunctiva clear, no  scleral icterus, PERRLA, EOMI, nares patent without drainage Lungs: no accessory muscle use, CTAB, no wheezes or rales Cardiovascular: RRR, no m/r/g, no peripheral edema Musculoskeletal: Mild edema of L lateral bicep.  Limited active and passive ROM of LUE 2/2 discomfort.  LUE adduction to 90 degrees with pain.  No deformities, no cyanosis or clubbing, normal tone Neuro:  A&Ox3, CN II-XII intact, normal gait Skin:  Warm, no lesions/ rash   Wt Readings from Last 3 Encounters:  09/02/21 (!) 313 lb 6.4 oz (142.2 kg)  08/03/21 (!) 316 lb (143.3 kg)  07/27/21 (!) 317 lb 6.4 oz (144 kg)    Lab Results  Component Value Date   WBC 10.4 05/12/2021   HGB 13.3 05/12/2021   HCT 40.7 05/12/2021   PLT 438 (H) 05/12/2021   GLUCOSE 107 (H) 03/16/2021   CHOL 205 (H) 01/09/2021   TRIG 127.0 01/09/2021   HDL 47.90 01/09/2021   LDLCALC 132 (H) 01/09/2021   ALT 49 (H) 03/16/2021   AST 28 03/16/2021   NA 138 03/16/2021   K 3.6 03/16/2021   CL 105 03/16/2021   CREATININE 0.71 03/16/2021   BUN 8 03/16/2021   CO2 23 03/16/2021   TSH 2.99 01/09/2021   HGBA1C 5.8 01/09/2021    Assessment/Plan:  Left arm pain  - Plan: Ambulatory referral to Orthopedic Surgery, meloxicam (MOBIC) 7.5 MG tablet, cyclobenzaprine (FLEXERIL) 5 MG tablet  Strain of left biceps muscle, initial encounter - Plan: Ambulatory referral to Orthopedic Surgery,  meloxicam (MOBIC) 7.5 MG tablet, cyclobenzaprine (FLEXERIL) 5 MG tablet  Discussed RICE, heat, NSAIDs or Tylenol.  Can also use topical analgesics.  RX for msk relaxer and Mobic sent to pharmacy.  Given limited ROM will have pt f/u with Ortho. Given handouts,  F/u prn  Grier Mitts, MD

## 2021-09-21 ENCOUNTER — Ambulatory Visit (INDEPENDENT_AMBULATORY_CARE_PROVIDER_SITE_OTHER): Payer: Managed Care, Other (non HMO) | Admitting: Family Medicine

## 2021-09-21 ENCOUNTER — Encounter: Payer: Self-pay | Admitting: Family Medicine

## 2021-09-21 VITALS — BP 124/80 | HR 113 | Temp 98.7°F | Wt 322.2 lb

## 2021-09-21 DIAGNOSIS — W57XXXA Bitten or stung by nonvenomous insect and other nonvenomous arthropods, initial encounter: Secondary | ICD-10-CM | POA: Diagnosis not present

## 2021-09-21 DIAGNOSIS — S40869A Insect bite (nonvenomous) of unspecified upper arm, initial encounter: Secondary | ICD-10-CM

## 2021-09-21 DIAGNOSIS — L299 Pruritus, unspecified: Secondary | ICD-10-CM

## 2021-09-21 DIAGNOSIS — S30861A Insect bite (nonvenomous) of abdominal wall, initial encounter: Secondary | ICD-10-CM

## 2021-09-21 DIAGNOSIS — S30860A Insect bite (nonvenomous) of lower back and pelvis, initial encounter: Secondary | ICD-10-CM

## 2021-09-21 NOTE — Patient Instructions (Addendum)
You can use cortisone 1% cream or Benadryl cream as needed on the insect bites.  If you are feeling particularly itchy all over you can take an over-the-counter allergy medicine such as Zyrtec or Allegra to help stop the itchy sensation.  Try to avoid scratching your skin as she can get a secondary infection from the bacteria under your fingernails.

## 2021-09-21 NOTE — Progress Notes (Signed)
Subjective:    Patient ID: Terri Cook, female    DOB: September 07, 2000, 21 y.o.   MRN: [id number]  Chief Complaint  Patient presents with   bumps    Under left arm, has been there a while. States usually gets them in summer, but has not had the itching before. Also has some on stomach.     HPI Patient was seen today for acute concern.  Pt with pruritic bumps on abdomen and back.  Noticed several days ago.  No lesions on LEs.  Has not tried anything for them.  Initially thought they could be psoriasis as it runs in the family.  Pt does not staying with friends who have pets.  Pt states the dogs are being treated for fleas.    Past Medical History:  Diagnosis Date   Abnormal uterine bleeding    Acid reflux    Asthma    High blood pressure    High cholesterol    Ingrown left big toenail 09/2016   recurrent   PCOS (polycystic ovarian syndrome)    Seasonal allergies     Allergies  Allergen Reactions   Clindamycin/Lincomycin Anaphylaxis    Trouble breathing?    ROS General: Denies fever, chills, night sweats, changes in weight, changes in appetite HEENT: Denies headaches, ear pain, changes in vision, rhinorrhea, sore throat CV: Denies CP, palpitations, SOB, orthopnea Pulm: Denies SOB, cough, wheezing GI: Denies abdominal pain, nausea, vomiting, diarrhea, constipation GU: Denies dysuria, hematuria, frequency, vaginal discharge Msk: Denies muscle cramps, joint pains Neuro: Denies weakness, numbness, tingling Skin: Denies rashes, bruising + pruritic bumps on abdomen, upper extremities and back Psych: Denies depression, anxiety, hallucinations     Objective:    Blood pressure 124/80, pulse (!) 113, temperature 98.7 F (37.1 C), temperature source Oral, weight (!) 322 lb 3.2 oz (146.1 kg), last menstrual period 08/26/2021, SpO2 98 %.  Gen. Pleasant, well-nourished, in no distress, normal affect   HEENT: [place]/AT, face symmetric, conjunctiva clear, no scleral icterus, PERRLA, EOMI,  nares patent without drainage Lungs: no accessory muscle use, CTAB, no wheezes or rales Cardiovascular: RRR, no m/r/g, no peripheral edema Musculoskeletal: No deformities, no cyanosis or clubbing, normal tone Neuro:  A&Ox3, CN II-XII intact, normal gait Skin:  Warm, dry, intact.  Erythematous raised papules with dried central punctum on abdomen, posterior upper arm, back, and axilla..  No pustules or lesions in various stages noted.  Wt Readings from Last 3 Encounters:  09/21/21 (!) 322 lb 3.2 oz (146.1 kg)  09/02/21 (!) 313 lb 6.4 oz (142.2 kg)  08/03/21 (!) 316 lb (143.3 kg)    Lab Results  Component Value Date   WBC 10.4 05/12/2021   HGB 13.3 05/12/2021   HCT 40.7 05/12/2021   PLT 438 (H) 05/12/2021   GLUCOSE 107 (H) 03/16/2021   CHOL 205 (H) 01/09/2021   TRIG 127.0 01/09/2021   HDL 47.90 01/09/2021   LDLCALC 132 (H) 01/09/2021   ALT 49 (H) 03/16/2021   AST 28 03/16/2021   NA 138 03/16/2021   K 3.6 03/16/2021   CL 105 03/16/2021   CREATININE 0.71 03/16/2021   BUN 8 03/16/2021   CO2 23 03/16/2021   TSH 2.99 01/09/2021   HGBA1C 5.8 01/09/2021    Assessment/Plan:  Multiple insect bites  Pruritus  As patient recently stayed with friends with pets being treated for fleas lesions likely 2/2 flea bites.  Also consider other insects like mosquitoes.  Discussed expectant management of symptoms with OTC antihistamine/anti-itch cream.  Patient can use cortisone 1% or Benadryl cream, pruritic lesions.  For intense pruritus start OTC p.o. antihistamine such as Allegra or Zyrtec.  Patient advised to avoid scratching lesions as may cause secondary infection.  Given precautions  F/u as needed  Grier Mitts, MD

## 2021-09-30 ENCOUNTER — Ambulatory Visit (INDEPENDENT_AMBULATORY_CARE_PROVIDER_SITE_OTHER): Payer: Managed Care, Other (non HMO) | Admitting: Bariatrics

## 2021-09-30 ENCOUNTER — Encounter (INDEPENDENT_AMBULATORY_CARE_PROVIDER_SITE_OTHER): Payer: Self-pay | Admitting: Bariatrics

## 2021-09-30 VITALS — BP 120/81 | HR 87 | Temp 97.7°F | Ht 68.0 in | Wt 314.0 lb

## 2021-09-30 DIAGNOSIS — E781 Pure hyperglyceridemia: Secondary | ICD-10-CM | POA: Diagnosis not present

## 2021-09-30 DIAGNOSIS — Z1331 Encounter for screening for depression: Secondary | ICD-10-CM | POA: Diagnosis not present

## 2021-09-30 DIAGNOSIS — E8881 Metabolic syndrome: Secondary | ICD-10-CM

## 2021-09-30 DIAGNOSIS — R0602 Shortness of breath: Secondary | ICD-10-CM

## 2021-09-30 DIAGNOSIS — E282 Polycystic ovarian syndrome: Secondary | ICD-10-CM

## 2021-09-30 DIAGNOSIS — D508 Other iron deficiency anemias: Secondary | ICD-10-CM

## 2021-09-30 DIAGNOSIS — K219 Gastro-esophageal reflux disease without esophagitis: Secondary | ICD-10-CM | POA: Diagnosis not present

## 2021-09-30 DIAGNOSIS — R5383 Other fatigue: Secondary | ICD-10-CM

## 2021-09-30 DIAGNOSIS — E559 Vitamin D deficiency, unspecified: Secondary | ICD-10-CM

## 2021-09-30 DIAGNOSIS — E611 Iron deficiency: Secondary | ICD-10-CM

## 2021-09-30 DIAGNOSIS — Z6841 Body Mass Index (BMI) 40.0 and over, adult: Secondary | ICD-10-CM | POA: Insufficient documentation

## 2021-10-01 ENCOUNTER — Telehealth (INDEPENDENT_AMBULATORY_CARE_PROVIDER_SITE_OTHER): Payer: Self-pay

## 2021-10-01 ENCOUNTER — Encounter (INDEPENDENT_AMBULATORY_CARE_PROVIDER_SITE_OTHER): Payer: Self-pay

## 2021-10-01 ENCOUNTER — Telehealth: Payer: Self-pay | Admitting: Physician Assistant

## 2021-10-01 DIAGNOSIS — E611 Iron deficiency: Secondary | ICD-10-CM

## 2021-10-01 LAB — LIPID PANEL WITH LDL/HDL RATIO
Cholesterol, Total: 209 mg/dL — ABNORMAL HIGH (ref 100–199)
HDL: 38 mg/dL — ABNORMAL LOW (ref >39)
LDL Chol Calc (NIH): 131 mg/dL — ABNORMAL HIGH (ref 0–99)
LDL/HDL Ratio: 3.4 ratio — ABNORMAL HIGH (ref 0.0–3.2)
Triglycerides: 225 mg/dL — ABNORMAL HIGH (ref 0–149)
VLDL Cholesterol Cal: 40 mg/dL (ref 5–40)

## 2021-10-01 LAB — HEMOGLOBIN A1C
Est. average glucose Bld gHb Est-mCnc: 111 mg/dL
Hgb A1c MFr Bld: 5.5 % (ref 4.8–5.6)

## 2021-10-01 LAB — ANEMIA PANEL
Ferritin: 14 ng/mL — ABNORMAL LOW (ref 15–150)
Folate, Hemolysate: 379 ng/mL
Folate, RBC: 905 ng/mL (ref >498)
Hematocrit: 41.9 % (ref 34.0–46.6)
Iron Saturation: 8 % — CL (ref 15–55)
Iron: 41 ug/dL (ref 27–159)
Retic Ct Pct: 1.9 % (ref 0.6–2.6)
Total Iron Binding Capacity: 498 ug/dL — ABNORMAL HIGH (ref 250–450)
UIBC: 457 ug/dL — ABNORMAL HIGH (ref 131–425)
Vitamin B-12: 359 pg/mL (ref 232–1245)

## 2021-10-01 LAB — TSH+T4F+T3FREE
Free T4: 0.99 ng/dL (ref 0.82–1.77)
T3, Free: 3.6 pg/mL (ref 2.0–4.4)
TSH: 2.5 u[IU]/mL (ref 0.450–4.500)

## 2021-10-01 LAB — COMPREHENSIVE METABOLIC PANEL
ALT: 52 IU/L — ABNORMAL HIGH (ref 0–32)
AST: 29 IU/L (ref 0–40)
Albumin/Globulin Ratio: 1.8 (ref 1.2–2.2)
Albumin: 4.6 g/dL (ref 3.9–5.0)
Alkaline Phosphatase: 89 IU/L (ref 42–106)
BUN/Creatinine Ratio: 9 (ref 9–23)
BUN: 7 mg/dL (ref 6–20)
Bilirubin Total: 1 mg/dL (ref 0.0–1.2)
CO2: 20 mmol/L (ref 20–29)
Calcium: 9.6 mg/dL (ref 8.7–10.2)
Chloride: 99 mmol/L (ref 96–106)
Creatinine, Ser: 0.74 mg/dL (ref 0.57–1.00)
Globulin, Total: 2.6 g/dL (ref 1.5–4.5)
Glucose: 86 mg/dL (ref 70–99)
Potassium: 4.3 mmol/L (ref 3.5–5.2)
Sodium: 138 mmol/L (ref 134–144)
Total Protein: 7.2 g/dL (ref 6.0–8.5)
eGFR: 119 mL/min/{1.73_m2} (ref >59)

## 2021-10-01 LAB — VITAMIN D 25 HYDROXY (VIT D DEFICIENCY, FRACTURES): Vit D, 25-Hydroxy: 9.7 ng/mL — ABNORMAL LOW (ref 30.0–100.0)

## 2021-10-01 LAB — INSULIN, RANDOM: INSULIN: 24.8 u[IU]/mL (ref 2.6–24.9)

## 2021-10-01 NOTE — Addendum Note (Signed)
Addended byEzzard Flax T on: 10/01/2021 11:00 AM   Modules accepted: Orders

## 2021-10-01 NOTE — Telephone Encounter (Signed)
Attempted to contact patient. No voicemail available.

## 2021-10-01 NOTE — Telephone Encounter (Signed)
-----   Message from Georgia Lopes, DO sent at 10/01/2021  8:51 AM EDT ----- Regarding: Low iron and ferritin Call pt. She is still anemic ( iron levels and ferritin) are low despite her taking iron. I would like to send her to hematology for an evaluation. Has she seen a hematologist in the past ? Has she had an iron infusion ? ----- Message ----- From: Interface, [facility] Lab Results In Sent: 10/01/2021   7:39 AM EDT To: Georgia Lopes, DO

## 2021-10-01 NOTE — Telephone Encounter (Signed)
Spoke to patient. She stated she didn't have a hematologist and has never had an iron infusion. Put in a referral for hematology per provider.

## 2021-10-01 NOTE — Telephone Encounter (Signed)
Scheduled appt per 6/29 referral. Pt is aware of appt date and time. Pt is aware to arrive 15 mins prior to appt time and to bring and updated insurance card. Pt is aware of appt location.   

## 2021-10-05 NOTE — Progress Notes (Unsigned)
Chief Complaint:   OBESITY Terri Cook (MR# [id number]) is a 21 y.o. female who presents for evaluation and treatment of obesity and related comorbidities. Current BMI is Body mass index is 47.74 kg/m. Terri Cook has been struggling with her weight for many years and has been unsuccessful in either losing weight, maintaining weight loss, or reaching her healthy weight goal.  Terri Cook sometimes likes to cook, but she notes time as an obstacle.  She sometimes craves chocolate.  Her mother comes to our clinic as well.  Terri Cook is currently in the action stage of change and ready to dedicate time achieving and maintaining a healthier weight. Terri Cook is interested in becoming our patient and working on intensive lifestyle modifications including (but not limited to) diet and exercise for weight loss.  Terri Cook's habits were reviewed today and are as follows: Her family eats meals together, she thinks her family will eat healthier with her, her desired weight loss is 114 lbs, she has been heavy most of her life, she started gaining weight after death in her family, her heaviest weight ever was 313.6 pounds, she has significant food cravings issues, she snacks frequently in the evenings, she is frequently drinking liquids with calories, she has problems with excessive hunger, she frequently eats larger portions than normal, and she struggles with emotional eating.  Depression Screen Terri Cook's Food and Mood (modified PHQ-9) score was 4.     09/30/2021    8:23 AM  Depression screen PHQ 2/9  Decreased Interest 1  Down, Depressed, Hopeless 0  PHQ - 2 Score 1  Altered sleeping 1  Tired, decreased energy 1  Change in appetite 1  Feeling bad or failure about yourself  0  Trouble concentrating 0  Moving slowly or fidgety/restless 0  Suicidal thoughts 0  PHQ-9 Score 4  Difficult doing work/chores Not difficult at all   Subjective:   1. Other fatigue Terri Cook admits to daytime somnolence and admits to waking up  still tired. Patient has a history of symptoms of daytime fatigue and morning fatigue. Terri Cook generally gets 8 or 10 hours of sleep per night, and states that she has nightime awakenings and generally restful sleep. Snoring is present. Apneic episodes are not present. Epworth Sleepiness Score is 11.   2. SOB (shortness of breath) on exertion Terri Cook notes increasing shortness of breath with exercising and seems to be worsening over time with weight gain. She notes getting out of breath sooner with activity than she used to. This has not gotten worse recently. Terri Cook denies shortness of breath at rest or orthopnea.  RMR is higher than expected.  RMR 2448, expected RMR 2317.  3. Gastroesophageal reflux disease, unspecified whether esophagitis present Terri Cook is taking omeprazole.  4. Vitamin D deficiency Terri Cook is taking vitamin D.  5. PCOS (polycystic ovarian syndrome) Terri Cook has insulin resistance.   6. Hypertriglyceridemia Terri Cook is not on medications currently.   7. Insulin resistance Terri Cook has polycystic ovarian syndrome.  8. Other iron deficiency anemia Terri Cook is on iron pills.   Assessment/Plan:   1. Other fatigue Terri Cook does feel that her weight is causing her energy to be lower than it should be. Fatigue may be related to obesity, depression or many other causes. Labs will be ordered, and in the meanwhile, Terri Cook will focus on self care including making healthy food choices, increasing physical activity and focusing on stress reduction.  - EKG 12-Lead - Comprehensive metabolic panel - [id number]  2. SOB (shortness of breath)  on exertion Terri Cook does feel that she gets out of breath more easily that she used to when she exercises. Terri Cook's shortness of breath appears to be obesity related and exercise induced. She has agreed to work on weight loss and gradually increase exercise to treat her exercise induced shortness of breath. Will continue to monitor closely.  3. Gastroesophageal reflux disease,  unspecified whether esophagitis present Terri Cook will continue omeprazole, and she will watch for trigger foods.  4. Vitamin D deficiency We will check labs today, and we will follow-up at Terri Cook's next visit.   - VITAMIN D 25 Hydroxy (Vit-D Deficiency, Fractures)  5. PCOS (polycystic ovarian syndrome) We will check labs today, and we will follow-up at Terri Cook's next visit.   - Hemoglobin A1c - Insulin, random  6. Hypertriglyceridemia We will check labs today, and we will follow-up at Terri Cook's next visit.   - Comprehensive metabolic panel - Lipid Panel With LDL/HDL Ratio  7. Insulin resistance We will check labs today, and we will follow-up at Terri Cook's next visit.   - Hemoglobin A1c - Insulin, random  8. Other iron deficiency anemia We will check labs today, and we will follow-up at Terri Cook's next visit.   - Ferritin - Anemia panel  9. Depression screening Terri Cook had a negative depression screening. Depression is commonly associated with obesity and often results in emotional eating behaviors. We will monitor this closely and work on CBT to help improve the non-hunger eating patterns. Referral to Psychology may be required if no improvement is seen as she continues in our clinic.  10. Class 3 severe obesity with serious comorbidity and body mass index (BMI) of 45.0 to 49.9 in adult, unspecified obesity type (HCC) Terri Cook is currently in the action stage of change and her goal is to continue with weight loss efforts. I recommend Terri Cook begin the structured treatment plan as follows:  She has agreed to the Category 4 Plan.  Mindful eating was discussed and she is to eliminate sugary drinks.  Exercise goals: No exercise has been prescribed at this time.   Behavioral modification strategies: increasing lean protein intake, decreasing simple carbohydrates, increasing vegetables, increasing water intake, decreasing eating out, no skipping meals, meal planning and cooking strategies, keeping healthy  foods in the home, and planning for success.  She was informed of the importance of frequent follow-up visits to maximize her success with intensive lifestyle modifications for her multiple health conditions. She was informed we would discuss her lab results at her next visit unless there is a critical issue that needs to be addressed sooner. Raymie agreed to keep her next visit at the agreed upon time to discuss these results.  Objective:   Blood pressure 120/81, pulse 87, temperature 97.7 F (36.5 C), height '5\' 8"'$  (1.727 m), weight (!) 314 lb (142.4 kg), last menstrual period 09/27/2021, SpO2 99 %. Body mass index is 47.74 kg/m.  EKG: Normal sinus rhythm, rate 89 BPM.  Indirect Calorimeter completed today shows a VO2 of 354 and a REE of 2448.  Her calculated basal metabolic rate is 0867 thus her basal metabolic rate is better than expected.  General: Cooperative, alert, well developed, in no acute distress. HEENT: Conjunctivae and lids unremarkable. Cardiovascular: Regular rhythm.  Lungs: Normal work of breathing. Neurologic: No focal deficits.   Lab Results  Component Value Date   CREATININE 0.74 09/30/2021   BUN 7 09/30/2021   NA 138 09/30/2021   K 4.3 09/30/2021   CL 99 09/30/2021   CO2 20  09/30/2021   Lab Results  Component Value Date   ALT 52 (H) 09/30/2021   AST 29 09/30/2021   ALKPHOS 89 09/30/2021   BILITOT 1.0 09/30/2021   Lab Results  Component Value Date   HGBA1C 5.5 09/30/2021   HGBA1C 5.8 01/09/2021   HGBA1C 5.7 08/14/2020   HGBA1C 5.4 01/17/2020   HGBA1C 5.0 04/04/2018   Lab Results  Component Value Date   INSULIN 24.8 09/30/2021   Lab Results  Component Value Date   TSH 2.500 09/30/2021   Lab Results  Component Value Date   CHOL 209 (H) 09/30/2021   HDL 38 (L) 09/30/2021   LDLCALC 131 (H) 09/30/2021   TRIG 225 (H) 09/30/2021   CHOLHDL 4 01/09/2021   Lab Results  Component Value Date   WBC 10.4 05/12/2021   HGB 13.3 05/12/2021   HCT 41.9  09/30/2021   MCV 75.8 (L) 05/12/2021   PLT 438 (H) 05/12/2021   Lab Results  Component Value Date   IRON 41 09/30/2021   TIBC 498 (H) 09/30/2021   FERRITIN 14 (L) 09/30/2021   Attestation Statements:   Reviewed by clinician on day of visit: allergies, medications, problem list, medical history, surgical history, family history, social history, and previous encounter notes.   Wilhemena Durie, am acting as Location manager for [employer], DO.  I have reviewed the above documentation for accuracy and completeness, and I agree with the above. Jearld Lesch, DO

## 2021-10-07 ENCOUNTER — Encounter: Payer: Self-pay | Admitting: Family Medicine

## 2021-10-07 ENCOUNTER — Ambulatory Visit (INDEPENDENT_AMBULATORY_CARE_PROVIDER_SITE_OTHER): Payer: Managed Care, Other (non HMO) | Admitting: Family Medicine

## 2021-10-07 ENCOUNTER — Encounter (INDEPENDENT_AMBULATORY_CARE_PROVIDER_SITE_OTHER): Payer: Self-pay | Admitting: Bariatrics

## 2021-10-07 VITALS — BP 124/92 | HR 91 | Temp 98.4°F | Wt 316.4 lb

## 2021-10-07 DIAGNOSIS — W57XXXA Bitten or stung by nonvenomous insect and other nonvenomous arthropods, initial encounter: Secondary | ICD-10-CM

## 2021-10-07 DIAGNOSIS — L739 Follicular disorder, unspecified: Secondary | ICD-10-CM

## 2021-10-07 DIAGNOSIS — S70361A Insect bite (nonvenomous), right thigh, initial encounter: Secondary | ICD-10-CM | POA: Diagnosis not present

## 2021-10-07 MED ORDER — AMOXICILLIN 500 MG PO TABS: 500.0000 mg | ORAL_TABLET | Freq: Two times a day (BID) | ORAL | 0 refills | Status: AC

## 2021-10-07 MED ORDER — FLUCONAZOLE 150 MG PO TABS: ORAL_TABLET | ORAL | 0 refills | Status: DC

## 2021-10-07 NOTE — Patient Instructions (Addendum)
A prescription for amoxicillin was sent to your pharmacy.  This is an antibiotic to help treat a bacterial infection that is causing the folliculitis.  Another prescription for Diflucan was also sent into your pharmacy.  This is a pill that helps treat yeast that may be causing a skin infection.  For the Diflucan, take 1 tab now and repeat the dose in 3 days.  For worsening or continued symptoms and notify clinic and we will place a referral to the dermatologist.  For the insect bites on the back of your leg you can use topical Benadryl cream or cortisone cream if needed for itching.  You can also take an allergy pill for itching if needed.

## 2021-10-07 NOTE — Progress Notes (Signed)
Subjective:    Patient ID: Terri Cook, female    DOB: [birth year], 21 y.o.   MRN: [id number]  Chief Complaint  Patient presents with   Insect Bite    On the back of R leg. The bump grew in size. Denied pain.    Mass    Pt c/o of bump under L arm. Itchy.     HPI Patient was seen today for skin concerns.  Pt with several mosquito bites on posterior R thigh.  States noticed areas a few days ago.  Areas were like large, red welps, but are improving-decreasing in size and not as pruritic.  Pt also with several erythematous, pruritic lesions in and surrounding L axilla.  Present x several days, areas start off as flat red spots then develop a bump/pimple.  Several bumps in axilla have created a larger raised area. Bumps do not burn, are not drainage.  Pt using same soaps, lotions, and detergents. Started using Aveno body wash today, but bumps have been present.  Using [place] deodorant.  Previously around a dog that had fleas, but denies recent contact.    Past Medical History:  Diagnosis Date   Abnormal uterine bleeding    Acid reflux    Anemia    Asthma    Fatty liver    High blood pressure    High cholesterol    Ingrown left big toenail 09/2016   recurrent   PCOS (polycystic ovarian syndrome)    Pre-diabetes    Seasonal allergies     Allergies  Allergen Reactions   Clindamycin/Lincomycin Anaphylaxis    Trouble breathing?    ROS General: Denies fever, chills, night sweats, changes in weight, changes in appetite HEENT: Denies headaches, ear pain, changes in vision, rhinorrhea, sore throat CV: Denies CP, palpitations, SOB, orthopnea Pulm: Denies SOB, cough, wheezing GI: Denies abdominal pain, nausea, vomiting, diarrhea, constipation GU: Denies dysuria, hematuria, frequency, vaginal discharge Msk: Denies muscle cramps, joint pains Neuro: Denies weakness, numbness, tingling Skin: Denies rashes, bruising  +pruritic bumps in L axilla, bumps on R thigh Psych: Denies depression,  anxiety, hallucinations     Objective:    Blood pressure (!) 124/92, pulse 91, temperature 98.4 F (36.9 C), temperature source Oral, weight (!) 316 lb 6.4 oz (143.5 kg), last menstrual period 09/27/2021, SpO2 98 %.  Gen. Pleasant, well-nourished, in no distress, normal affect   HEENT: [place]/AT, face symmetric, conjunctiva clear, no scleral icterus, PERRLA, EOMI, nares patent without drainage Lungs: no accessory muscle use Cardiovascular: RRR, no peripheral edema Musculoskeletal: No deformities, no cyanosis or clubbing, normal tone Neuro:  A&Ox3, CN II-XII intact, normal gait Skin:  Warm, dry, intact.  Scattered 3-4 mm papules and pustules with erythema proximal to L axilla. Lesions surrounding hair follicle.  Several lesions coalescing to form larger ridge like areas due to creases in skin.  4-5 erythematous papules on posterior R thigh 8 mm-10 mm in diameter.  Dried, healing, faintly erythematous lesions of R medial thigh and R posterior back/R axilla   Wt Readings from Last 3 Encounters:  10/07/21 (!) 316 lb 6.4 oz (143.5 kg)  09/30/21 (!) 314 lb (142.4 kg)  09/21/21 (!) 322 lb 3.2 oz (146.1 kg)    Lab Results  Component Value Date   WBC 10.4 05/12/2021   HGB 13.3 05/12/2021   HCT 41.9 09/30/2021   PLT 438 (H) 05/12/2021   GLUCOSE 86 09/30/2021   CHOL 209 (H) 09/30/2021   TRIG 225 (H) 09/30/2021   HDL 38 (  L) 09/30/2021   LDLCALC 131 (H) 09/30/2021   ALT 52 (H) 09/30/2021   AST 29 09/30/2021   NA 138 09/30/2021   K 4.3 09/30/2021   CL 99 09/30/2021   CREATININE 0.74 09/30/2021   BUN 7 09/30/2021   CO2 20 09/30/2021   TSH 2.500 09/30/2021   HGBA1C 5.5 09/30/2021    Assessment/Plan:  Folliculitis  -likely 2/2 increased sweating due to hot weather. -Concern for secondary bacterial infection 2/2 increased scratching. -given area of lesions will treat with po abx and antifungal  -continue to use gentle soaps, lotions, detergents. -given handout -given precautions -For  continued or worsening symptoms will place referral with dermatology. - Plan: amoxicillin (AMOXIL) 500 MG tablet, fluconazole (DIFLUCAN) 150 MG tablet  Multiple insect bites -use bug spray when outdoors -have pets treated for fleas -OTC antihistamine cream or pill prn.  F/u prn  Grier Mitts, MD

## 2021-10-14 ENCOUNTER — Ambulatory Visit (INDEPENDENT_AMBULATORY_CARE_PROVIDER_SITE_OTHER): Payer: Managed Care, Other (non HMO) | Admitting: Bariatrics

## 2021-10-14 ENCOUNTER — Encounter (INDEPENDENT_AMBULATORY_CARE_PROVIDER_SITE_OTHER): Payer: Self-pay | Admitting: Bariatrics

## 2021-10-14 VITALS — BP 109/78 | HR 100 | Temp 98.0°F | Ht 68.0 in | Wt 312.0 lb

## 2021-10-14 DIAGNOSIS — E88819 Insulin resistance, unspecified: Secondary | ICD-10-CM

## 2021-10-14 DIAGNOSIS — E611 Iron deficiency: Secondary | ICD-10-CM | POA: Diagnosis not present

## 2021-10-14 DIAGNOSIS — Z6841 Body Mass Index (BMI) 40.0 and over, adult: Secondary | ICD-10-CM

## 2021-10-14 DIAGNOSIS — R7401 Elevation of levels of liver transaminase levels: Secondary | ICD-10-CM | POA: Diagnosis not present

## 2021-10-14 DIAGNOSIS — E781 Pure hyperglyceridemia: Secondary | ICD-10-CM

## 2021-10-14 DIAGNOSIS — E559 Vitamin D deficiency, unspecified: Secondary | ICD-10-CM

## 2021-10-14 DIAGNOSIS — E8881 Metabolic syndrome: Secondary | ICD-10-CM | POA: Diagnosis not present

## 2021-10-14 DIAGNOSIS — E669 Obesity, unspecified: Secondary | ICD-10-CM

## 2021-10-14 DIAGNOSIS — E66813 Obesity, class 3: Secondary | ICD-10-CM

## 2021-10-14 MED ORDER — VITAMIN D (ERGOCALCIFEROL) 1.25 MG (50000 UNIT) PO CAPS: 50000.0000 [IU] | ORAL_CAPSULE | ORAL | 0 refills | Status: DC

## 2021-10-15 ENCOUNTER — Encounter: Payer: Self-pay | Admitting: Family Medicine

## 2021-10-15 ENCOUNTER — Ambulatory Visit (INDEPENDENT_AMBULATORY_CARE_PROVIDER_SITE_OTHER): Payer: Managed Care, Other (non HMO) | Admitting: Family Medicine

## 2021-10-15 VITALS — BP 124/86 | HR 103 | Temp 98.4°F | Wt 315.2 lb

## 2021-10-15 DIAGNOSIS — L309 Dermatitis, unspecified: Secondary | ICD-10-CM | POA: Diagnosis not present

## 2021-10-15 MED ORDER — CLOBETASOL PROPIONATE 0.05 % EX CREA: 1.0000 | TOPICAL_CREAM | Freq: Two times a day (BID) | CUTANEOUS | 0 refills | Status: DC

## 2021-10-15 NOTE — Progress Notes (Signed)
Subjective:    Patient ID: Terri Cook, female    DOB: [birth year], 21 y.o.   MRN: [id number]  Chief Complaint  Patient presents with   Follow-up    Pt is here to follow up on the bump that she was seen on 10/07/21. Patient updated that the bump had cracked and open and not going away. She states it is painful. Also new bump on her R under arm.    HPI Patient was seen today for f/u on rash.  Patient initially developed multiple insect bites for which she was seen on 09/21/2021.  Patient seen again 10/07/2021 for rash concerning for folliculitis in left axilla.  Today patient states rash under L armpit still present.  Seems like now starting in R armpit.  Pt states feels like skin has started to crack.  Pt changes deodorants, now using arm and hammer.  Pt denies changes in diet.  Pt's father has h/o psoriasis.  Past Medical History:  Diagnosis Date   Abnormal uterine bleeding    Acid reflux    Anemia    Asthma    Fatty liver    High blood pressure    High cholesterol    Ingrown left big toenail 09/2016   recurrent   PCOS (polycystic ovarian syndrome)    Pre-diabetes    Seasonal allergies     Allergies  Allergen Reactions   Clindamycin/Lincomycin Anaphylaxis    Trouble breathing?    ROS General: Denies fever, chills, night sweats, changes in weight, changes in appetite HEENT: Denies headaches, ear pain, changes in vision, rhinorrhea, sore throat CV: Denies CP, palpitations, SOB, orthopnea Pulm: Denies SOB, cough, wheezing GI: Denies abdominal pain, nausea, vomiting, diarrhea, constipation GU: Denies dysuria, hematuria, frequency, vaginal discharge Msk: Denies muscle cramps, joint pains Neuro: Denies weakness, numbness, tingling Skin: Denies rashes, bruising  +rash under armpits.   Psych: Denies depression, anxiety, hallucinations  Objective:    Blood pressure 124/86, pulse (!) 103, temperature 98.4 F (36.9 C), temperature source Oral, weight (!) 315 lb 3.2 oz (143 kg),  last menstrual period 09/27/2021, SpO2 98 %.  Gen. Pleasant, well-nourished, in no distress, normal affect   HEENT: [place]/AT, face symmetric, conjunctiva clear, no scleral icterus, PERRLA, EOMI, nares patent without drainage Lungs: no accessory muscle use Cardiovascular: RRR,  no peripheral edema Neuro:  A&Ox3, CN II-XII intact, normal gait Skin:  Warm, dry, intact.  B/l axilla L>R with mildly erythematous, edematous, raised, skin with a thickened layer of whitish scale.  Scattered 4-5 mm slightly edematous flat lesions appear to be drying.  No new pustules.   Wt Readings from Last 3 Encounters:  10/15/21 (!) 315 lb 3.2 oz (143 kg)  10/14/21 (!) 312 lb (141.5 kg)  10/07/21 (!) 316 lb 6.4 oz (143.5 kg)    Lab Results  Component Value Date   WBC 10.4 05/12/2021   HGB 13.3 05/12/2021   HCT 41.9 09/30/2021   PLT 438 (H) 05/12/2021   GLUCOSE 86 09/30/2021   CHOL 209 (H) 09/30/2021   TRIG 225 (H) 09/30/2021   HDL 38 (L) 09/30/2021   LDLCALC 131 (H) 09/30/2021   ALT 52 (H) 09/30/2021   AST 29 09/30/2021   NA 138 09/30/2021   K 4.3 09/30/2021   CL 99 09/30/2021   CREATININE 0.74 09/30/2021   BUN 7 09/30/2021   CO2 20 09/30/2021   TSH 2.500 09/30/2021   HGBA1C 5.5 09/30/2021    Assessment/Plan:  Dermatitis - Plan: Ambulatory referral to Dermatology, clobetasol  cream (TEMOVATE) 0.05 %  Discussed areas in bilateral axilla possibly psoriasis is now having a thick appearing layer of whitish scale covering them.  Patient's father has a history of psoriasis.  Discussed trying clobetasol and referral to dermatology.  Continue p.o. antihistamine as needed for pruritus.  Patient encouraged to keep a food diary to see if there is a correlation.  Given precautions.  Given handouts.  F/u as needed  Grier Mitts, MD

## 2021-10-19 NOTE — Progress Notes (Unsigned)
Chief Complaint:   OBESITY Terri Cook is here to discuss her progress with her obesity treatment plan along with follow-up of her obesity related diagnoses. Terri Cook is on the Category 4 Plan and states she is following her eating plan approximately 80% of the time. Terri Cook states she is riding the exercise bike for 30 minutes 2-3 times per week.  Today's visit was #: 2 Starting weight: 314 lbs Starting date: 09/30/2021 Today's weight: 312 lbs Today's date: 10/14/2021 Total lbs lost to date: 2 Total lbs lost since last in-office visit: 2  Interim History: Terri Cook is down 2 lbs since first visit. She has not been feeling well.   Subjective:   1. Elevated ALT measurement Terri Cook is taking Ibuprofen, and she is not taking Tylenol. She denies ETOH and notes fatigue.   2. Iron deficiency Terri Cook is taking iron daily.   3. Vitamin D deficiency Terri Cook is not on Vitamin D, and her Vitamin D level is 9.7.  4. Insulin resistance Terri Cook's insulin resistance is 24.8 and A1c 5.5.   5. Hypertriglyceridemia Terri Cook's triglycerides is 225.  Assessment/Plan:   1. Elevated ALT measurement Terri Cook will work on the meal plan and exercise.  2. Iron deficiency Terri Cook will follow-up with the hematologist on July 20 for iron deficiency.  3. Vitamin D deficiency Terri Cook agreed to start prescription vitamin D 50,000 units once weekly, with no refills.  - Vitamin D, Ergocalciferol, (DRISDOL) 1.25 MG (50000 UNIT) CAPS capsule; Take 1 capsule (50,000 Units total) by mouth every 7 (seven) days.  Dispense: 5 capsule; Refill: 0  4. Insulin resistance Handouts on insulin resistance and prediabetes were given to the patient today.  5. Hypertriglyceridemia Terri Cook will work on diet and exercise to help lower her triglycerides.  6. Obesity, Current BMI 47.4 Terri Cook is currently in the action stage of change. As such, her goal is to continue with weight loss efforts. She has agreed to the Category 4 Plan.   Meal planning and  intentional eating were discussed. Reviewed labs with the patient from 09/30/2021, CMP, lipids, A1c, insulin, Vitamin D, and thyroid panel. Protein equivalents.   Exercise goals: As is.   Behavioral modification strategies: increasing lean protein intake, decreasing simple carbohydrates, increasing vegetables, increasing water intake, decreasing eating out, no skipping meals, meal planning and cooking strategies, keeping healthy foods in the home, and planning for success.  [place] has agreed to follow-up with our clinic in 2 to 3 weeks. She was informed of the importance of frequent follow-up visits to maximize her success with intensive lifestyle modifications for her multiple health conditions.   Objective:   Blood pressure 109/78, pulse 100, temperature 98 F (36.7 C), height '5\' 8"'$  (1.727 m), weight (!) 312 lb (141.5 kg), last menstrual period 09/27/2021, SpO2 97 %. Body mass index is 47.44 kg/m.  General: Cooperative, alert, well developed, in no acute distress. HEENT: Conjunctivae and lids unremarkable. Cardiovascular: Regular rhythm.  Lungs: Normal work of breathing. Neurologic: No focal deficits.   Lab Results  Component Value Date   CREATININE 0.74 09/30/2021   BUN 7 09/30/2021   NA 138 09/30/2021   K 4.3 09/30/2021   CL 99 09/30/2021   CO2 20 09/30/2021   Lab Results  Component Value Date   ALT 52 (H) 09/30/2021   AST 29 09/30/2021   ALKPHOS 89 09/30/2021   BILITOT 1.0 09/30/2021   Lab Results  Component Value Date   HGBA1C 5.5 09/30/2021   HGBA1C 5.8 01/09/2021   HGBA1C 5.7  08/14/2020   HGBA1C 5.4 01/17/2020   HGBA1C 5.0 04/04/2018   Lab Results  Component Value Date   INSULIN 24.8 09/30/2021   Lab Results  Component Value Date   TSH 2.500 09/30/2021   Lab Results  Component Value Date   CHOL 209 (H) 09/30/2021   HDL 38 (L) 09/30/2021   LDLCALC 131 (H) 09/30/2021   TRIG 225 (H) 09/30/2021   CHOLHDL 4 01/09/2021   Lab Results  Component Value Date    VD25OH 9.7 (L) 09/30/2021   VD25OH 18.40 (L) 01/09/2021   VD25OH 31 08/23/2016   Lab Results  Component Value Date   WBC 10.4 05/12/2021   HGB 13.3 05/12/2021   HCT 41.9 09/30/2021   MCV 75.8 (L) 05/12/2021   PLT 438 (H) 05/12/2021   Lab Results  Component Value Date   IRON 41 09/30/2021   TIBC 498 (H) 09/30/2021   FERRITIN 14 (L) 09/30/2021   Attestation Statements:   Reviewed by clinician on day of visit: allergies, medications, problem list, medical history, surgical history, family history, social history, and previous encounter notes.   Wilhemena Durie, am acting as Location manager for [employer], DO.  I have reviewed the above documentation for accuracy and completeness, and I agree with the above. Jearld Lesch, DO

## 2021-10-20 ENCOUNTER — Encounter (INDEPENDENT_AMBULATORY_CARE_PROVIDER_SITE_OTHER): Payer: Self-pay | Admitting: Bariatrics

## 2021-10-20 NOTE — Progress Notes (Signed)
[place] [phone]   [phone]  CONSULT NOTE  REFERRING PHYSICIAN: Dr. Owens Shark  REASON FOR CONSULTATION:  Iron deficiency anemia  HPI Terri Cook is a 21 y.o. female with past medical history significant for obesity, PCOS, menorrhagia, HSV-2, GERD, and allergies is referred to the clinic for iron deficiency anemia.   The patient follows with the healthy weight and wellness program.  She had a follow-up appointment on 09/30/2021. She had repeat lab work performed that day for her history of iron deficiency anemia and it showed persistent low iron saturation 8%, elevated TIBC of 498, borderline low iron at 41, and low ferritin at 14.  Her folate was within normal limits as well as her B12.  She has been taking an over-the-counter iron supplement for 1 to 2 months.  She is unsure of the milligram dose of her iron. She reports she takes this daily.  She denies any GI upset.  Given the persistently low iron despite taking an over-the-counter iron supplement, she is referred to the clinic for further evaluation and recommendations regarding this.  To the patient's knowledge, she reports she was first told she was iron deficient in February 2023 (although she did not realize this until April 2023). She had blood work performed because she was feeling tired. She had ferritin drawn in February 2023 which was low at 5.  Again, she did not have any evidence of anemia at that time and her hemoglobin was 13.3.  She does have microcytosis on labs.    The oldest records available to me are from 2017.  I do not see that she has had any anemia only iron deficiency.  She has never required an iron infusion or blood transfusion before.  Overall, the patient is feeling fairly well today.  She denies any abnormal bleeding or bruising including epistaxis, gingival bleeding, hemoptysis, hematemesis, melena, or hematochezia.  Denies easy bruising.  She says she has irregular  menstrual cycles.  She follows with Dr. Sabra Heck from OB/GYN.  She has a menstrual cycle approximately once every 4 weeks but reports light spotting approximately 2 weeks after her menstrual cycle ends.  She reports her cycles are heavy for the first few days with passage of some clots.  She is unable to estimate how many pads she goes through today.  She denies any significant fatigue today.  Denies any lightheadedness or cold intolerance.  She reports that people often tell her she is pale.  The patient reports craving ice chips.  She denies any kidney disease.  Denies any bariatric surgery.  Denies any unexplained weight loss.  She is actively trying to lose weight.  She denies any particular dietary habits such as being a vegan or vegetarian and has a well-rounded diet.  She denies frequent NSAID use.  She may use Midol for cramps during her cycle.  The patient mentions that she is waiting to see dermatology for an ongoing concern with erythematous plaques in her armpits.  She has been evaluated for possible yeast was prescribed Clobetasone cream, hydrocortisone cream, nystatin, antibiotics, etc.  She has been seeing her primary care provider regarding this concern.  The patient is wanting a biopsy.  She reports the earliest that dermatology is able to see her is in October 2023.  She is considering going to the emergency room in a few days for a biopsy.  The patient denies any family history of anemia or blood disorders.  The patient's father  has diabetes.  The patient's mother has asthma.  The patient believes she has hyperlipidemia in her family.  Unsure if she has any family history of any malignancies.  Patient is currently working in the [employer].  She is single and does not have any children.  She has a brief smoking history which she quit.  She vapes.  Denies any alcohol use but reports she previously would "sip" alcohol.  Denies any history of any drug use.  HPI  Past Medical History:   Diagnosis Date   Abnormal uterine bleeding    Acid reflux    Anemia    Asthma    Fatty liver    High blood pressure    High cholesterol    Ingrown left big toenail 09/2016   recurrent   PCOS (polycystic ovarian syndrome)    Pre-diabetes    Seasonal allergies     Past Surgical History:  Procedure Laterality Date   GALLBLADDER SURGERY  2021   LESION EXCISION Left 07/11/2014   Procedure: EXCISION OF BENIGN LESIONS INCLUDING MARGINS ;  Surgeon: Gerald Stabs, MD;  Location: [place];  Service: Pediatrics;  Laterality: Left;  mid back  and right neck   TOENAIL EXCISION  01/27/2012   Procedure: MINOR TOENAIL EXCISION;  Surgeon: Jerilynn Mages. Gerald Stabs, MD;  Location: [place];  Service: Pediatrics;  Laterality: Left;   TOENAIL EXCISION  03/23/2012   Procedure: MINOR TOENAIL EXCISION;  Surgeon: Jerilynn Mages. Gerald Stabs, MD;  Location: [place];  Service: Pediatrics;  Laterality: Right;   TOENAIL EXCISION Right 09/21/2012   Procedure: Partial Excision of Right Toenail;  Surgeon: Jerilynn Mages. Gerald Stabs, MD;  Location: [place];  Service: Pediatrics;  Laterality: Right;   TOENAIL EXCISION Left 02/15/2013   Procedure: PARTIAL TOENAIL EXCISION ON THE LEFT BIG TOE AT THE OUTER LATERAL FOLD WITH PHENOL (MP ROOM) ;  Surgeon: Jerilynn Mages. Gerald Stabs, MD;  Location: [place];  Service: Pediatrics;  Laterality: Left;   TOENAIL EXCISION Left 08/16/2013   Procedure: PARTIAL EXCISION OF LEFT BIG TOE NAIL FROM OUTER LATERAL FOLD;  Surgeon: Jerilynn Mages. Gerald Stabs, MD;  Location: [place];  Service: Pediatrics;  Laterality: Left;   TOENAIL EXCISION Left 09/16/2016   Procedure: PARTIAL PERMANENT EXCISION OF LEFT INGROWN BIG TOENAIL;  Surgeon: Gerald Stabs, MD;  Location: [place];  Service: General;  Laterality: Left;   WISDOM TOOTH EXTRACTION  12/08/2018   all 4    Family History  Problem Relation Age of  Onset   Asthma Mother    Obesity Mother    Diabetes Father    Hypertension Father    Asthma Father    High Cholesterol Father    Obesity Father    Cervical cancer Maternal Grandmother    Cirrhosis Paternal Grandmother        caused by medication   Diabetes Paternal Grandmother     Social History Social History   Tobacco Use   Smoking status: Every Day    Types: Cigarettes   Smokeless tobacco: Never   Tobacco comments:    1-2 a day  Vaping Use   Vaping Use: Never used  Substance Use Topics   Alcohol use: No   Drug use: No    Allergies  Allergen Reactions   Clindamycin/Lincomycin Anaphylaxis    Trouble breathing?    Current Outpatient Medications  Medication Sig Dispense Refill   Acetaminophen-Caff-Pyrilamine (MIDOL MAX ST MENSTRUAL PO)  Take 2 tablets by mouth 2 (two) times daily as needed (cramps).     ADVAIR HFA 230-21 MCG/ACT inhaler SMARTSIG:2 Puff(s) By Mouth Twice Daily     Ascorbic Acid (VITAMIN C PO) Take 1,000 mg by mouth daily.     aspirin-acetaminophen-caffeine (EXCEDRIN EXTRA STRENGTH) 250-250-65 MG tablet Take 2 tablets by mouth every 8 (eight) hours as needed for headache or migraine.     BALZIVA 0.4-35 MG-MCG tablet Take 1 tablet by mouth daily. 84 tablet 4   cetirizine (ZYRTEC) 10 MG tablet Take 10 mg by mouth daily as needed for allergies.     cholecalciferol (VITAMIN D3) 25 MCG (1000 UNIT) tablet Take 1,000 Units by mouth daily.     clobetasol cream (TEMOVATE) 1.01 % Apply 1 Application topically 2 (two) times daily. Apply to affected areas of skin. 45 g 0   cyclobenzaprine (FLEXERIL) 5 MG tablet Take 1 tablet (5 mg total) by mouth at bedtime as needed for muscle spasms. 20 tablet 0   FeFum-FePoly-FA-B Cmp-C-Biot (INTEGRA PLUS) CAPS Take 1 capsule by mouth every morning. 30 capsule 2   fluconazole (DIFLUCAN) 150 MG tablet Take 1 tab now.  Repeat dose in 3 days. 2 tablet 0   ibuprofen (ADVIL,MOTRIN) 200 MG tablet Take 400 mg by mouth every 6 (six) hours  as needed for mild pain or headache.     meloxicam (MOBIC) 7.5 MG tablet Take 1 tablet (7.5 mg total) by mouth daily. 30 tablet 0   omeprazole (PRILOSEC) 40 MG capsule Take 1 capsule by mouth twice daily (Patient taking differently: 40 mg daily.) 60 capsule 3   valACYclovir (VALTREX) 500 MG tablet Take one tablet po BID x 3 days with an outbreak 30 tablet 2   Vitamin D, Ergocalciferol, (DRISDOL) 1.25 MG (50000 UNIT) CAPS capsule Take 1 capsule (50,000 Units total) by mouth every 7 (seven) days. 5 capsule 0   zinc gluconate 50 MG tablet Take 50 mg by mouth daily.     No current facility-administered medications for this visit.    REVIEW OF SYSTEMS:   Review of Systems  Constitutional: Negative for appetite change, chills, fatigue, fever and unexpected weight change.  HENT:   Negative for mouth sores, nosebleeds, sore throat and trouble swallowing.   Eyes: Negative for eye problems and icterus.  Respiratory: Negative for cough, hemoptysis, shortness of breath and wheezing.   Cardiovascular: Negative for chest pain and leg swelling.  Gastrointestinal: Negative for abdominal pain, constipation, diarrhea, nausea and vomiting.  Genitourinary: Negative for bladder incontinence, difficulty urinating, dysuria, frequency and hematuria.   Musculoskeletal: Negative for back pain, gait problem, neck pain and neck stiffness.  Skin: Positive for itching and rash in her armpits. Neurological: Negative for dizziness, extremity weakness, gait problem, headaches, light-headedness and seizures.  Hematological: Negative for adenopathy. Does not bruise/bleed easily.  Psychiatric/Behavioral: Negative for confusion, depression and sleep disturbance. The patient is not nervous/anxious.     PHYSICAL EXAMINATION:  Blood pressure 114/75, pulse 89, temperature 98.2 F (36.8 C), temperature source Oral, resp. rate 19, height '5\' 8"'$  (1.727 m), weight (!) 319 lb 6.4 oz (144.9 kg), last menstrual period 09/27/2021, SpO2  98 %.  ECOG PERFORMANCE STATUS: 0  Physical Exam  Constitutional: Oriented to person, place, and time and well-developed, well-nourished, and in no distress.   HENT:  Head: Normocephalic and atraumatic.  Mouth/Throat: Oropharynx is clear and moist. No oropharyngeal exudate.  Eyes: Conjunctivae are normal. Right eye exhibits no discharge. Left eye exhibits no discharge. No scleral  icterus.  Neck: Normal range of motion. Neck supple.  Cardiovascular: Normal rate, regular rhythm, normal heart sounds and intact distal pulses.   Pulmonary/Chest: Effort normal and breath sounds normal. No respiratory distress. No wheezes. No rales.  Abdominal: Soft. Bowel sounds are normal. Exhibits no distension and no mass. There is no tenderness.  Musculoskeletal: Normal range of motion. Exhibits no edema.  Lymphadenopathy:    No cervical adenopathy.  Neurological: Alert and oriented to person, place, and time. Exhibits normal muscle tone. Gait normal. Coordination normal.  Skin: Skin is warm and dry.  Erythematous plaques in her armpits bilaterally.  Not diaphoretic. No erythema.  Positive for pallor. Psychiatric: Mood, memory and judgment normal.  Vitals reviewed.  LABORATORY DATA: Lab Results  Component Value Date   WBC 9.9 10/22/2021   HGB 14.4 10/22/2021   HCT 43.0 10/22/2021   MCV 75.0 (L) 10/22/2021   PLT 471 (H) 10/22/2021      Chemistry      Component Value Date/Time   NA 139 10/22/2021 1248   NA 138 09/30/2021 1150   K 3.9 10/22/2021 1248   CL 104 10/22/2021 1248   CO2 29 10/22/2021 1248   BUN 6 10/22/2021 1248   BUN 7 09/30/2021 1150   CREATININE 0.67 10/22/2021 1248   CREATININE 0.79 09/27/2017 0000      Component Value Date/Time   CALCIUM 9.4 10/22/2021 1248   ALKPHOS 76 10/22/2021 1248   AST 19 10/22/2021 1248   ALT 36 10/22/2021 1248   BILITOT 0.7 10/22/2021 1248       RADIOGRAPHIC STUDIES: No results found.  ASSESSMENT: This is a very pleasant 21 year old female  referred to clinic for iron deficiency without anemia  The patient had several lab studies performed today including a repeat CBC, CMP, iron studies, and ferritin.  A folate and B12 was checked at the healthy weight and wellness center which was within normal limits and was therefore not rechecked today.  Patient's labs from today demonstrate: Microcytosis with normal hemoglobin at 14.4.  CMP is unremarkable.  Her iron studies show borderline low iron at 34, elevated TIBC of 546, low saturation 6%, and UIBC elevated at 512.  Ferritin is pending at this time.  The patient was seen with Dr. Julien Nordmann today.  Dr. Julien Nordmann discussed the options with the patient.  We could consider prescription iron with Integra +1 capsule p.o. daily versus iron infusion to boost her iron stores up quicker.  Dr. Julien Nordmann does not anticipate that IV iron infusions will be something she requires on a regular basis.  The patient is concerned with the cost of both options.  She would like to talk it over with her parents.  For now I have sent a prescription for Integra plus to her pharmacy.  If covered by insurance and affordable, she may pick this up and start taking this.  She decides to move forward with IV iron, she was given a number and advised to call the clinic back.  We would then be happy to arrange for Venofer infusions following week.  She was encouraged to take her iron supplement with vitamin C which helps with iron absorption.  Is also given a handout of iron rich food.  Encouraged to follow-up with her OB/GYN regarding her menorrhagia.  Regarding her rash, the patient has been evaluated numerous times by her primary care provider.  She is waiting for an appointment for dermatology.  I discussed it may be best for the patient to reach  out to the dermatology office to see if she can be evaluated sooner and to be put on a wait list for biopsy.   We will see the patient back for follow-up visit in 3 months for  evaluation and repeat CBC, iron studies, and ferritin.  The patient voices understanding of current disease status and treatment options and is in agreement with the current care plan.  All questions were answered. The patient knows to call the clinic with any problems, questions or concerns. We can certainly see the patient much sooner if necessary.  Thank you so much for allowing me to participate in the care of Terri Cook. I will continue to follow up the patient with you and assist in her care.   Disclaimer: This note was dictated with voice recognition software. Similar sounding words can inadvertently be transcribed and may not be corrected upon review.   Lorann Tani L Jaycie Kregel October 22, 2021, 2:14 PM  ADDENDUM: Hematology/Oncology Attending: I had a face-to-face encounter with the patient today.  I reviewed her records, lab and recommended her care plan.  This is a very pleasant 21 years old white female with history of GERD, menorrhagia, PCOS as well as moderate obesity.  The patient was seen by her primary care physician recently for weight and wellness program and during her evaluation she had blood work that showed low normal serum iron of 41 with iron saturation of 8% and ferritin level of 14.  The patient did not have anemia.  She was referred to [place] today for evaluation and recommendation regarding her condition.  She has been taking over-the-counter oral iron tablets but no improvement in her iron parameters. Her menstrual periods are heavy at times. Repeat CBC today showed hemoglobin of 14.4 and hematocrit 43.0% with MCV of 75.0.  The patient had elevated platelet count of 471,000.  Iron study today showed serum iron of 34 with iron saturation of 6% and TIBC of 546.  Ferritin level was low at 6. I gave the patient the option of treatment with oral Integra +1 capsule p.o. daily versus consideration of IV iron infusion with Feraheme 300 Mg IV weekly for 3 weeks. The patient  would like to talk to her parents about these 2 options before making a decision. I will see her back for follow-up visit in 3 months for evaluation and repeat CBC, iron study and ferritin. The patient was advised to call immediately if she has any other concerning symptoms in the interval. The total time spent in the appointment was 60 minutes. Disclaimer: This note was dictated with voice recognition software. Similar sounding words can inadvertently be transcribed and may be missed upon review. Eilleen Kempf, MD

## 2021-10-21 ENCOUNTER — Telehealth: Payer: Self-pay | Admitting: Family Medicine

## 2021-10-21 NOTE — Telephone Encounter (Signed)
Pt called provider patient was referred to for referral [phone]. They stated they can't get her in until October. Pt requesting a referral to a provider that can get her in sooner.

## 2021-10-22 ENCOUNTER — Other Ambulatory Visit: Payer: Self-pay | Admitting: Physician Assistant

## 2021-10-22 ENCOUNTER — Inpatient Hospital Stay: Payer: Managed Care, Other (non HMO) | Attending: Physician Assistant | Admitting: Physician Assistant

## 2021-10-22 ENCOUNTER — Inpatient Hospital Stay: Payer: Managed Care, Other (non HMO)

## 2021-10-22 VITALS — BP 114/75 | HR 89 | Temp 98.2°F | Resp 19 | Ht 68.0 in | Wt 319.4 lb

## 2021-10-22 DIAGNOSIS — D509 Iron deficiency anemia, unspecified: Secondary | ICD-10-CM

## 2021-10-22 DIAGNOSIS — Z8049 Family history of malignant neoplasm of other genital organs: Secondary | ICD-10-CM | POA: Diagnosis not present

## 2021-10-22 DIAGNOSIS — E611 Iron deficiency: Secondary | ICD-10-CM

## 2021-10-22 DIAGNOSIS — F17210 Nicotine dependence, cigarettes, uncomplicated: Secondary | ICD-10-CM | POA: Diagnosis not present

## 2021-10-22 DIAGNOSIS — Z8249 Family history of ischemic heart disease and other diseases of the circulatory system: Secondary | ICD-10-CM | POA: Insufficient documentation

## 2021-10-22 DIAGNOSIS — Z8349 Family history of other endocrine, nutritional and metabolic diseases: Secondary | ICD-10-CM | POA: Insufficient documentation

## 2021-10-22 DIAGNOSIS — Z79899 Other long term (current) drug therapy: Secondary | ICD-10-CM | POA: Diagnosis not present

## 2021-10-22 DIAGNOSIS — Z8379 Family history of other diseases of the digestive system: Secondary | ICD-10-CM | POA: Diagnosis not present

## 2021-10-22 DIAGNOSIS — E282 Polycystic ovarian syndrome: Secondary | ICD-10-CM | POA: Insufficient documentation

## 2021-10-22 DIAGNOSIS — Z808 Family history of malignant neoplasm of other organs or systems: Secondary | ICD-10-CM

## 2021-10-22 DIAGNOSIS — N920 Excessive and frequent menstruation with regular cycle: Secondary | ICD-10-CM | POA: Diagnosis not present

## 2021-10-22 DIAGNOSIS — Z881 Allergy status to other antibiotic agents status: Secondary | ICD-10-CM | POA: Insufficient documentation

## 2021-10-22 DIAGNOSIS — K219 Gastro-esophageal reflux disease without esophagitis: Secondary | ICD-10-CM | POA: Diagnosis not present

## 2021-10-22 DIAGNOSIS — Z833 Family history of diabetes mellitus: Secondary | ICD-10-CM | POA: Insufficient documentation

## 2021-10-22 DIAGNOSIS — R718 Other abnormality of red blood cells: Secondary | ICD-10-CM | POA: Diagnosis not present

## 2021-10-22 DIAGNOSIS — E669 Obesity, unspecified: Secondary | ICD-10-CM | POA: Diagnosis not present

## 2021-10-22 DIAGNOSIS — Z836 Family history of other diseases of the respiratory system: Secondary | ICD-10-CM | POA: Diagnosis not present

## 2021-10-22 DIAGNOSIS — R5383 Other fatigue: Secondary | ICD-10-CM | POA: Insufficient documentation

## 2021-10-22 LAB — CBC WITH DIFFERENTIAL (CANCER CENTER ONLY)
Abs Immature Granulocytes: 0.04 10*3/uL (ref 0.00–0.07)
Basophils Absolute: 0.1 10*3/uL (ref 0.0–0.1)
Basophils Relative: 1 %
Eosinophils Absolute: 0.3 10*3/uL (ref 0.0–0.5)
Eosinophils Relative: 3 %
HCT: 43 % (ref 36.0–46.0)
Hemoglobin: 14.4 g/dL (ref 12.0–15.0)
Immature Granulocytes: 0 %
Lymphocytes Relative: 21 %
Lymphs Abs: 2.1 10*3/uL (ref 0.7–4.0)
MCH: 25.1 pg — ABNORMAL LOW (ref 26.0–34.0)
MCHC: 33.5 g/dL (ref 30.0–36.0)
MCV: 75 fL — ABNORMAL LOW (ref 80.0–100.0)
Monocytes Absolute: 0.7 10*3/uL (ref 0.1–1.0)
Monocytes Relative: 7 %
Neutro Abs: 6.7 10*3/uL (ref 1.7–7.7)
Neutrophils Relative %: 68 %
Platelet Count: 471 10*3/uL — ABNORMAL HIGH (ref 150–400)
RBC: 5.73 MIL/uL — ABNORMAL HIGH (ref 3.87–5.11)
RDW: 13.2 % (ref 11.5–15.5)
WBC Count: 9.9 10*3/uL (ref 4.0–10.5)
nRBC: 0 % (ref 0.0–0.2)

## 2021-10-22 LAB — CMP (CANCER CENTER ONLY)
ALT: 36 U/L (ref 0–44)
AST: 19 U/L (ref 15–41)
Albumin: 4.2 g/dL (ref 3.5–5.0)
Alkaline Phosphatase: 76 U/L (ref 38–126)
Anion gap: 6 (ref 5–15)
BUN: 6 mg/dL (ref 6–20)
CO2: 29 mmol/L (ref 22–32)
Calcium: 9.4 mg/dL (ref 8.9–10.3)
Chloride: 104 mmol/L (ref 98–111)
Creatinine: 0.67 mg/dL (ref 0.44–1.00)
GFR, Estimated: >60 mL/min (ref >60)
Glucose, Bld: 104 mg/dL — ABNORMAL HIGH (ref 70–99)
Potassium: 3.9 mmol/L (ref 3.5–5.1)
Sodium: 139 mmol/L (ref 135–145)
Total Bilirubin: 0.7 mg/dL (ref 0.3–1.2)
Total Protein: 7.2 g/dL (ref 6.5–8.1)

## 2021-10-22 LAB — IRON AND IRON BINDING CAPACITY (CC-WL,HP ONLY)
Iron: 34 ug/dL (ref 28–170)
Saturation Ratios: 6 % — ABNORMAL LOW (ref 10.4–31.8)
TIBC: 546 ug/dL — ABNORMAL HIGH (ref 250–450)
UIBC: 512 ug/dL — ABNORMAL HIGH (ref 148–442)

## 2021-10-22 LAB — FERRITIN: Ferritin: 6 ng/mL — ABNORMAL LOW (ref 11–307)

## 2021-10-22 MED ORDER — INTEGRA PLUS PO CAPS: 1.0000 | ORAL_CAPSULE | Freq: Every morning | ORAL | 2 refills | Status: AC

## 2021-10-23 ENCOUNTER — Other Ambulatory Visit: Payer: Self-pay | Admitting: Physician Assistant

## 2021-10-23 ENCOUNTER — Telehealth: Payer: Self-pay | Admitting: Physician Assistant

## 2021-10-23 DIAGNOSIS — D509 Iron deficiency anemia, unspecified: Secondary | ICD-10-CM

## 2021-10-23 NOTE — Progress Notes (Signed)
Called the patient because the Integra plus is on backorder.  The patient would like to move forward with IV iron infusions.  I will place the order and send a message to scheduling.  For now, she can continue taking the over-the-counter iron supplement that she is taking until her pharmacy gets Integra plus back in stock.

## 2021-10-23 NOTE — Telephone Encounter (Signed)
Scheduled per 07/21 scheduled message, patient has been called and notified of all upcoming appointments.

## 2021-10-26 ENCOUNTER — Other Ambulatory Visit: Payer: Self-pay | Admitting: Family Medicine

## 2021-10-26 DIAGNOSIS — L309 Dermatitis, unspecified: Secondary | ICD-10-CM

## 2021-10-28 ENCOUNTER — Telehealth: Payer: Self-pay | Admitting: Family Medicine

## 2021-10-28 ENCOUNTER — Ambulatory Visit (INDEPENDENT_AMBULATORY_CARE_PROVIDER_SITE_OTHER): Payer: Managed Care, Other (non HMO) | Admitting: Bariatrics

## 2021-10-28 ENCOUNTER — Encounter (INDEPENDENT_AMBULATORY_CARE_PROVIDER_SITE_OTHER): Payer: Self-pay | Admitting: Bariatrics

## 2021-10-28 VITALS — BP 99/69 | HR 81 | Temp 98.2°F | Ht 68.0 in | Wt 313.0 lb

## 2021-10-28 DIAGNOSIS — E559 Vitamin D deficiency, unspecified: Secondary | ICD-10-CM | POA: Diagnosis not present

## 2021-10-28 DIAGNOSIS — Z6841 Body Mass Index (BMI) 40.0 and over, adult: Secondary | ICD-10-CM

## 2021-10-28 DIAGNOSIS — R7401 Elevation of levels of liver transaminase levels: Secondary | ICD-10-CM | POA: Diagnosis not present

## 2021-10-28 DIAGNOSIS — E669 Obesity, unspecified: Secondary | ICD-10-CM

## 2021-10-28 DIAGNOSIS — L309 Dermatitis, unspecified: Secondary | ICD-10-CM

## 2021-10-28 DIAGNOSIS — E66813 Obesity, class 3: Secondary | ICD-10-CM

## 2021-10-28 MED ORDER — CLOBETASOL PROPIONATE 0.05 % EX CREA: 1.0000 | TOPICAL_CREAM | Freq: Two times a day (BID) | CUTANEOUS | 0 refills | Status: DC

## 2021-10-28 MED ORDER — VITAMIN D (ERGOCALCIFEROL) 1.25 MG (50000 UNIT) PO CAPS: 50000.0000 [IU] | ORAL_CAPSULE | ORAL | 0 refills | Status: DC

## 2021-10-28 NOTE — Telephone Encounter (Signed)
Pt called to say Pharmacy is giving her a hard time refilling her Rx for  clobetasol cream (TEMOVATE) 0.05 %  Pt is asking if we can call them and resolve the issue?   [place], [place] Phone:  [phone]  Fax:  [phone]      Please advise.

## 2021-10-28 NOTE — Telephone Encounter (Signed)
Rx sent 

## 2021-10-30 ENCOUNTER — Inpatient Hospital Stay: Payer: Managed Care, Other (non HMO)

## 2021-10-30 ENCOUNTER — Other Ambulatory Visit: Payer: Self-pay

## 2021-10-30 VITALS — BP 124/77 | HR 82 | Temp 98.3°F | Resp 18

## 2021-10-30 DIAGNOSIS — D509 Iron deficiency anemia, unspecified: Secondary | ICD-10-CM

## 2021-10-30 MED ORDER — SODIUM CHLORIDE 0.9 % IV SOLN: Freq: Once | INTRAVENOUS | Status: AC

## 2021-10-30 MED ORDER — DIPHENHYDRAMINE HCL 25 MG PO CAPS
50.0000 mg | ORAL_CAPSULE | Freq: Once | ORAL | Status: AC
  Administered 2021-10-30: 50 mg via ORAL
  Filled 2021-10-30: qty 2

## 2021-10-30 MED ORDER — SODIUM CHLORIDE 0.9 % IV SOLN
300.0000 mg | Freq: Once | INTRAVENOUS | Status: AC
  Administered 2021-10-30: 300 mg via INTRAVENOUS
  Filled 2021-10-30: qty 300

## 2021-10-30 MED ORDER — ACETAMINOPHEN 325 MG PO TABS
650.0000 mg | ORAL_TABLET | Freq: Once | ORAL | Status: AC
  Administered 2021-10-30: 650 mg via ORAL
  Filled 2021-10-30: qty 2

## 2021-10-30 NOTE — Patient Instructions (Signed)

## 2021-11-06 ENCOUNTER — Other Ambulatory Visit: Payer: Self-pay

## 2021-11-06 ENCOUNTER — Inpatient Hospital Stay: Payer: Managed Care, Other (non HMO) | Attending: Physician Assistant

## 2021-11-06 VITALS — BP 109/56 | HR 78 | Temp 98.1°F | Resp 18

## 2021-11-06 DIAGNOSIS — K219 Gastro-esophageal reflux disease without esophagitis: Secondary | ICD-10-CM | POA: Diagnosis not present

## 2021-11-06 DIAGNOSIS — N920 Excessive and frequent menstruation with regular cycle: Secondary | ICD-10-CM | POA: Insufficient documentation

## 2021-11-06 DIAGNOSIS — D500 Iron deficiency anemia secondary to blood loss (chronic): Secondary | ICD-10-CM | POA: Insufficient documentation

## 2021-11-06 DIAGNOSIS — E282 Polycystic ovarian syndrome: Secondary | ICD-10-CM | POA: Diagnosis not present

## 2021-11-06 DIAGNOSIS — E538 Deficiency of other specified B group vitamins: Secondary | ICD-10-CM | POA: Diagnosis not present

## 2021-11-06 DIAGNOSIS — Z79899 Other long term (current) drug therapy: Secondary | ICD-10-CM | POA: Diagnosis not present

## 2021-11-06 DIAGNOSIS — D509 Iron deficiency anemia, unspecified: Secondary | ICD-10-CM

## 2021-11-06 DIAGNOSIS — E669 Obesity, unspecified: Secondary | ICD-10-CM | POA: Diagnosis not present

## 2021-11-06 MED ORDER — ACETAMINOPHEN 325 MG PO TABS
650.0000 mg | ORAL_TABLET | Freq: Once | ORAL | Status: AC
  Administered 2021-11-06: 650 mg via ORAL
  Filled 2021-11-06: qty 2

## 2021-11-06 MED ORDER — SODIUM CHLORIDE 0.9 % IV SOLN: Freq: Once | INTRAVENOUS | Status: AC

## 2021-11-06 MED ORDER — SODIUM CHLORIDE 0.9 % IV SOLN
300.0000 mg | Freq: Once | INTRAVENOUS | Status: AC
  Administered 2021-11-06: 300 mg via INTRAVENOUS
  Filled 2021-11-06: qty 300

## 2021-11-06 MED ORDER — DIPHENHYDRAMINE HCL 25 MG PO CAPS
50.0000 mg | ORAL_CAPSULE | Freq: Once | ORAL | Status: AC
  Administered 2021-11-06: 50 mg via ORAL
  Filled 2021-11-06: qty 2

## 2021-11-06 NOTE — Patient Instructions (Signed)

## 2021-11-09 ENCOUNTER — Encounter (INDEPENDENT_AMBULATORY_CARE_PROVIDER_SITE_OTHER): Payer: Self-pay | Admitting: Bariatrics

## 2021-11-09 NOTE — Progress Notes (Signed)
Chief Complaint:   OBESITY Terri Cook is here to discuss her progress with her obesity treatment plan along with follow-up of her obesity related diagnoses. Terri Cook is on the Category 4 Plan and states she is following her eating plan approximately 40% of the time. Terri Cook states she is riding exercise bike for 10-15 minutes 3-4 times per week.  Today's visit was #: 3 Starting weight: 314 lbs Starting date: 09/30/2021 Today's weight: 313 lbs Today's date: 10/28/2021 Total lbs lost to date: 1 Total lbs lost since last in-office visit: 0  Interim History: Terri Cook is down 1 pound since her last visit.  She is not struggling.  She is getting adequate protein and water.  Subjective:   1. Vitamin D deficiency Terri Cook is taking Vitamin D.  2. Elevated ALT measurement Terri Cook is not on medications currently.  Assessment/Plan:   1. Vitamin D deficiency Terri Cook will continue prescription vitamin D 50,000 units once weekly, and we will refill for 1 month.  - Vitamin D, Ergocalciferol, (DRISDOL) 1.25 MG (50000 UNIT) CAPS capsule; Take 1 capsule (50,000 Units total) by mouth every 7 (seven) days.  Dispense: 5 capsule; Refill: 0  2. Elevated ALT measurement We will follow-up on Terri Cook's ALT over time.   3. Obesity, Current BMI 47.7 Terri Cook is currently in the action stage of change. As such, her goal is to continue with weight loss efforts. She has agreed to the Category 4 Plan.   Meal planning was discussed.  Will adhere closely to the plan 80-90%.  Minimize carbohydrates.  Exercise goals: As is.   Behavioral modification strategies: increasing lean protein intake, decreasing simple carbohydrates, increasing vegetables, increasing water intake, decreasing eating out, no skipping meals, meal planning and cooking strategies, keeping healthy foods in the home, and planning for success.  Terri Cook has agreed to follow-up with our clinic in 2 weeks. She was informed of the importance of frequent follow-up visits to  maximize her success with intensive lifestyle modifications for her multiple health conditions.   Objective:   Blood pressure 99/69, pulse 81, temperature 98.2 F (36.8 C), height '5\' 8"'$  (1.727 m), weight (!) 313 lb (142 kg), last menstrual period 09/27/2021, SpO2 98 %. Body mass index is 47.59 kg/m.  General: Cooperative, alert, well developed, in no acute distress. HEENT: Conjunctivae and lids unremarkable. Cardiovascular: Regular rhythm.  Lungs: Normal work of breathing. Neurologic: No focal deficits.   Lab Results  Component Value Date   CREATININE 0.67 10/22/2021   BUN 6 10/22/2021   NA 139 10/22/2021   K 3.9 10/22/2021   CL 104 10/22/2021   CO2 29 10/22/2021   Lab Results  Component Value Date   ALT 36 10/22/2021   AST 19 10/22/2021   ALKPHOS 76 10/22/2021   BILITOT 0.7 10/22/2021   Lab Results  Component Value Date   HGBA1C 5.5 09/30/2021   HGBA1C 5.8 01/09/2021   HGBA1C 5.7 08/14/2020   HGBA1C 5.4 01/17/2020   HGBA1C 5.0 04/04/2018   Lab Results  Component Value Date   INSULIN 24.8 09/30/2021   Lab Results  Component Value Date   TSH 2.500 09/30/2021   Lab Results  Component Value Date   CHOL 209 (H) 09/30/2021   HDL 38 (L) 09/30/2021   LDLCALC 131 (H) 09/30/2021   TRIG 225 (H) 09/30/2021   CHOLHDL 4 01/09/2021   Lab Results  Component Value Date   VD25OH 9.7 (L) 09/30/2021   VD25OH 18.40 (L) 01/09/2021   VD25OH 31 08/23/2016  Lab Results  Component Value Date   WBC 9.9 10/22/2021   HGB 14.4 10/22/2021   HCT 43.0 10/22/2021   MCV 75.0 (L) 10/22/2021   PLT 471 (H) 10/22/2021   Lab Results  Component Value Date   IRON 34 10/22/2021   TIBC 546 (H) 10/22/2021   FERRITIN 6 (L) 10/22/2021   Attestation Statements:   Reviewed by clinician on day of visit: allergies, medications, problem list, medical history, surgical history, family history, social history, and previous encounter notes.  Wilhemena Durie, am acting as Location manager  for [employer], DO.  I have reviewed the above documentation for accuracy and completeness, and I agree with the above. Jearld Lesch, DO

## 2021-11-11 ENCOUNTER — Encounter (INDEPENDENT_AMBULATORY_CARE_PROVIDER_SITE_OTHER): Payer: Self-pay | Admitting: Bariatrics

## 2021-11-11 ENCOUNTER — Encounter (INDEPENDENT_AMBULATORY_CARE_PROVIDER_SITE_OTHER): Payer: Self-pay

## 2021-11-11 ENCOUNTER — Ambulatory Visit (INDEPENDENT_AMBULATORY_CARE_PROVIDER_SITE_OTHER): Payer: Managed Care, Other (non HMO) | Admitting: Bariatrics

## 2021-11-11 VITALS — BP 100/70 | HR 73 | Temp 98.0°F | Ht 68.0 in | Wt 319.0 lb

## 2021-11-11 DIAGNOSIS — E559 Vitamin D deficiency, unspecified: Secondary | ICD-10-CM

## 2021-11-11 DIAGNOSIS — E669 Obesity, unspecified: Secondary | ICD-10-CM | POA: Diagnosis not present

## 2021-11-11 DIAGNOSIS — Z6841 Body Mass Index (BMI) 40.0 and over, adult: Secondary | ICD-10-CM

## 2021-11-11 DIAGNOSIS — E65 Localized adiposity: Secondary | ICD-10-CM

## 2021-11-11 MED ORDER — VITAMIN D (ERGOCALCIFEROL) 1.25 MG (50000 UNIT) PO CAPS: 50000.0000 [IU] | ORAL_CAPSULE | ORAL | 0 refills | Status: DC

## 2021-11-13 ENCOUNTER — Other Ambulatory Visit: Payer: Self-pay | Admitting: Family Medicine

## 2021-11-13 ENCOUNTER — Inpatient Hospital Stay: Payer: Managed Care, Other (non HMO)

## 2021-11-13 ENCOUNTER — Other Ambulatory Visit: Payer: Self-pay

## 2021-11-13 VITALS — BP 115/65 | HR 76 | Temp 98.6°F | Resp 18

## 2021-11-13 DIAGNOSIS — D500 Iron deficiency anemia secondary to blood loss (chronic): Secondary | ICD-10-CM | POA: Diagnosis not present

## 2021-11-13 DIAGNOSIS — D509 Iron deficiency anemia, unspecified: Secondary | ICD-10-CM

## 2021-11-13 DIAGNOSIS — L309 Dermatitis, unspecified: Secondary | ICD-10-CM

## 2021-11-13 MED ORDER — SODIUM CHLORIDE 0.9 % IV SOLN: Freq: Once | INTRAVENOUS | Status: AC

## 2021-11-13 MED ORDER — DIPHENHYDRAMINE HCL 25 MG PO CAPS
50.0000 mg | ORAL_CAPSULE | Freq: Once | ORAL | Status: AC
  Administered 2021-11-13: 50 mg via ORAL

## 2021-11-13 MED ORDER — ACETAMINOPHEN 325 MG PO TABS
650.0000 mg | ORAL_TABLET | Freq: Once | ORAL | Status: AC
  Administered 2021-11-13: 650 mg via ORAL

## 2021-11-13 MED ORDER — SODIUM CHLORIDE 0.9 % IV SOLN
300.0000 mg | Freq: Once | INTRAVENOUS | Status: AC
  Administered 2021-11-13: 300 mg via INTRAVENOUS
  Filled 2021-11-13: qty 300

## 2021-11-13 NOTE — Patient Instructions (Signed)

## 2021-11-17 NOTE — Progress Notes (Unsigned)
Chief Complaint:   OBESITY Terri Cook is here to discuss her progress with her obesity treatment plan along with follow-up of her obesity related diagnoses. Terri Cook is on the Category 4 Plan and states she is following her eating plan approximately 60% of the time. Terri Cook states she is walking and riding her bike for 30 minutes 2-3 times per week.  Today's visit was #: 4 Starting weight: 314 lbs Starting date: 09/30/2021 Today's weight: 319 lbs Today's date: 11/11/21 Total lbs lost to date: 0 Total lbs lost since last in-office visit: +6  Interim History: She is up 6 pounds since her last visit.  She denies any issues, denies polyphagia.  Subjective:   1. Vitamin D deficiency Taking vitamin D.  2. Visceral obesity Eats at [facility].  Visceral fat rating is 15.  Goal is less than or equal to 13.  Assessment/Plan:   1. Vitamin D deficiency Refill - Vitamin D, Ergocalciferol, (DRISDOL) 1.25 MG (50000 UNIT) CAPS capsule; Take 1 capsule (50,000 Units total) by mouth every 7 (seven) days.  Dispense: 5 capsule; Refill: 0  2. Visceral obesity 1.  We will continue to work on the meal plan and exercise.  3. Obesity, Current BMI 48.6 1.  Will follow the plan 80 to 90% of the time.  Terri Cook is currently in the action stage of change. As such, her goal is to continue with weight loss efforts. She has agreed to the Category 4 Plan.   Exercise goals: Walk and ride the bike will get in more.  Behavioral modification strategies: increasing lean protein intake, decreasing simple carbohydrates, increasing vegetables, increasing water intake, no skipping meals, meal planning and cooking strategies, keeping healthy foods in the home, and ways to avoid boredom eating.  Terri Cook has agreed to follow-up with our clinic in 2-3 weeks. She was informed of the importance of frequent follow-up visits to maximize her success with intensive lifestyle modifications for her multiple health conditions.    Objective:    Blood pressure 100/70, pulse 73, temperature 98 F (36.7 C), height '5\' 8"'$  (1.727 m), weight (!) 319 lb (144.7 kg), SpO2 99 %. Body mass index is 48.5 kg/m.  General: Cooperative, alert, well developed, in no acute distress. HEENT: Conjunctivae and lids unremarkable. Cardiovascular: Regular rhythm.  Lungs: Normal work of breathing. Neurologic: No focal deficits.   Lab Results  Component Value Date   CREATININE 0.67 10/22/2021   BUN 6 10/22/2021   NA 139 10/22/2021   K 3.9 10/22/2021   CL 104 10/22/2021   CO2 29 10/22/2021   Lab Results  Component Value Date   ALT 36 10/22/2021   AST 19 10/22/2021   ALKPHOS 76 10/22/2021   BILITOT 0.7 10/22/2021   Lab Results  Component Value Date   HGBA1C 5.5 09/30/2021   HGBA1C 5.8 01/09/2021   HGBA1C 5.7 08/14/2020   HGBA1C 5.4 01/17/2020   HGBA1C 5.0 04/04/2018   Lab Results  Component Value Date   INSULIN 24.8 09/30/2021   Lab Results  Component Value Date   TSH 2.500 09/30/2021   Lab Results  Component Value Date   CHOL 209 (H) 09/30/2021   HDL 38 (L) 09/30/2021   LDLCALC 131 (H) 09/30/2021   TRIG 225 (H) 09/30/2021   CHOLHDL 4 01/09/2021   Lab Results  Component Value Date   VD25OH 9.7 (L) 09/30/2021   VD25OH 18.40 (L) 01/09/2021   VD25OH 31 08/23/2016   Lab Results  Component Value Date   WBC 9.9  10/22/2021   HGB 14.4 10/22/2021   HCT 43.0 10/22/2021   MCV 75.0 (L) 10/22/2021   PLT 471 (H) 10/22/2021   Lab Results  Component Value Date   IRON 34 10/22/2021   TIBC 546 (H) 10/22/2021   FERRITIN 6 (L) 10/22/2021    Attestation Statements:   Reviewed by clinician on day of visit: allergies, medications, problem list, medical history, surgical history, family history, social history, and previous encounter notes.  I, Dawn Whitmire, FNP-C, am acting as transcriptionist for Dr. Jearld Lesch.  I have reviewed the above documentation for accuracy and completeness, and I agree with the above. Jearld Lesch, DO

## 2021-11-19 ENCOUNTER — Encounter (INDEPENDENT_AMBULATORY_CARE_PROVIDER_SITE_OTHER): Payer: Self-pay | Admitting: Bariatrics

## 2021-11-29 ENCOUNTER — Emergency Department (HOSPITAL_COMMUNITY)
Admission: EM | Admit: 2021-11-29 | Discharge: 2021-11-29 | Discharge status: Absent without leave | Payer: Managed Care, Other (non HMO) | Source: Home / Self Care

## 2021-12-01 ENCOUNTER — Ambulatory Visit: Payer: Managed Care, Other (non HMO) | Admitting: Family Medicine

## 2021-12-02 ENCOUNTER — Encounter: Payer: Self-pay | Admitting: Family Medicine

## 2021-12-02 ENCOUNTER — Ambulatory Visit (INDEPENDENT_AMBULATORY_CARE_PROVIDER_SITE_OTHER): Payer: Managed Care, Other (non HMO) | Admitting: Family Medicine

## 2021-12-02 VITALS — BP 120/70 | HR 105 | Temp 98.1°F | Ht 68.0 in | Wt 329.5 lb

## 2021-12-02 DIAGNOSIS — R109 Unspecified abdominal pain: Secondary | ICD-10-CM | POA: Diagnosis not present

## 2021-12-02 LAB — POC URINALSYSI DIPSTICK (AUTOMATED)
Bilirubin, UA: NEGATIVE
Blood, UA: NEGATIVE
Glucose, UA: NEGATIVE
Ketones, UA: NEGATIVE
Nitrite, UA: NEGATIVE
Protein, UA: NEGATIVE
Spec Grav, UA: 1.015 (ref 1.010–1.025)
Urobilinogen, UA: 0.2 E.U./dL
pH, UA: 7 (ref 5.0–8.0)

## 2021-12-02 NOTE — Addendum Note (Signed)
Addended by: Rosalyn Gess D on: 12/02/2021 03:07 PM   Modules accepted: Orders

## 2021-12-02 NOTE — Progress Notes (Signed)
Established Patient Office Visit  Subjective   Patient ID: Terri Cook, female    DOB: 06-Feb-2001  Age: 21 y.o. MRN: [id number]  Chief Complaint  Patient presents with   Pelvic Pain    Patient complains of pelvic pain, x3 days     HPI   Terri Cook is seen with pain right lower quadrant started Saturday night.  Was worse by Sunday she actually went to the ER on the 27th but left after substantial weight.  She states she had low-grade fever 100.6 Sunday but no documented fever since then.  She had some vomiting on Saturday night and little bit of nausea since then but not consistently.  No urinary symptoms.  No flank pain.  Last menstrual period about 2 weeks ago.  She had previous cholecystectomy.  She apparently had similar presentation back last December and had ultrasound and CT scan then which were unremarkable.  Denies any vaginal discharge.  He does have history of reported PCOS and insulin resistance.  Past Medical History:  Diagnosis Date   Abnormal uterine bleeding    Acid reflux    Anemia    Asthma    Fatty liver    High blood pressure    High cholesterol    Ingrown left big toenail 09/2016   recurrent   PCOS (polycystic ovarian syndrome)    Pre-diabetes    Seasonal allergies    Past Surgical History:  Procedure Laterality Date   GALLBLADDER SURGERY  2021   LESION EXCISION Left 07/11/2014   Procedure: EXCISION OF BENIGN LESIONS INCLUDING MARGINS ;  Surgeon: Gerald Stabs, MD;  Location: [place];  Service: Pediatrics;  Laterality: Left;  mid back  and right neck   TOENAIL EXCISION  01/27/2012   Procedure: MINOR TOENAIL EXCISION;  Surgeon: Jerilynn Mages. Gerald Stabs, MD;  Location: [place];  Service: Pediatrics;  Laterality: Left;   TOENAIL EXCISION  03/23/2012   Procedure: MINOR TOENAIL EXCISION;  Surgeon: Jerilynn Mages. Gerald Stabs, MD;  Location: [place];  Service: Pediatrics;  Laterality: Right;   TOENAIL EXCISION Right  09/21/2012   Procedure: Partial Excision of Right Toenail;  Surgeon: Jerilynn Mages. Gerald Stabs, MD;  Location: [place];  Service: Pediatrics;  Laterality: Right;   TOENAIL EXCISION Left 02/15/2013   Procedure: PARTIAL TOENAIL EXCISION ON THE LEFT BIG TOE AT THE OUTER LATERAL FOLD WITH PHENOL (MP ROOM) ;  Surgeon: Jerilynn Mages. Gerald Stabs, MD;  Location: [place];  Service: Pediatrics;  Laterality: Left;   TOENAIL EXCISION Left 08/16/2013   Procedure: PARTIAL EXCISION OF LEFT BIG TOE NAIL FROM OUTER LATERAL FOLD;  Surgeon: Jerilynn Mages. Gerald Stabs, MD;  Location: [place];  Service: Pediatrics;  Laterality: Left;   TOENAIL EXCISION Left 09/16/2016   Procedure: PARTIAL PERMANENT EXCISION OF LEFT INGROWN BIG TOENAIL;  Surgeon: Gerald Stabs, MD;  Location: [place];  Service: General;  Laterality: Left;   WISDOM TOOTH EXTRACTION  12/08/2018   all 4    reports that she has been smoking cigarettes. She has never used smokeless tobacco. She reports that she does not drink alcohol and does not use drugs. family history includes Asthma in her father and mother; Cervical cancer in her maternal grandmother; Cirrhosis in her paternal grandmother; Diabetes in her father and paternal grandmother; High Cholesterol in her father; Hypertension in her father; Obesity in her father and mother. Allergies  Allergen Reactions   Clindamycin/Lincomycin Anaphylaxis    Trouble  breathing?    Review of Systems  Constitutional:  Negative for weight loss.  Respiratory:  Negative for cough.   Cardiovascular:  Negative for chest pain.  Gastrointestinal:        See HPI  Genitourinary:  Negative for dysuria, flank pain, frequency and hematuria.      Objective:     BP 120/70 (BP Location: Left Arm, Patient Position: Sitting, Cuff Size: Large)   Pulse (!) 105   Temp 98.1 F (36.7 C) (Oral)   Ht '5\' 8"'$  (1.727 m)   Wt (!) 329 lb 8 oz (149.5 kg)   SpO2 100%   BMI  50.10 kg/m    Physical Exam Vitals reviewed.  Constitutional:      Appearance: Normal appearance.  Cardiovascular:     Rate and Rhythm: Normal rate and regular rhythm.  Pulmonary:     Effort: Pulmonary effort is normal.     Breath sounds: Normal breath sounds.  Abdominal:     General: Bowel sounds are normal. There is no distension.     Palpations: Abdomen is soft. There is no mass.     Comments: No significant reproducible tenderness at this time.  No guarding or rebound.  Neurological:     Mental Status: She is alert.      Results for orders placed or performed in visit on 12/02/21  POCT Urinalysis Dipstick (Automated)  Result Value Ref Range   Color, UA yellow    Clarity, UA clear    Glucose, UA Negative Negative   Bilirubin, UA Negative    Ketones, UA Negative    Spec Grav, UA 1.015 1.010 - 1.025   Blood, UA Negative    pH, UA 7.0 5.0 - 8.0   Protein, UA Negative Negative   Urobilinogen, UA 0.2 0.2 or 1.0 E.U./dL   Nitrite, UA Negative    Leukocytes, UA Small (1+) (A) Negative      The ASCVD Risk score (Arnett DK, et al., 2019) failed to calculate for the following reasons:   The 2019 ASCVD risk score is only valid for ages 77 to 59    Assessment & Plan:   Problem List Items Addressed This Visit   None Visit Diagnoses     Right sided abdominal pain    -  Primary   Relevant Orders   POCT Urinalysis Dipstick (Automated) (Completed)   CBC with Differential/Platelet   Urinalysis     Patient seen with 4-day history of intermittent pain right lower quadrant.  Pain not progressive and actually somewhat improved compared with Sunday.  She does have some small leukocytes on urine which is very nonspecific.  No blood.  Nonacute abdomen on exam at this time.  Apparently had similar presentation last December  -Check CBC with differential -Consider further imaging possibly with ultrasound especially if white count elevated. -Follow-up immediately for any  recurrent fever or worsening pain or recurrent vomiting -Work note written for today and tomorrow  No follow-ups on file.    Carolann Littler, MD

## 2021-12-02 NOTE — Addendum Note (Signed)
Addended by: Nilda Riggs on: 12/02/2021 03:07 PM   Modules accepted: Orders

## 2021-12-03 ENCOUNTER — Telehealth: Payer: Self-pay | Admitting: Family Medicine

## 2021-12-03 LAB — CBC WITH DIFFERENTIAL/PLATELET
Basophils Absolute: 0.1 10*3/uL (ref 0.0–0.1)
Basophils Relative: 0.6 % (ref 0.0–3.0)
Eosinophils Absolute: 0.3 10*3/uL (ref 0.0–0.7)
Eosinophils Relative: 2.3 % (ref 0.0–5.0)
HCT: 42.4 % (ref 36.0–46.0)
Hemoglobin: 13.9 g/dL (ref 12.0–15.0)
Lymphocytes Relative: 19.1 % (ref 12.0–46.0)
Lymphs Abs: 2.3 10*3/uL (ref 0.7–4.0)
MCHC: 32.9 g/dL (ref 30.0–36.0)
MCV: 78.4 fl (ref 78.0–100.0)
Monocytes Absolute: 0.8 10*3/uL (ref 0.1–1.0)
Monocytes Relative: 6.4 % (ref 3.0–12.0)
Neutro Abs: 8.7 10*3/uL — ABNORMAL HIGH (ref 1.4–7.7)
Neutrophils Relative %: 71.6 % (ref 43.0–77.0)
Platelets: 361 10*3/uL (ref 150.0–400.0)
RBC: 5.4 Mil/uL — ABNORMAL HIGH (ref 3.87–5.11)
RDW: 16.8 % — ABNORMAL HIGH (ref 11.5–14.6)
WBC: 12.2 10*3/uL — ABNORMAL HIGH (ref 4.5–10.5)

## 2021-12-03 LAB — URINE CULTURE
MICRO NUMBER:: 13853251
SPECIMEN QUALITY:: ADEQUATE

## 2021-12-03 NOTE — Telephone Encounter (Signed)
Pt was just here on 12/02/21, and saw Dr. Elease Hashimoto.  Pt says she has some follow up questions for MD and would like a call back.  Please advise.

## 2021-12-04 NOTE — Telephone Encounter (Signed)
Lab results reviewed with patient.

## 2021-12-08 NOTE — Addendum Note (Signed)
Addended by: Nilda Riggs on: 12/08/2021 09:25 AM   Modules accepted: Orders

## 2021-12-10 ENCOUNTER — Encounter: Payer: Self-pay | Admitting: Family Medicine

## 2021-12-10 ENCOUNTER — Ambulatory Visit
Admission: RE | Admit: 2021-12-10 | Discharge: 2021-12-10 | Disposition: A | Discharge status: Home / Self Care | Payer: Managed Care, Other (non HMO) | Source: Ambulatory Visit | Attending: Family Medicine | Admitting: Family Medicine

## 2021-12-10 DIAGNOSIS — R109 Unspecified abdominal pain: Secondary | ICD-10-CM

## 2021-12-11 ENCOUNTER — Telehealth: Payer: Self-pay | Admitting: Family Medicine

## 2021-12-11 NOTE — Telephone Encounter (Signed)
Pt called, returning CMA's call. CMA was unavailable. Pt asked that CMA call back at their earliest convenience. 

## 2021-12-11 NOTE — Telephone Encounter (Signed)
Please see result note 

## 2021-12-14 ENCOUNTER — Ambulatory Visit (INDEPENDENT_AMBULATORY_CARE_PROVIDER_SITE_OTHER): Payer: Managed Care, Other (non HMO) | Admitting: Bariatrics

## 2021-12-24 ENCOUNTER — Ambulatory Visit: Payer: Managed Care, Other (non HMO) | Admitting: Family Medicine

## 2021-12-24 ENCOUNTER — Encounter: Payer: Self-pay | Admitting: Family Medicine

## 2021-12-24 ENCOUNTER — Ambulatory Visit: Payer: Managed Care, Other (non HMO)

## 2021-12-24 VITALS — BP 124/86 | HR 99 | Temp 97.9°F | Wt 338.0 lb

## 2021-12-24 DIAGNOSIS — M25562 Pain in left knee: Secondary | ICD-10-CM | POA: Diagnosis not present

## 2021-12-24 MED ORDER — MELOXICAM 15 MG PO TABS: 15.0000 mg | ORAL_TABLET | Freq: Every day | ORAL | 0 refills | Status: DC

## 2021-12-24 NOTE — Progress Notes (Signed)
Subjective:    Patient ID: Terri Cook, female    DOB: 2000-12-21, 21 y.o.   MRN: [id number]  Chief Complaint  Patient presents with   Knee Pain    Hit left knee on door of shuttle transport on 8/11 really hard. Pain just really badly hurts, like when she broke her foot. Thinks something is cracked, but not sure if knee brace will hurt. Hurts when squatting, when squatted the other day it popped.     HPI Patient is a 21 yo female with pmh sig for anemia on IV iron, asthma, HTN, HLD, obesity,PCOS, whowas seen today for ongoing concern.  Patient endorses left knee pain starting 11/13/2021 after hitting left knee on car door while getting out at the shuttle [facility].  Patient was able to walk/apply pressure after incident but endorses intense pain.  Had  increased pain with squatting.  Tried Tylenol.  Symptoms improved after a few weeks until 2-3 days ago when patient was squatting at work and heard a pop in her knee.  Pt had immediate pain in L knee.    Past Medical History:  Diagnosis Date   Abnormal uterine bleeding    Acid reflux    Anemia    Asthma    Fatty liver    High blood pressure    High cholesterol    Ingrown left big toenail 09/2016   recurrent   PCOS (polycystic ovarian syndrome)    Pre-diabetes    Seasonal allergies     Allergies  Allergen Reactions   Clindamycin/Lincomycin Anaphylaxis    Trouble breathing?    ROS General: Denies fever, chills, night sweats, changes in weight, changes in appetite HEENT: Denies headaches, ear pain, changes in vision, rhinorrhea, sore throat CV: Denies CP, palpitations, SOB, orthopnea Pulm: Denies SOB, cough, wheezing GI: Denies abdominal pain, nausea, vomiting, diarrhea, constipation GU: Denies dysuria, hematuria, frequency, vaginal discharge Msk: Denies muscle cramps, joint pains +L knee pain Neuro: Denies weakness, numbness, tingling Skin: Denies rashes, bruising Psych: Denies depression, anxiety,  hallucinations     Objective:    Blood pressure 124/86, pulse 99, temperature 97.9 F (36.6 C), temperature source Oral, weight (!) 338 lb (153.3 kg), SpO2 99 %.  Gen. Pleasant, well-nourished, in no distress, normal affect   HEENT: [place]/AT, face symmetric, conjunctiva clear, no scleral icterus, PERRLA, EOMI, nares patent without drainage Lungs: no accessory muscle use Cardiovascular: RRR, no peripheral edema Musculoskeletal: No obvious edema or deformity of bilateral knees.  No crepitus of bilateral knees.  TTP of L patellar tendon superior to tibial tuberosity.  Normal patellar tracking.  No instability.  No TTP of joint line or L patella.  No cyanosis or clubbing, normal tone Neuro:  A&Ox3, CN II-XII intact, normal gait Skin:  Warm, no lesions/ rash   Wt Readings from Last 3 Encounters:  12/24/21 (!) 338 lb (153.3 kg)  12/02/21 (!) 329 lb 8 oz (149.5 kg)  11/11/21 (!) 319 lb (144.7 kg)    Lab Results  Component Value Date   WBC 12.2 (H) 12/02/2021   HGB 13.9 12/02/2021   HCT 42.4 12/02/2021   PLT 361.0 12/02/2021   GLUCOSE 104 (H) 10/22/2021   CHOL 209 (H) 09/30/2021   TRIG 225 (H) 09/30/2021   HDL 38 (L) 09/30/2021   LDLCALC 131 (H) 09/30/2021   ALT 36 10/22/2021   AST 19 10/22/2021   NA 139 10/22/2021   K 3.9 10/22/2021   CL 104 10/22/2021   CREATININE 0.67  10/22/2021   BUN 6 10/22/2021   CO2 29 10/22/2021   TSH 2.500 09/30/2021   HGBA1C 5.5 09/30/2021    Assessment/Plan:  Acute pain of left knee  -2/2 direct hit with car door, then reinjury a few days ago while squatting at work. -supportive care including compression, rest, elevation, heat, ice, topical analgesics, Tylenol and NSAIDs. -Given return of symptoms will obtain imaging.  Further recommendations based on results. -Mobic as needed -Consider PT/Ortho referral. - Plan: DG Knee Complete 4 Views Left, meloxicam (MOBIC) 15 MG tablet  F/u as needed  Grier Mitts, MD

## 2021-12-25 ENCOUNTER — Encounter: Payer: Self-pay | Admitting: Pulmonary Disease

## 2021-12-25 ENCOUNTER — Ambulatory Visit (INDEPENDENT_AMBULATORY_CARE_PROVIDER_SITE_OTHER): Payer: Managed Care, Other (non HMO) | Admitting: Pulmonary Disease

## 2021-12-25 VITALS — BP 110/70 | HR 81 | Ht 68.0 in | Wt 339.0 lb

## 2021-12-25 DIAGNOSIS — J4530 Mild persistent asthma, uncomplicated: Secondary | ICD-10-CM | POA: Diagnosis not present

## 2021-12-25 MED ORDER — ADVAIR HFA 230-21 MCG/ACT IN AERO: 2.0000 | INHALATION_SPRAY | Freq: Two times a day (BID) | RESPIRATORY_TRACT | 5 refills | Status: DC

## 2021-12-25 MED ORDER — ALBUTEROL SULFATE HFA 108 (90 BASE) MCG/ACT IN AERS: 2.0000 | INHALATION_SPRAY | Freq: Four times a day (QID) | RESPIRATORY_TRACT | 6 refills | Status: AC | PRN

## 2021-12-25 NOTE — Progress Notes (Unsigned)
Subjective:   PATIENT ID: Terri Cook GENDER: female DOB: 10/11/2000, MRN: [id number]   HPI  Chief Complaint  Patient presents with   Follow-up    Asthma gotten worse    Reason for Visit: Follow-up  Terri Cook is a 21 year old female with asthma (no childhood history), chronic anemia on IV iron, PCOS, HTN HLD who presents for follow-up.  Initial consult 05/2020 Two weeks ago she began having gradually worsening shortness of breath with exertion associated chest tightness, wheezing and nonproductive coughing. Episodes will occur activity such as climbing stairs and at rest at times. Symptoms improved after using albuterol daily up to twice a day. Symptoms seem occur at night with similar severity. She reports chronic allergies manifested as watery eyes, rhinorrhea, scratchy throat which she takes zyrtec and chloratab, which she feels helps. Allergic to dust. Note by her PCP Dr. Volanda Napoleon on 05/06/19 was reviewed. Diagnosed with asthma. Referred to Pulmonary for further evaluation. Her brother and both parents have asthma. Denies recent illness or recent sick contacts. Denies exacerbations requiring steroids.  12/25/21 She was last seen on 05/2020. Her inhaler strength was increased to Advair HFA 230 mcg TWO puffs BID however only taking as needed. She notices coughing and chest tightness with activity, laughing and deep breaths. Overall it feels like asthma symptoms are worsening. She is wheezing in the mornings. Has not been treated for asthma exacerbation however did not seek care.  Social History: Never smoker  ACT: Asthma Control Test ACT Total Score  12/25/2021 10:28 AM 19    Environmental exposures:  None Outdoor/indoor dog, rottweiler/blue heeler mix, shedding  Past Medical History:  Diagnosis Date   Abnormal uterine bleeding    Acid reflux    Anemia    Asthma    Fatty liver    High blood pressure    High cholesterol    Ingrown left big toenail 09/2016    recurrent   PCOS (polycystic ovarian syndrome)    Pre-diabetes    Seasonal allergies      Family History  Problem Relation Age of Onset   Asthma Mother    Obesity Mother    Diabetes Father    Hypertension Father    Asthma Father    High Cholesterol Father    Obesity Father    Cervical cancer Maternal Grandmother    Cirrhosis Paternal Grandmother        caused by medication   Diabetes Paternal Grandmother      Social History   Occupational History   Not on file  Tobacco Use   Smoking status: Some Days    Packs/day: 0.25    Years: 2.00    Total pack years: 0.50    Types: Cigarettes   Smokeless tobacco: Never   Tobacco comments:    1-2 a day  Vaping Use   Vaping Use: Never used  Substance and Sexual Activity   Alcohol use: No   Drug use: No   Sexual activity: Not Currently    Birth control/protection: Abstinence, OCP    Allergies  Allergen Reactions   Clindamycin/Lincomycin Anaphylaxis    Trouble breathing?     Outpatient Medications Prior to Visit  Medication Sig Dispense Refill   Acetaminophen-Caff-Pyrilamine (MIDOL MAX ST MENSTRUAL PO) Take 2 tablets by mouth 2 (two) times daily as needed (cramps).     ADVAIR HFA 230-21 MCG/ACT inhaler SMARTSIG:2 Puff(s) By Mouth Twice Daily     Ascorbic Acid (VITAMIN C PO) Take 1,000  mg by mouth daily.     aspirin-acetaminophen-caffeine (EXCEDRIN EXTRA STRENGTH) 250-250-65 MG tablet Take 2 tablets by mouth every 8 (eight) hours as needed for headache or migraine.     BALZIVA 0.4-35 MG-MCG tablet Take 1 tablet by mouth daily. 84 tablet 4   cetirizine (ZYRTEC) 10 MG tablet Take 10 mg by mouth daily as needed for allergies.     cholecalciferol (VITAMIN D3) 25 MCG (1000 UNIT) tablet Take 1,000 Units by mouth daily.     clobetasol cream (TEMOVATE) 1.44 % APPLY APPLICATION TOPICALLY TWICE DAILY TO AFFECTED AREAS OF SKIN 45 g 0   FeFum-FePoly-FA-B Cmp-C-Biot (INTEGRA PLUS) CAPS Take 1 capsule by mouth every morning. 30 capsule 2    ibuprofen (ADVIL,MOTRIN) 200 MG tablet Take 400 mg by mouth every 6 (six) hours as needed for mild pain or headache.     meloxicam (MOBIC) 15 MG tablet Take 1 tablet (15 mg total) by mouth daily. 30 tablet 0   omeprazole (PRILOSEC) 40 MG capsule Take 1 capsule by mouth twice daily (Patient taking differently: 40 mg daily.) 60 capsule 3   valACYclovir (VALTREX) 500 MG tablet Take one tablet po BID x 3 days with an outbreak 30 tablet 2   Vitamin D, Ergocalciferol, (DRISDOL) 1.25 MG (50000 UNIT) CAPS capsule Take 1 capsule (50,000 Units total) by mouth every 7 (seven) days. 5 capsule 0   zinc gluconate 50 MG tablet Take 50 mg by mouth daily.     cyclobenzaprine (FLEXERIL) 5 MG tablet Take 1 tablet (5 mg total) by mouth at bedtime as needed for muscle spasms. 20 tablet 0   No facility-administered medications prior to visit.    Review of Systems  Constitutional:  Negative for chills, diaphoresis, fever, malaise/fatigue and weight loss.  HENT:  Negative for congestion.   Respiratory:  Positive for cough, shortness of breath and wheezing. Negative for hemoptysis and sputum production.   Cardiovascular:  Negative for chest pain (chest tightness), palpitations and leg swelling.     Objective:   Vitals:   12/25/21 1030  BP: 110/70  Pulse: 81  SpO2: 97%  Weight: (!) 339 lb (153.8 kg)  Height: 5' 8"  (1.727 m)   SpO2: 97 % O2 Device: None (Room air)  Physical Exam: General: Well-appearing, no acute distress HENT: [place], AT Eyes: EOMI, no scleral icterus Respiratory: Clear to auscultation bilaterally.  No crackles, wheezing or rales Cardiovascular: RRR, -M/R/G, no JVD Extremities:-Edema,-tenderness Neuro: AAO x4, CNII-XII grossly intact Psych: Normal mood, normal affect  Data Reviewed:  Imaging: CXR 09/15/20 - No acute infiltrate, effusion or edema  PFT: None on file  Labs: CBC    Component Value Date/Time   WBC 12.2 (H) 12/02/2021 1506   RBC 5.40 (H) 12/02/2021 1506   HGB  13.9 12/02/2021 1506   HGB 14.4 10/22/2021 1248   HGB 14.8 09/28/2017 1543   HCT 42.4 12/02/2021 1506   HCT 41.9 09/30/2021 1150   PLT 361.0 12/02/2021 1506   PLT 471 (H) 10/22/2021 1248   PLT 384 09/28/2017 1543   MCV 78.4 12/02/2021 1506   MCV 83 09/28/2017 1543   MCH 25.1 (L) 10/22/2021 1248   MCHC 32.9 12/02/2021 1506   RDW 16.8 (H) 12/02/2021 1506   RDW 12.9 09/28/2017 1543   LYMPHSABS 2.3 12/02/2021 1506   MONOABS 0.8 12/02/2021 1506   EOSABS 0.3 12/02/2021 1506   BASOSABS 0.1 12/02/2021 1506  Absolute Eos  08/28/15 - 288 12/02/21 - 300     Assessment & Plan:  Discussion: 21 year old female never smoker with adult onset asthma who presents for follow-up. Non adherent to ICS/LABA. Discussed clinical course and management of asthma and counseled on bronchodilator regimen as noted below and action plan for exacerbation.  Mild persistent asthma - uncontrolled --START Advair 230-21 mcg TWO puffs TWICE a day. This is your EVERYDAY inhaler  --CONTINUE Albuterol AS NEEDED for shortness of breath or wheezing. This is your RESCUE inhaler  Asthma Action Plan For worsening shortness of breath, wheezing and cough use albuterol for rescue. If you symptoms do not improve in 24-48 hours, please our office for evaluation and/or prednisone taper.  Health Maintenance Immunization History  Administered Date(s) Administered   DTaP 02/08/2001, 05/17/2001, 06/29/2001, 04/25/2002, 10/12/2005   HIB (PRP-T) 02/08/2001, 05/17/2001, 06/29/2001, 04/25/2002   HPV 9-valent 12/19/2014   HPV Quadrivalent 09/07/2013   Hepatitis A, Ped/Adol-2 Dose 03/10/2010, 09/07/2013   Hepatitis B, PED/ADOLESCENT 06/19/2000, 02/08/2001, 06/29/2001   IPV 02/08/2001, 05/17/2001, 02/11/2002, 10/12/2005   Influenza Split 01/11/2002, 02/13/2002, 02/25/2003   MMR 01/11/2002, 10/12/2005   Meningococcal Conjugate 09/07/2013   Pneumococcal-Unspecified 02/08/2001, 05/17/2001, 06/29/2001, 04/25/2002   Tdap 09/07/2013    Varicella 01/11/2002, 10/12/2005   CT Lung Screen - not indicated  No orders of the defined types were placed in this encounter.  Meds ordered this encounter  Medications   albuterol (VENTOLIN HFA) 108 (90 Base) MCG/ACT inhaler    Sig: Inhale 2 puffs into the lungs every 6 (six) hours as needed for wheezing or shortness of breath.    Dispense:  8 g    Refill:  6   ADVAIR HFA 230-21 MCG/ACT inhaler    Sig: Inhale 2 puffs into the lungs 2 (two) times daily.    Dispense:  12 g    Refill:  5    Return in about 2 months (around 02/24/2022).  I have spent a total time of 35-minutes on the day of the appointment including chart review, data review, collecting history, coordinating care and discussing medical diagnosis and plan with the patient/family. Past medical history, allergies, medications were reviewed. Pertinent imaging, labs and tests included in this note have been reviewed and interpreted independently by me.  [place], MD [facility] 12/25/2021 10:44 AM  Office Number [phone]

## 2021-12-25 NOTE — Patient Instructions (Signed)
Mild persistent asthma --START Advair 230-21 mcg TWO puffs TWICE a day. This is your EVERYDAY inhaler  --CONTINUE Albuterol AS NEEDED for shortness of breath or wheezing. This is your RESCUE inhaler  Asthma Action Plan For worsening shortness of breath, wheezing and cough use albuterol for rescue. If you symptoms do not improve in 24-48 hours, please our office for evaluation and/or prednisone taper.  Follow-up with me in 2 months

## 2021-12-31 ENCOUNTER — Encounter: Payer: Self-pay | Admitting: Pulmonary Disease

## 2022-01-14 ENCOUNTER — Other Ambulatory Visit: Payer: Self-pay | Admitting: Obstetrics and Gynecology

## 2022-01-14 DIAGNOSIS — Z3041 Encounter for surveillance of contraceptive pills: Secondary | ICD-10-CM

## 2022-01-14 NOTE — Telephone Encounter (Signed)
Patient scheduled on 02/03/22 Last exam was 01/24/21

## 2022-01-19 NOTE — Progress Notes (Unsigned)
[facility] OFFICE PROGRESS NOTE  Billie Ruddy, MD [place] [address]  DIAGNOSIS: Iron deficiency anemia  PRIOR THERAPY: None  CURRENT THERAPY: 1 tablet p.o. daily of Integra plus.  Started 10/22/21  INTERVAL HISTORY: Terri Cook 21 y.o. female returns to clinic today for follow-up visit.  The patient was first seen in the clinic on 10/22/2021.  The patient had new onset anemia first seen in February 2023.  The patient started iron supplements at her last appointment she tolerates this well without any concerning adverse side effects.  Overall, the patient is feeling well she denies any abnormal bleeding or bruising.  She has regular menstrual cycles for which she follows with Dr. Sabra Heck from OB/GYN.  She has a menstrual cycle every 4 weeks with occasional light spotting in between her cycles.  Fatigue?  She denies any lightheadedness, force of breath, syncope, or palpitations.  Ice chips?  She is here today for evaluation and repeat blood work.    MEDICAL HISTORY: Past Medical History:  Diagnosis Date   Abnormal uterine bleeding    Acid reflux    Anemia    Asthma    Fatty liver    High blood pressure    High cholesterol    Ingrown left big toenail 09/2016   recurrent   PCOS (polycystic ovarian syndrome)    Pre-diabetes    Seasonal allergies     ALLERGIES:  is allergic to clindamycin/lincomycin.  MEDICATIONS:  Current Outpatient Medications  Medication Sig Dispense Refill   Acetaminophen-Caff-Pyrilamine (MIDOL MAX ST MENSTRUAL PO) Take 2 tablets by mouth 2 (two) times daily as needed (cramps).     ADVAIR HFA 230-21 MCG/ACT inhaler Inhale 2 puffs into the lungs 2 (two) times daily. 12 g 5   albuterol (VENTOLIN HFA) 108 (90 Base) MCG/ACT inhaler Inhale 2 puffs into the lungs every 6 (six) hours as needed for wheezing or shortness of breath. 8 g 6   Ascorbic Acid (VITAMIN C PO) Take 1,000 mg by mouth daily.      aspirin-acetaminophen-caffeine (EXCEDRIN EXTRA STRENGTH) 250-250-65 MG tablet Take 2 tablets by mouth every 8 (eight) hours as needed for headache or migraine.     BALZIVA 0.4-35 MG-MCG tablet Take 1 tablet by mouth once daily 84 tablet 0   cetirizine (ZYRTEC) 10 MG tablet Take 10 mg by mouth daily as needed for allergies.     cholecalciferol (VITAMIN D3) 25 MCG (1000 UNIT) tablet Take 1,000 Units by mouth daily.     clobetasol cream (TEMOVATE) 1.30 % APPLY APPLICATION TOPICALLY TWICE DAILY TO AFFECTED AREAS OF SKIN 45 g 0   FeFum-FePoly-FA-B Cmp-C-Biot (INTEGRA PLUS) CAPS Take 1 capsule by mouth every morning. 30 capsule 2   ibuprofen (ADVIL,MOTRIN) 200 MG tablet Take 400 mg by mouth every 6 (six) hours as needed for mild pain or headache.     meloxicam (MOBIC) 15 MG tablet Take 1 tablet (15 mg total) by mouth daily. 30 tablet 0   omeprazole (PRILOSEC) 40 MG capsule Take 1 capsule by mouth twice daily (Patient taking differently: 40 mg daily.) 60 capsule 3   valACYclovir (VALTREX) 500 MG tablet Take one tablet po BID x 3 days with an outbreak 30 tablet 2   Vitamin D, Ergocalciferol, (DRISDOL) 1.25 MG (50000 UNIT) CAPS capsule Take 1 capsule (50,000 Units total) by mouth every 7 (seven) days. 5 capsule 0   zinc gluconate 50 MG tablet Take 50 mg by mouth daily.  No current facility-administered medications for this visit.    SURGICAL HISTORY:  Past Surgical History:  Procedure Laterality Date   GALLBLADDER SURGERY  2021   LESION EXCISION Left 07/11/2014   Procedure: EXCISION OF BENIGN LESIONS INCLUDING MARGINS ;  Surgeon: Gerald Stabs, MD;  Location: [facility];  Service: Pediatrics;  Laterality: Left;  mid back  and right neck   TOENAIL EXCISION  01/27/2012   Procedure: MINOR TOENAIL EXCISION;  Surgeon: Jerilynn Mages. Gerald Stabs, MD;  Location: [facility];  Service: Pediatrics;  Laterality: Left;   TOENAIL EXCISION  03/23/2012   Procedure: MINOR TOENAIL EXCISION;   Surgeon: Jerilynn Mages. Gerald Stabs, MD;  Location: [facility];  Service: Pediatrics;  Laterality: Right;   TOENAIL EXCISION Right 09/21/2012   Procedure: Partial Excision of Right Toenail;  Surgeon: Jerilynn Mages. Gerald Stabs, MD;  Location: [facility];  Service: Pediatrics;  Laterality: Right;   TOENAIL EXCISION Left 02/15/2013   Procedure: PARTIAL TOENAIL EXCISION ON THE LEFT BIG TOE AT THE OUTER LATERAL FOLD WITH PHENOL (MP ROOM) ;  Surgeon: Jerilynn Mages. Gerald Stabs, MD;  Location: [facility];  Service: Pediatrics;  Laterality: Left;   TOENAIL EXCISION Left 08/16/2013   Procedure: PARTIAL EXCISION OF LEFT BIG TOE NAIL FROM OUTER LATERAL FOLD;  Surgeon: Jerilynn Mages. Gerald Stabs, MD;  Location: [facility];  Service: Pediatrics;  Laterality: Left;   TOENAIL EXCISION Left 09/16/2016   Procedure: PARTIAL PERMANENT EXCISION OF LEFT INGROWN BIG TOENAIL;  Surgeon: Gerald Stabs, MD;  Location: [facility];  Service: General;  Laterality: Left;   WISDOM TOOTH EXTRACTION  12/08/2018   all 4    REVIEW OF SYSTEMS:   Review of Systems  Constitutional: Negative for appetite change, chills, fatigue, fever and unexpected weight change.  HENT:   Negative for mouth sores, nosebleeds, sore throat and trouble swallowing.   Eyes: Negative for eye problems and icterus.  Respiratory: Negative for cough, hemoptysis, shortness of breath and wheezing.   Cardiovascular: Negative for chest pain and leg swelling.  Gastrointestinal: Negative for abdominal pain, constipation, diarrhea, nausea and vomiting.  Genitourinary: Negative for bladder incontinence, difficulty urinating, dysuria, frequency and hematuria.   Musculoskeletal: Negative for back pain, gait problem, neck pain and neck stiffness.  Skin: Negative for itching and rash.  Neurological: Negative for dizziness, extremity weakness, gait problem, headaches, light-headedness and seizures.  Hematological: Negative  for adenopathy. Does not bruise/bleed easily.  Psychiatric/Behavioral: Negative for confusion, depression and sleep disturbance. The patient is not nervous/anxious.     PHYSICAL EXAMINATION:  There were no vitals taken for this visit.  ECOG PERFORMANCE STATUS: {CHL ONC ECOG [id number]  Physical Exam  Constitutional: Oriented to person, place, and time and well-developed, well-nourished, and in no distress. No distress.  HENT:  Head: Normocephalic and atraumatic.  Mouth/Throat: Oropharynx is clear and moist. No oropharyngeal exudate.  Eyes: Conjunctivae are normal. Right eye exhibits no discharge. Left eye exhibits no discharge. No scleral icterus.  Neck: Normal range of motion. Neck supple.  Cardiovascular: Normal rate, regular rhythm, normal heart sounds and intact distal pulses.   Pulmonary/Chest: Effort normal and breath sounds normal. No respiratory distress. No wheezes. No rales.  Abdominal: Soft. Bowel sounds are normal. Exhibits no distension and no mass. There is no tenderness.  Musculoskeletal: Normal range of motion. Exhibits no edema.  Lymphadenopathy:    No cervical adenopathy.  Neurological: Alert and oriented to person, place, and time. Exhibits normal muscle tone. Gait  normal. Coordination normal.  Skin: Skin is warm and dry. No rash noted. Not diaphoretic. No erythema. No pallor.  Psychiatric: Mood, memory and judgment normal.  Vitals reviewed.  LABORATORY DATA: Lab Results  Component Value Date   WBC 12.2 (H) 12/02/2021   HGB 13.9 12/02/2021   HCT 42.4 12/02/2021   MCV 78.4 12/02/2021   PLT 361.0 12/02/2021      Chemistry      Component Value Date/Time   NA 139 10/22/2021 1248   NA 138 09/30/2021 1150   K 3.9 10/22/2021 1248   CL 104 10/22/2021 1248   CO2 29 10/22/2021 1248   BUN 6 10/22/2021 1248   BUN 7 09/30/2021 1150   CREATININE 0.67 10/22/2021 1248   CREATININE 0.79 09/27/2017 0000      Component Value Date/Time   CALCIUM 9.4 10/22/2021  1248   ALKPHOS 76 10/22/2021 1248   AST 19 10/22/2021 1248   ALT 36 10/22/2021 1248   BILITOT 0.7 10/22/2021 1248       RADIOGRAPHIC STUDIES:  DG Knee Complete 4 Views Left  Result Date: 12/25/2021 CLINICAL DATA:  Hit left knee against car door 5 weeks ago, pain and popping with squatting more recently EXAM: LEFT KNEE - COMPLETE 4+ VIEW COMPARISON:  None Available. FINDINGS: No evidence of fracture, dislocation, or joint effusion. No evidence of arthropathy. Incidental note of a definitively benign, sclerotic nonossifying fibroma of the medial left proximal tibial metadiaphysis. Soft tissues are unremarkable. IMPRESSION: No fracture or dislocation of the left knee. Joint spaces are well preserved. No knee joint effusion. Electronically Signed   By: Delanna Ahmadi M.D.   On: 12/25/2021 21:50     ASSESSMENT/PLAN:  This is a very pleasant 21 year old female referred to clinic for iron deficiency without anemia.   She is taking integra plus 1 capsule p.o. daily.  She has been on this for approximately 2 months.  Patient had a repeat CBC, iron studies, and ferritin drawn today.  The patient's labs show ***  Unlikely the patient will require IV iron unless she has significant iron deficiency on labs.  We will see her back for follow-up visit for labs and follow-up visit in 6 months.  The patient was advised to call immediately if she has any concerning symptoms in the interval. The patient voices understanding of current disease status and treatment options and is in agreement with the current care plan. All questions were answered. The patient knows to call the clinic with any problems, questions or concerns. We can certainly see the patient much sooner if necessary    No orders of the defined types were placed in this encounter.    I spent {CHL ONC TIME VISIT - BWIOM:[id number]} counseling the patient face to face. The total time spent in the appointment was {CHL ONC TIME VISIT -  GTXMI:[id number]}.  Ceri Mayer L Cherylynn Liszewski, PA-C 01/19/22

## 2022-01-21 ENCOUNTER — Inpatient Hospital Stay (HOSPITAL_BASED_OUTPATIENT_CLINIC_OR_DEPARTMENT_OTHER): Payer: Managed Care, Other (non HMO) | Admitting: Physician Assistant

## 2022-01-21 ENCOUNTER — Inpatient Hospital Stay: Payer: Managed Care, Other (non HMO) | Attending: Physician Assistant

## 2022-01-21 ENCOUNTER — Other Ambulatory Visit: Payer: Self-pay

## 2022-01-21 VITALS — BP 124/77 | HR 78 | Temp 98.1°F | Resp 17 | Ht 68.0 in | Wt 339.1 lb

## 2022-01-21 DIAGNOSIS — Z881 Allergy status to other antibiotic agents status: Secondary | ICD-10-CM | POA: Diagnosis not present

## 2022-01-21 DIAGNOSIS — E282 Polycystic ovarian syndrome: Secondary | ICD-10-CM | POA: Insufficient documentation

## 2022-01-21 DIAGNOSIS — J45909 Unspecified asthma, uncomplicated: Secondary | ICD-10-CM | POA: Diagnosis not present

## 2022-01-21 DIAGNOSIS — Z7951 Long term (current) use of inhaled steroids: Secondary | ICD-10-CM | POA: Diagnosis not present

## 2022-01-21 DIAGNOSIS — R0602 Shortness of breath: Secondary | ICD-10-CM | POA: Diagnosis not present

## 2022-01-21 DIAGNOSIS — F32A Depression, unspecified: Secondary | ICD-10-CM | POA: Diagnosis not present

## 2022-01-21 DIAGNOSIS — Z79899 Other long term (current) drug therapy: Secondary | ICD-10-CM | POA: Insufficient documentation

## 2022-01-21 DIAGNOSIS — D509 Iron deficiency anemia, unspecified: Secondary | ICD-10-CM | POA: Diagnosis not present

## 2022-01-21 DIAGNOSIS — F419 Anxiety disorder, unspecified: Secondary | ICD-10-CM | POA: Insufficient documentation

## 2022-01-21 DIAGNOSIS — K219 Gastro-esophageal reflux disease without esophagitis: Secondary | ICD-10-CM | POA: Insufficient documentation

## 2022-01-21 DIAGNOSIS — R5383 Other fatigue: Secondary | ICD-10-CM | POA: Insufficient documentation

## 2022-01-21 DIAGNOSIS — E611 Iron deficiency: Secondary | ICD-10-CM | POA: Diagnosis not present

## 2022-01-21 DIAGNOSIS — R059 Cough, unspecified: Secondary | ICD-10-CM | POA: Diagnosis not present

## 2022-01-21 DIAGNOSIS — Z79624 Long term (current) use of inhibitors of nucleotide synthesis: Secondary | ICD-10-CM | POA: Diagnosis not present

## 2022-01-21 LAB — IRON AND IRON BINDING CAPACITY (CC-WL,HP ONLY)
Iron: 52 ug/dL (ref 28–170)
Saturation Ratios: 11 % (ref 10.4–31.8)
TIBC: 456 ug/dL — ABNORMAL HIGH (ref 250–450)
UIBC: 404 ug/dL (ref 148–442)

## 2022-01-21 LAB — CBC WITH DIFFERENTIAL (CANCER CENTER ONLY)
Abs Immature Granulocytes: 0.04 10*3/uL (ref 0.00–0.07)
Basophils Absolute: 0.1 10*3/uL (ref 0.0–0.1)
Basophils Relative: 1 %
Eosinophils Absolute: 0.3 10*3/uL (ref 0.0–0.5)
Eosinophils Relative: 3 %
HCT: 43.6 % (ref 36.0–46.0)
Hemoglobin: 15.1 g/dL — ABNORMAL HIGH (ref 12.0–15.0)
Immature Granulocytes: 0 %
Lymphocytes Relative: 23 %
Lymphs Abs: 2.1 10*3/uL (ref 0.7–4.0)
MCH: 27.6 pg (ref 26.0–34.0)
MCHC: 34.6 g/dL (ref 30.0–36.0)
MCV: 79.7 fL — ABNORMAL LOW (ref 80.0–100.0)
Monocytes Absolute: 0.6 10*3/uL (ref 0.1–1.0)
Monocytes Relative: 7 %
Neutro Abs: 6.3 10*3/uL (ref 1.7–7.7)
Neutrophils Relative %: 66 %
Platelet Count: 399 10*3/uL (ref 150–400)
RBC: 5.47 MIL/uL — ABNORMAL HIGH (ref 3.87–5.11)
RDW: 13.8 % (ref 11.5–15.5)
WBC Count: 9.5 10*3/uL (ref 4.0–10.5)
nRBC: 0 % (ref 0.0–0.2)

## 2022-01-21 LAB — FERRITIN: Ferritin: 24 ng/mL (ref 11–307)

## 2022-01-22 ENCOUNTER — Ambulatory Visit: Payer: Managed Care, Other (non HMO) | Admitting: Physician Assistant

## 2022-01-22 ENCOUNTER — Other Ambulatory Visit: Payer: Managed Care, Other (non HMO)

## 2022-01-22 ENCOUNTER — Telehealth: Payer: Self-pay | Admitting: Physician Assistant

## 2022-01-22 NOTE — Telephone Encounter (Signed)
I called the patient to let her know she does not need an iron infusion. Her ferritin is on the low end of the normal range at 24. Her saturation is on the low end of normal at 11. Her TIBC is still elevated. I called her and let her know to take her iron supplement p.o every other day or so. She expressed understanding.

## 2022-01-28 ENCOUNTER — Ambulatory Visit: Payer: Managed Care, Other (non HMO) | Admitting: Family Medicine

## 2022-02-03 ENCOUNTER — Ambulatory Visit: Payer: Managed Care, Other (non HMO) | Admitting: Obstetrics and Gynecology

## 2022-02-04 ENCOUNTER — Encounter: Payer: Self-pay | Admitting: Family Medicine

## 2022-02-04 ENCOUNTER — Ambulatory Visit (INDEPENDENT_AMBULATORY_CARE_PROVIDER_SITE_OTHER): Payer: Managed Care, Other (non HMO) | Admitting: Family Medicine

## 2022-02-04 VITALS — BP 126/82 | HR 56 | Temp 98.0°F | Wt 340.6 lb

## 2022-02-04 DIAGNOSIS — Z9049 Acquired absence of other specified parts of digestive tract: Secondary | ICD-10-CM | POA: Diagnosis not present

## 2022-02-04 DIAGNOSIS — R197 Diarrhea, unspecified: Secondary | ICD-10-CM

## 2022-02-04 NOTE — Patient Instructions (Signed)
You can try over-the-counter Imodium A-D (loperamide).  This is an antidiarrheal medication that can be found on the shelf at your local drugstore.  Loperamide is the generic name for the medication.  Follow the instructions on the package.

## 2022-02-04 NOTE — Progress Notes (Signed)
Subjective:    Patient ID: Terri Cook, female    DOB: 02-05-2001, 21 y.o.   MRN: [id number]  Chief Complaint  Patient presents with   Diarrhea    For about 2 weeks, ibs runs in family. OTC medications is helping slightly, past 2 days was better but still diarrhea.     HPI Patient is a 21 yo female with pmh sig fro GERD, was seen today for ongoing concern.  Patient endorses diarrhea x2 weeks.  States symptoms started after eating a cheeseburger that caused upset stomach.  Patient status post cholecystectomy, has a BM after eating most foods.  States tried Pepto-Bismol and heat as continued to have loose, watery stools and cramping.  Was in the bathroom every 1 to 1.5 hours.  Endorses soreness in rectum and spasms.  Denies seeing blood in stools or on toilet.  Currently eating things like salad and toast.  Staying hydrated.  Patient endorses history of IBS and grandmother.  Past Medical History:  Diagnosis Date   Abnormal uterine bleeding    Acid reflux    Anemia    Asthma    Fatty liver    High blood pressure    High cholesterol    Ingrown left big toenail 09/2016   recurrent   PCOS (polycystic ovarian syndrome)    Pre-diabetes    Seasonal allergies     Allergies  Allergen Reactions   Clindamycin/Lincomycin Anaphylaxis    Trouble breathing?    ROS General: Denies fever, chills, night sweats, changes in weight +changes in appetite HEENT: Denies headaches, ear pain, changes in vision, rhinorrhea, sore throat CV: Denies CP, palpitations, SOB, orthopnea Pulm: Denies SOB, cough, wheezing GI: Denies vomiting,  constipation  +mild nausea, abd cramping, diarrhea GU: Denies dysuria, hematuria, frequency, vaginal discharge Msk: Denies muscle cramps, joint pains Neuro: Denies weakness, numbness, tingling Skin: Denies rashes, bruising Psych: Denies depression, anxiety, hallucinations     Objective:    Blood pressure 126/82, pulse (!) 56, temperature 98 F (36.7 C), temperature  source Oral, weight (!) 340 lb 9.6 oz (154.5 kg), SpO2 99 %.  Gen. Pleasant, well-nourished, in no distress, normal affect   HEENT: [place]/AT, face symmetric, conjunctiva clear, no scleral icterus, PERRLA, EOMI, nares patent without drainage Lungs: no accessory muscle use, CTAB, no wheezes or rales Cardiovascular: RRR, no m/r/g, no peripheral edema Abdomen: BS present, soft, mild TTP in LUQ, ND, no hepatosplenomegaly. Neuro:  A&Ox3, CN II-XII intact, normal gait  Wt Readings from Last 3 Encounters:  02/04/22 (!) 340 lb 9.6 oz (154.5 kg)  01/21/22 (!) 339 lb 1.6 oz (153.8 kg)  12/25/21 (!) 339 lb (153.8 kg)    Lab Results  Component Value Date   WBC 9.5 01/21/2022   HGB 15.1 (H) 01/21/2022   HCT 43.6 01/21/2022   PLT 399 01/21/2022   GLUCOSE 104 (H) 10/22/2021   CHOL 209 (H) 09/30/2021   TRIG 225 (H) 09/30/2021   HDL 38 (L) 09/30/2021   LDLCALC 131 (H) 09/30/2021   ALT 36 10/22/2021   AST 19 10/22/2021   NA 139 10/22/2021   K 3.9 10/22/2021   CL 104 10/22/2021   CREATININE 0.67 10/22/2021   BUN 6 10/22/2021   CO2 29 10/22/2021   TSH 2.500 09/30/2021   HGBA1C 5.5 09/30/2021    Assessment/Plan:  Functional diarrhea - Plan: Ambulatory referral to Gastroenterology  Status post cholecystectomy - Plan: Ambulatory referral to Gastroenterology  Discussed possible causes of malabsorption including IBS history of cholecystectomy, etc.  Bland diet encouraged, advance as tolerated.  Advised to use OTC loperamide.  Increase intake of fiber as needed.  Given handout.  Given continued symptoms referral placed to GI.   F/u prn  Grier Mitts, MD

## 2022-02-08 ENCOUNTER — Encounter: Payer: Self-pay | Admitting: Gastroenterology

## 2022-03-02 ENCOUNTER — Ambulatory Visit (INDEPENDENT_AMBULATORY_CARE_PROVIDER_SITE_OTHER): Payer: Managed Care, Other (non HMO) | Admitting: Pulmonary Disease

## 2022-03-02 ENCOUNTER — Encounter (HOSPITAL_BASED_OUTPATIENT_CLINIC_OR_DEPARTMENT_OTHER): Payer: Self-pay | Admitting: Pulmonary Disease

## 2022-03-02 VITALS — BP 108/82 | HR 91 | Ht 68.0 in | Wt 340.6 lb

## 2022-03-02 DIAGNOSIS — J4531 Mild persistent asthma with (acute) exacerbation: Secondary | ICD-10-CM | POA: Diagnosis not present

## 2022-03-02 MED ORDER — ADVAIR HFA 230-21 MCG/ACT IN AERO: 2.0000 | INHALATION_SPRAY | Freq: Two times a day (BID) | RESPIRATORY_TRACT | 5 refills | Status: AC

## 2022-03-02 MED ORDER — PREDNISONE 10 MG PO TABS: ORAL_TABLET | ORAL | 0 refills | Status: AC

## 2022-03-02 NOTE — Progress Notes (Signed)
Subjective:   PATIENT ID: Terri Cook GENDER: female DOB: 2000-09-18, MRN: [id number]   HPI  Chief Complaint  Patient presents with   Follow-up    Having a cough for about 2wks and would like something to help it along    Reason for Visit: Follow-up  Ms. Terri Cook is a 21 year old female with asthma (no childhood history), chronic anemia on IV iron, PCOS, HTN HLD who presents for follow-up.  Initial consult 05/2020 Two weeks ago she began having gradually worsening shortness of breath with exertion associated chest tightness, wheezing and nonproductive coughing. Episodes will occur activity such as climbing stairs and at rest at times. Symptoms improved after using albuterol daily up to twice a day. Symptoms seem occur at night with similar severity. She reports chronic allergies manifested as watery eyes, rhinorrhea, scratchy throat which she takes zyrtec and chloratab, which she feels helps. Allergic to dust. Note by her PCP Dr. Volanda Napoleon on 05/06/19 was reviewed. Diagnosed with asthma. Referred to Pulmonary for further evaluation. Her brother and both parents have asthma. Denies recent illness or recent sick contacts. Denies exacerbations requiring steroids.  12/25/21 She was last seen on 05/2020. Her inhaler strength was increased to Advair HFA 230 mcg TWO puffs BID however only taking as needed. She notices coughing and chest tightness with activity, laughing and deep breaths. Overall it feels like asthma symptoms are worsening. She is wheezing in the mornings. Has not been treated for asthma exacerbation however did not seek care.  03/02/22 Since our last visit she has had improved control after compliance with her Advair. However in the last 2 weeks she has had multiple sick contacts with viral infection and developed shortness of breath, cough and wheezing that occurs during the day and night. The cough has been the most persistent despite delsym and over the counter meds.    Social History: Never smoker  ACT: Asthma Control Test ACT Total Score  03/02/2022  8:22 AM 19  12/25/2021 10:28 AM 19    2023 Jan Feb Mar April May June July Aug Sept Oct Nov Dec             X   2024 Jan Feb Mar April May June July Aug Sept Oct Nov Dec                2025 Jan Feb Mar April May June July Aug Sept Oct Nov Dec                   Environmental exposures:  None Outdoor/indoor dog, rottweiler/blue heeler mix, shedding  Past Medical History:  Diagnosis Date   Abnormal uterine bleeding    Acid reflux    Anemia    Asthma    Fatty liver    High blood pressure    High cholesterol    Ingrown left big toenail 09/2016   recurrent   PCOS (polycystic ovarian syndrome)    Pre-diabetes    Seasonal allergies      Family History  Problem Relation Age of Onset   Asthma Mother    Obesity Mother    Diabetes Father    Hypertension Father    Asthma Father    High Cholesterol Father    Obesity Father    Cervical cancer Maternal Grandmother    Cirrhosis Paternal Grandmother        caused by medication   Diabetes Paternal Grandmother      Social History   Occupational  History   Not on file  Tobacco Use   Smoking status: Some Days    Packs/day: 0.25    Years: 2.00    Total pack years: 0.50    Types: Cigarettes   Smokeless tobacco: Never   Tobacco comments:    1-2 a day  Vaping Use   Vaping Use: Never used  Substance and Sexual Activity   Alcohol use: No   Drug use: No   Sexual activity: Not Currently    Birth control/protection: Abstinence, OCP    Allergies  Allergen Reactions   Clindamycin/Lincomycin Anaphylaxis    Trouble breathing?     Outpatient Medications Prior to Visit  Medication Sig Dispense Refill   Acetaminophen-Caff-Pyrilamine (MIDOL MAX ST MENSTRUAL PO) Take 2 tablets by mouth 2 (two) times daily as needed (cramps).     albuterol (VENTOLIN HFA) 108 (90 Base) MCG/ACT inhaler Inhale 2 puffs into the lungs every 6 (six) hours as  needed for wheezing or shortness of breath. 8 g 6   Ascorbic Acid (VITAMIN C PO) Take 1,000 mg by mouth daily.     aspirin-acetaminophen-caffeine (EXCEDRIN EXTRA STRENGTH) 250-250-65 MG tablet Take 2 tablets by mouth every 8 (eight) hours as needed for headache or migraine.     BALZIVA 0.4-35 MG-MCG tablet Take 1 tablet by mouth once daily 84 tablet 0   cetirizine (ZYRTEC) 10 MG tablet Take 10 mg by mouth daily as needed for allergies.     cholecalciferol (VITAMIN D3) 25 MCG (1000 UNIT) tablet Take 1,000 Units by mouth daily.     clobetasol cream (TEMOVATE) 5.46 % APPLY APPLICATION TOPICALLY TWICE DAILY TO AFFECTED AREAS OF SKIN 45 g 0   FeFum-FePoly-FA-B Cmp-C-Biot (INTEGRA PLUS) CAPS Take 1 capsule by mouth every morning. 30 capsule 2   ibuprofen (ADVIL,MOTRIN) 200 MG tablet Take 400 mg by mouth every 6 (six) hours as needed for mild pain or headache.     meloxicam (MOBIC) 15 MG tablet Take 1 tablet (15 mg total) by mouth daily. 30 tablet 0   valACYclovir (VALTREX) 500 MG tablet Take one tablet po BID x 3 days with an outbreak 30 tablet 2   Vitamin D, Ergocalciferol, (DRISDOL) 1.25 MG (50000 UNIT) CAPS capsule Take 1 capsule (50,000 Units total) by mouth every 7 (seven) days. 5 capsule 0   zinc gluconate 50 MG tablet Take 50 mg by mouth daily.     ADVAIR HFA 230-21 MCG/ACT inhaler Inhale 2 puffs into the lungs 2 (two) times daily. 12 g 5   omeprazole (PRILOSEC) 40 MG capsule Take 1 capsule by mouth twice daily (Patient not taking: Reported on 02/04/2022) 60 capsule 3   No facility-administered medications prior to visit.    Review of Systems  Constitutional:  Negative for chills, diaphoresis, fever, malaise/fatigue and weight loss.  HENT:  Positive for congestion.   Respiratory:  Positive for cough, shortness of breath and wheezing. Negative for hemoptysis and sputum production.   Cardiovascular:  Negative for chest pain, palpitations and leg swelling.     Objective:   Vitals:    03/02/22 0822  BP: 108/82  Pulse: 91  SpO2: 97%  Weight: (!) 340 lb 9.8 oz (154.5 kg)  Height: _0  (1.727 m)   SpO2: 97 % O2 Device: None (Room air)  Physical Exam: General: Well-appearing, no acute distress HENT: [place], AT Eyes: EOMI, no scleral icterus Respiratory: Diminished with expiratory wheeze bilaterally Cardiovascular: RRR, -M/R/G, no JVD Extremities:-Edema,-tenderness Neuro: AAO x4, CNII-XII grossly intact Psych: Normal mood,  normal affect  Data Reviewed:  Imaging: CXR 09/15/20 - No acute infiltrate, effusion or edema  PFT: None on file  Labs: CBC    Component Value Date/Time   WBC 9.5 01/21/2022 0848   WBC 12.2 (H) 12/02/2021 1506   RBC 5.47 (H) 01/21/2022 0848   HGB 15.1 (H) 01/21/2022 0848   HGB 14.8 09/28/2017 1543   HCT 43.6 01/21/2022 0848   HCT 41.9 09/30/2021 1150   PLT 399 01/21/2022 0848   PLT 384 09/28/2017 1543   MCV 79.7 (L) 01/21/2022 0848   MCV 83 09/28/2017 1543   MCH 27.6 01/21/2022 0848   MCHC 34.6 01/21/2022 0848   RDW 13.8 01/21/2022 0848   RDW 12.9 09/28/2017 1543   LYMPHSABS 2.1 01/21/2022 0848   MONOABS 0.6 01/21/2022 0848   EOSABS 0.3 01/21/2022 0848   BASOSABS 0.1 01/21/2022 0848  Absolute Eos  08/28/15 - 288 12/02/21 - 300 01/21/22 - 300     Assessment & Plan:   Discussion: 21 year old female never smoker with adult onset asthma who presents for follow-up. Improved control with compliance.  Currently in exacerbation with recent sick contact exposure. Discussed clinical course and management of asthma including bronchodilator regimen and action plan for exacerbation.  Mild persistent asthma - improved control Asthma exacerbation --CONTINUE Advair 230-21 mcg TWO puffs TWICE a day. This is your EVERYDAY inhaler  --CONTINUE Albuterol AS NEEDED for shortness of breath or wheezing. This is your RESCUE inhaler --Prednisone ordered  Asthma Action Plan For worsening shortness of breath, wheezing and cough use albuterol for  rescue. If you symptoms do not improve in 24-48 hours, please our office for evaluation and/or prednisone taper.  Health Maintenance Immunization History  Administered Date(s) Administered   DTaP 02/08/2001, 05/17/2001, 06/29/2001, 04/25/2002, 10/12/2005   HIB (PRP-T) 02/08/2001, 05/17/2001, 06/29/2001, 04/25/2002   HPV 9-valent 12/19/2014   HPV Quadrivalent 09/07/2013   Hepatitis A, Ped/Adol-2 Dose 03/10/2010, 09/07/2013   Hepatitis B, PED/ADOLESCENT Jun 10, 2000, 02/08/2001, 06/29/2001   IPV 02/08/2001, 05/17/2001, 02/11/2002, 10/12/2005   Influenza Split 01/11/2002, 02/13/2002, 02/25/2003   MMR 01/11/2002, 10/12/2005   Meningococcal Conjugate 09/07/2013   Pneumococcal-Unspecified 02/08/2001, 05/17/2001, 06/29/2001, 04/25/2002   Tdap 09/07/2013   Varicella 01/11/2002, 10/12/2005   CT Lung Screen - not indicated  No orders of the defined types were placed in this encounter.  Meds ordered this encounter  Medications   ADVAIR HFA 230-21 MCG/ACT inhaler    Sig: Inhale 2 puffs into the lungs 2 (two) times daily.    Dispense:  12 g    Refill:  5   predniSONE (DELTASONE) 10 MG tablet    Sig: Take 4 tablets (40 mg total) by mouth daily with breakfast for 2 days, THEN 3 tablets (30 mg total) daily with breakfast for 2 days, THEN 2 tablets (20 mg total) daily with breakfast for 2 days, THEN 1 tablet (10 mg total) daily with breakfast for 2 days.    Dispense:  20 tablet    Refill:  0    Return in about 6 months (around 08/31/2022).  I have spent a total time of 30-minutes on the day of the appointment including chart review, data review, collecting history, coordinating care and discussing medical diagnosis and plan with the patient/family. Past medical history, allergies, medications were reviewed. Pertinent imaging, labs and tests included in this note have been reviewed and interpreted independently by me.  [place], MD [facility] 03/02/2022 8:34 AM   Office Number [phone]

## 2022-03-02 NOTE — Patient Instructions (Signed)
  Mild persistent asthma - improved control Asthma exacerbation --CONTINUE Advair 230-21 mcg TWO puffs TWICE a day. This is your EVERYDAY inhaler  --CONTINUE Albuterol AS NEEDED for shortness of breath or wheezing. This is your RESCUE inhaler --Prednisone ordered  Follow-up with me in 6 months

## 2022-03-08 ENCOUNTER — Ambulatory Visit (INDEPENDENT_AMBULATORY_CARE_PROVIDER_SITE_OTHER): Payer: Managed Care, Other (non HMO) | Admitting: Gastroenterology

## 2022-03-08 ENCOUNTER — Other Ambulatory Visit: Payer: Managed Care, Other (non HMO)

## 2022-03-08 ENCOUNTER — Encounter: Payer: Self-pay | Admitting: Gastroenterology

## 2022-03-08 VITALS — BP 110/80 | HR 79 | Ht 68.0 in | Wt 340.2 lb

## 2022-03-08 DIAGNOSIS — R197 Diarrhea, unspecified: Secondary | ICD-10-CM | POA: Diagnosis not present

## 2022-03-08 DIAGNOSIS — R109 Unspecified abdominal pain: Secondary | ICD-10-CM | POA: Diagnosis not present

## 2022-03-08 MED ORDER — DICYCLOMINE HCL 10 MG PO CAPS: 10.0000 mg | ORAL_CAPSULE | Freq: Three times a day (TID) | ORAL | 0 refills | Status: DC

## 2022-03-08 NOTE — Progress Notes (Signed)
[facility] Consult Note:  History: Terri Cook 03/08/2022  Referring provider: Billie Ruddy, MD  Reason for consult/chief complaint: Diarrhea (Patient states that she has watery diarrhea when she has bowel movement and it takes about 10-15 minutes before having a bowel movement. ) and Abdominal Pain (Patient has abdominal pain and cramping throughout the day and worsens when patient needs to have a bowel movement.)  Chronic diarrhea Subjective  HPI:  This is a very pleasant 21 year old woman referred by primary care for recent onset diarrhea.  She had a cholecystectomy about 2 years ago, and since then would have occasional loose stool with certain foods.  However, starting about 3 weeks ago she developed frequent loose nonbloody stool with mid to lower abdominal cramps, all of which came on without clear trigger.  Limited health literacy, though there do not appear to have been any recent infectious exposures or antibiotic use.   Terri Cook saw primary care for this about a month ago and was advised to try Imodium. She also sees hematology for IDA from menorrhagia, receives iron infusions.  ROS:  Review of Systems  Constitutional:  Negative for appetite change and unexpected weight change.  HENT:  Negative for mouth sores and voice change.   Eyes:  Negative for pain and redness.  Respiratory:  Positive for cough. Negative for shortness of breath.   Cardiovascular:  Negative for chest pain and palpitations.  Genitourinary:  Negative for dysuria and hematuria.  Musculoskeletal:  Negative for arthralgias and myalgias.  Skin:  Negative for pallor and rash.  Allergic/Immunologic: Positive for environmental allergies.  Neurological:  Negative for weakness and headaches.  Hematological:  Negative for adenopathy.     Past Medical History: Past Medical History:  Diagnosis Date   Abnormal uterine bleeding    Acid reflux    Anemia    Asthma    Fatty liver     High blood pressure    High cholesterol    Ingrown left big toenail 09/2016   recurrent   PCOS (polycystic ovarian syndrome)    Pre-diabetes    Seasonal allergies      Past Surgical History: Past Surgical History:  Procedure Laterality Date   GALLBLADDER SURGERY  2021   LESION EXCISION Left 07/11/2014   Procedure: EXCISION OF BENIGN LESIONS INCLUDING MARGINS ;  Surgeon: Gerald Stabs, MD;  Location: [place];  Service: Pediatrics;  Laterality: Left;  mid back  and right neck   TOENAIL EXCISION  01/27/2012   Procedure: MINOR TOENAIL EXCISION;  Surgeon: Jerilynn Mages. Gerald Stabs, MD;  Location: [place];  Service: Pediatrics;  Laterality: Left;   TOENAIL EXCISION  03/23/2012   Procedure: MINOR TOENAIL EXCISION;  Surgeon: Jerilynn Mages. Gerald Stabs, MD;  Location: [place];  Service: Pediatrics;  Laterality: Right;   TOENAIL EXCISION Right 09/21/2012   Procedure: Partial Excision of Right Toenail;  Surgeon: Jerilynn Mages. Gerald Stabs, MD;  Location: [place];  Service: Pediatrics;  Laterality: Right;   TOENAIL EXCISION Left 02/15/2013   Procedure: PARTIAL TOENAIL EXCISION ON THE LEFT BIG TOE AT THE OUTER LATERAL FOLD WITH PHENOL (MP ROOM) ;  Surgeon: Jerilynn Mages. Gerald Stabs, MD;  Location: [place];  Service: Pediatrics;  Laterality: Left;   TOENAIL EXCISION Left 08/16/2013   Procedure: PARTIAL EXCISION OF LEFT BIG TOE NAIL FROM OUTER LATERAL FOLD;  Surgeon: Jerilynn Mages. Gerald Stabs, MD;  Location: [place];  Service: Pediatrics;  Laterality: Left;  TOENAIL EXCISION Left 09/16/2016   Procedure: PARTIAL PERMANENT EXCISION OF LEFT INGROWN BIG TOENAIL;  Surgeon: Gerald Stabs, MD;  Location: [place];  Service: General;  Laterality: Left;   WISDOM TOOTH EXTRACTION  12/08/2018   all 4     Family History: Family History  Problem Relation Age of Onset   Asthma Mother    Obesity Mother    Diabetes  Father    Hypertension Father    Asthma Father    High Cholesterol Father    Obesity Father    Cervical cancer Maternal Grandmother    Cirrhosis Paternal Grandmother        caused by medication   Diabetes Paternal Grandmother     Social History: Social History   Socioeconomic History   Marital status: Single    Spouse name: Not on file   Number of children: Not on file   Years of education: Not on file   Highest education level: Not on file  Occupational History   Not on file  Tobacco Use   Smoking status: Some Days    Packs/day: 0.25    Years: 2.00    Total pack years: 0.50    Types: Cigarettes   Smokeless tobacco: Never   Tobacco comments:    1-2 a day  Vaping Use   Vaping Use: Never used  Substance and Sexual Activity   Alcohol use: No   Drug use: No   Sexual activity: Not Currently    Birth control/protection: Abstinence, OCP  Other Topics Concern   Not on file  Social History Narrative   Not on file   Social Determinants of Health   Financial Resource Strain: Not on file  Food Insecurity: No Food Insecurity (01/02/2021)   Hunger Vital Sign    Worried About Running Out of Food in the Last Year: Never true    Ran Out of Food in the Last Year: Never true  Transportation Needs: Not on file  Physical Activity: Not on file  Stress: Not on file  Social Connections: Not on file    Allergies: Allergies  Allergen Reactions   Clindamycin/Lincomycin Anaphylaxis    Trouble breathing?    Outpatient Meds: Current Outpatient Medications  Medication Sig Dispense Refill   Acetaminophen-Caff-Pyrilamine (MIDOL MAX ST MENSTRUAL PO) Take 2 tablets by mouth 2 (two) times daily as needed (cramps).     ADVAIR HFA 230-21 MCG/ACT inhaler Inhale 2 puffs into the lungs 2 (two) times daily. 12 g 5   albuterol (VENTOLIN HFA) 108 (90 Base) MCG/ACT inhaler Inhale 2 puffs into the lungs every 6 (six) hours as needed for wheezing or shortness of breath. 8 g 6   Ascorbic Acid  (VITAMIN C PO) Take 1,000 mg by mouth daily.     aspirin-acetaminophen-caffeine (EXCEDRIN EXTRA STRENGTH) 250-250-65 MG tablet Take 2 tablets by mouth every 8 (eight) hours as needed for headache or migraine.     BALZIVA 0.4-35 MG-MCG tablet Take 1 tablet by mouth once daily 84 tablet 0   cetirizine (ZYRTEC) 10 MG tablet Take 10 mg by mouth daily as needed for allergies.     cholecalciferol (VITAMIN D3) 25 MCG (1000 UNIT) tablet Take 1,000 Units by mouth daily.     clobetasol cream (TEMOVATE) 9.52 % APPLY APPLICATION TOPICALLY TWICE DAILY TO AFFECTED AREAS OF SKIN 45 g 0   dicyclomine (BENTYL) 10 MG capsule Take 1 capsule (10 mg total) by mouth 3 (three) times daily before meals. 90 capsule 0  FeFum-FePoly-FA-B Cmp-C-Biot (INTEGRA PLUS) CAPS Take 1 capsule by mouth every morning. 30 capsule 2   ibuprofen (ADVIL,MOTRIN) 200 MG tablet Take 400 mg by mouth every 6 (six) hours as needed for mild pain or headache.     meloxicam (MOBIC) 15 MG tablet Take 1 tablet (15 mg total) by mouth daily. 30 tablet 0   predniSONE (DELTASONE) 10 MG tablet Take 4 tablets (40 mg total) by mouth daily with breakfast for 2 days, THEN 3 tablets (30 mg total) daily with breakfast for 2 days, THEN 2 tablets (20 mg total) daily with breakfast for 2 days, THEN 1 tablet (10 mg total) daily with breakfast for 2 days. 20 tablet 0   valACYclovir (VALTREX) 500 MG tablet Take one tablet po BID x 3 days with an outbreak 30 tablet 2   Vitamin D, Ergocalciferol, (DRISDOL) 1.25 MG (50000 UNIT) CAPS capsule Take 1 capsule (50,000 Units total) by mouth every 7 (seven) days. 5 capsule 0   zinc gluconate 50 MG tablet Take 50 mg by mouth daily.     No current facility-administered medications for this visit.      ___________________________________________________________________ Objective   Exam:  BP 110/80   Pulse 79   Ht '5\' 8"'$  (1.727 m)   Wt (!) 340 lb 4 oz (154.3 kg)   BMI 51.73 kg/m  Wt Readings from Last 3 Encounters:   03/08/22 (!) 340 lb 4 oz (154.3 kg)  03/02/22 (!) 340 lb 9.8 oz (154.5 kg)  02/04/22 (!) 340 lb 9.6 oz (154.5 kg)    General: Well-appearing, well-hydrated Eyes: sclera anicteric, no redness ENT: oral mucosa moist without lesions, no cervical or supraclavicular lymphadenopathy.  No mouth sores CV: Regular without murmur, no JVD, trace peripheral edema Resp: clear to auscultation bilaterally, normal RR and effort noted GI: soft, morbidly obese, no tenderness, with active bowel sounds.  No appreciable mass, limited by body habitus Skin; warm and dry, no rash or jaundice noted Neuro: awake, alert and oriented x 3. Normal gross motor function and fluent speech  Labs:     Latest Ref Rng & Units 01/21/2022    8:48 AM 12/02/2021    3:06 PM 10/22/2021   12:48 PM  CBC  WBC 4.0 - 10.5 K/uL 9.5  12.2  9.9   Hemoglobin 12.0 - 15.0 g/dL 15.1  13.9  14.4   Hematocrit 36.0 - 46.0 % 43.6  42.4  43.0   Platelets 150 - 400 K/uL 399  361.0  471    Iron/TIBC/Ferritin/ %Sat    Component Value Date/Time   IRON 52 01/21/2022 0848   IRON 41 09/30/2021 1150   TIBC 456 (H) 01/21/2022 0848   TIBC 498 (H) 09/30/2021 1150   FERRITIN 24 01/21/2022 0848   FERRITIN 14 (L) 09/30/2021 1150   IRONPCTSAT 11 01/21/2022 0848   IRONPCTSAT 8 (LL) 09/30/2021 1150     Assessment: Encounter Diagnoses  Name Primary?   Diarrhea of presumed infectious origin Yes   Abdominal cramps     About 3 weeks of cramps and diarrhea, possibly infectious or perhaps some postinfectious IBS-like symptoms.  Less likely celiac sprue or IBD given acuity of onset. No new meds or antibiotic use  Plan:  GI pathogen panel.  Dicyclomine 10 mg 3 times daily  Will contact her with results and proceed accordingly based on response to medicine.  Thank you for the courtesy of this consult.  Please call me with any questions or concerns.  Nelida Meuse III  CC: Referring provider noted above

## 2022-03-08 NOTE — Patient Instructions (Signed)
_______________________________________________________  If you are age 21 or older, your body mass index should be between 23-30. Your Body mass index is 51.73 kg/m. If this is out of the aforementioned range listed, please consider follow up with your Primary Care Provider.  If you are age 69 or younger, your body mass index should be between 19-25. Your Body mass index is 51.73 kg/m. If this is out of the aformentioned range listed, please consider follow up with your Primary Care Provider.   ________________________________________________________  The [facility] providers would like to encourage you to use [facility] to communicate with providers for non-urgent requests or questions.  Due to long hold times on the telephone, sending your provider a message by [facility] may be a faster and more efficient way to get a response.  Please allow 48 business hours for a response.  Please remember that this is for non-urgent requests.  _______________________________________________________  Your provider has requested that you go to the basement level for lab work before leaving today. Press "B" on the elevator. The lab is located at the first door on the left as you exit the elevator.  Due to recent changes in healthcare laws, you may see the results of your imaging and laboratory studies on MyChart before your provider has had a chance to review them.  We understand that in some cases there may be results that are confusing or concerning to you. Not all laboratory results come back in the same time frame and the provider may be waiting for multiple results in order to interpret others.  Please give [place] 48 hours in order for your provider to thoroughly review all the results before contacting the office for clarification of your results.   It was a pleasure to see you today!  Thank you for trusting me with your gastrointestinal care!

## 2022-03-09 ENCOUNTER — Other Ambulatory Visit: Payer: Managed Care, Other (non HMO)

## 2022-03-09 DIAGNOSIS — R197 Diarrhea, unspecified: Secondary | ICD-10-CM

## 2022-03-10 LAB — FECAL LACTOFERRIN, QUANT
Fecal Lactoferrin: NEGATIVE
MICRO NUMBER:: 14272389
SPECIMEN QUALITY:: ADEQUATE

## 2022-03-12 ENCOUNTER — Telehealth: Payer: Self-pay | Admitting: Gastroenterology

## 2022-03-12 NOTE — Telephone Encounter (Signed)
Pt returned call. I informed her that we only have the fecal lactoferrin test back and that was normal. I advised pt that we will be in touch with further recommendations once we have the remainder of her results. Pt has been advised to continue Dicyclomine TID in the interim. Pt verbalized understanding and had no concerns at the end of the call.

## 2022-03-12 NOTE — Telephone Encounter (Signed)
Lm on vm for patient to return call 

## 2022-03-12 NOTE — Telephone Encounter (Signed)
Inbound call from patient wanting to discuss stool sample results and wants to know what her next steps are. Please advise.

## 2022-03-13 LAB — GI PROFILE, STOOL, PCR

## 2022-03-15 NOTE — Progress Notes (Deleted)
21 y.o. G0P0000 Single White or Caucasian Not Hispanic or Latino female here for annual exam.      No LMP recorded.          Sexually active: {yes no:314532}  The current method of family planning is {contraception:315051}.    Exercising: {yes no:314532}  {types:19826} Smoker:  {YES NO:22349}  Health Maintenance: Pap:  never  History of abnormal Pap:  no MMG:  n/a BMD:   n/a Colonoscopy: never  TDaP:  09/07/13 Gardasil: x2   reports that she has been smoking cigarettes. She has a 0.50 pack-year smoking history. She has never used smokeless tobacco. She reports that she does not drink alcohol and does not use drugs.  Past Medical History:  Diagnosis Date   Abnormal uterine bleeding    Acid reflux    Anemia    Asthma    Fatty liver    High blood pressure    High cholesterol    Ingrown left big toenail 09/2016   recurrent   PCOS (polycystic ovarian syndrome)    Pre-diabetes    Seasonal allergies     Past Surgical History:  Procedure Laterality Date   GALLBLADDER SURGERY  2021   LESION EXCISION Left 07/11/2014   Procedure: EXCISION OF BENIGN LESIONS INCLUDING MARGINS ;  Surgeon: Gerald Stabs, MD;  Location: [place];  Service: Pediatrics;  Laterality: Left;  mid back  and right neck   TOENAIL EXCISION  01/27/2012   Procedure: MINOR TOENAIL EXCISION;  Surgeon: Jerilynn Mages. Gerald Stabs, MD;  Location: [place];  Service: Pediatrics;  Laterality: Left;   TOENAIL EXCISION  03/23/2012   Procedure: MINOR TOENAIL EXCISION;  Surgeon: Jerilynn Mages. Gerald Stabs, MD;  Location: [place];  Service: Pediatrics;  Laterality: Right;   TOENAIL EXCISION Right 09/21/2012   Procedure: Partial Excision of Right Toenail;  Surgeon: Jerilynn Mages. Gerald Stabs, MD;  Location: [place];  Service: Pediatrics;  Laterality: Right;   TOENAIL EXCISION Left 02/15/2013   Procedure: PARTIAL TOENAIL EXCISION ON THE LEFT BIG TOE AT THE OUTER LATERAL FOLD WITH  PHENOL (MP ROOM) ;  Surgeon: Jerilynn Mages. Gerald Stabs, MD;  Location: [place];  Service: Pediatrics;  Laterality: Left;   TOENAIL EXCISION Left 08/16/2013   Procedure: PARTIAL EXCISION OF LEFT BIG TOE NAIL FROM OUTER LATERAL FOLD;  Surgeon: Jerilynn Mages. Gerald Stabs, MD;  Location: [place];  Service: Pediatrics;  Laterality: Left;   TOENAIL EXCISION Left 09/16/2016   Procedure: PARTIAL PERMANENT EXCISION OF LEFT INGROWN BIG TOENAIL;  Surgeon: Gerald Stabs, MD;  Location: [place];  Service: General;  Laterality: Left;   WISDOM TOOTH EXTRACTION  12/08/2018   all 4    Current Outpatient Medications  Medication Sig Dispense Refill   Acetaminophen-Caff-Pyrilamine (MIDOL MAX ST MENSTRUAL PO) Take 2 tablets by mouth 2 (two) times daily as needed (cramps).     ADVAIR HFA 230-21 MCG/ACT inhaler Inhale 2 puffs into the lungs 2 (two) times daily. 12 g 5   albuterol (VENTOLIN HFA) 108 (90 Base) MCG/ACT inhaler Inhale 2 puffs into the lungs every 6 (six) hours as needed for wheezing or shortness of breath. 8 g 6   Ascorbic Acid (VITAMIN C PO) Take 1,000 mg by mouth daily.     aspirin-acetaminophen-caffeine (EXCEDRIN EXTRA STRENGTH) 250-250-65 MG tablet Take 2 tablets by mouth every 8 (eight) hours as needed for headache or migraine.     BALZIVA 0.4-35 MG-MCG tablet Take 1 tablet  by mouth once daily 84 tablet 0   cetirizine (ZYRTEC) 10 MG tablet Take 10 mg by mouth daily as needed for allergies.     cholecalciferol (VITAMIN D3) 25 MCG (1000 UNIT) tablet Take 1,000 Units by mouth daily.     clobetasol cream (TEMOVATE) 1.74 % APPLY APPLICATION TOPICALLY TWICE DAILY TO AFFECTED AREAS OF SKIN 45 g 0   dicyclomine (BENTYL) 10 MG capsule Take 1 capsule (10 mg total) by mouth 3 (three) times daily before meals. 90 capsule 0   FeFum-FePoly-FA-B Cmp-C-Biot (INTEGRA PLUS) CAPS Take 1 capsule by mouth every morning. 30 capsule 2   ibuprofen (ADVIL,MOTRIN) 200 MG tablet Take  400 mg by mouth every 6 (six) hours as needed for mild pain or headache.     meloxicam (MOBIC) 15 MG tablet Take 1 tablet (15 mg total) by mouth daily. 30 tablet 0   valACYclovir (VALTREX) 500 MG tablet Take one tablet po BID x 3 days with an outbreak 30 tablet 2   Vitamin D, Ergocalciferol, (DRISDOL) 1.25 MG (50000 UNIT) CAPS capsule Take 1 capsule (50,000 Units total) by mouth every 7 (seven) days. 5 capsule 0   zinc gluconate 50 MG tablet Take 50 mg by mouth daily.     No current facility-administered medications for this visit.    Family History  Problem Relation Age of Onset   Asthma Mother    Obesity Mother    Diabetes Father    Hypertension Father    Asthma Father    High Cholesterol Father    Obesity Father    Cervical cancer Maternal Grandmother    Cirrhosis Paternal Grandmother        caused by medication   Diabetes Paternal Grandmother     Review of Systems  Exam:   There were no vitals taken for this visit.  Weight change: '@WEIGHTCHANGE'$ @ Height:      Ht Readings from Last 3 Encounters:  03/08/22 '5\' 8"'$  (1.727 m)  03/02/22 '5\' 8"'$  (1.727 m)  01/21/22 '5\' 8"'$  (1.727 m)    General appearance: alert, cooperative and appears stated age Head: Normocephalic, without obvious abnormality, atraumatic Neck: no adenopathy, supple, symmetrical, trachea midline and thyroid {CHL AMB PHY EX THYROID NORM [phone]::"normal to inspection and palpation"} Lungs: clear to auscultation bilaterally Cardiovascular: regular rate and rhythm Breasts: {Exam; breast:13139::"normal appearance, no masses or tenderness"} Abdomen: soft, non-tender; non distended,  no masses,  no organomegaly Extremities: extremities normal, atraumatic, no cyanosis or edema Skin: Skin color, texture, turgor normal. No rashes or lesions Lymph nodes: Cervical, supraclavicular, and axillary nodes normal. No abnormal inguinal nodes palpated Neurologic: Grossly normal   Pelvic: External genitalia:  no  lesions              Urethra:  normal appearing urethra with no masses, tenderness or lesions              Bartholins and Skenes: normal                 Vagina: normal appearing vagina with normal color and discharge, no lesions              Cervix: {CHL AMB PHY EX CERVIX NORM [phone]::"no lesions"}               Bimanual Exam:  Uterus:  {CHL AMB PHY EX UTERUS NORM DEFAULT:[phone]::"normal size, contour, position, consistency, mobility, non-tender"}              Adnexa: {CHL AMB PHY EX ADNEXA NO  MASS DEFAULT:[phone]::"no mass, fullness, tenderness"}               Rectovaginal: Confirms               Anus:  normal sphincter tone, no lesions  *** chaperoned for the exam.  A:  Well Woman with normal exam  P:

## 2022-03-24 ENCOUNTER — Ambulatory Visit: Payer: Managed Care, Other (non HMO) | Admitting: Obstetrics and Gynecology

## 2022-03-26 ENCOUNTER — Ambulatory Visit (INDEPENDENT_AMBULATORY_CARE_PROVIDER_SITE_OTHER): Payer: Managed Care, Other (non HMO) | Admitting: Family Medicine

## 2022-03-26 ENCOUNTER — Encounter: Payer: Self-pay | Admitting: Family Medicine

## 2022-03-26 VITALS — BP 118/74 | HR 86 | Temp 98.1°F | Wt 341.0 lb

## 2022-03-26 DIAGNOSIS — H6121 Impacted cerumen, right ear: Secondary | ICD-10-CM

## 2022-03-26 DIAGNOSIS — H6991 Unspecified Eustachian tube disorder, right ear: Secondary | ICD-10-CM | POA: Diagnosis not present

## 2022-03-26 DIAGNOSIS — H9201 Otalgia, right ear: Secondary | ICD-10-CM

## 2022-03-26 DIAGNOSIS — L299 Pruritus, unspecified: Secondary | ICD-10-CM | POA: Diagnosis not present

## 2022-03-26 NOTE — Progress Notes (Signed)
   Acute Office Visit  Subjective:     Patient ID: Terri Cook, female    DOB: 2000/05/11, 21 y.o.   MRN: [id number]  Chief Complaint  Patient presents with   Otalgia    Pt c/o R ear pain x 3 days; denies discharge     HPI Patient is in today for acute concern.  Patient endorses right ear pain x 3 days.  Also notes a popping sensation.  Denies sore throat, facial pain/pressure, headache, rhinorrhea, cough.  ROS Positive right ear pain and popping     Objective:    BP 118/74   Pulse 86   Temp 98.1 F (36.7 C) (Oral)   Wt (!) 341 lb (154.7 kg)   SpO2 98%   BMI 51.85 kg/m    Physical Exam Constitutional:      Appearance: Normal appearance. She is obese.  HENT:     Head: Normocephalic and atraumatic.     Right Ear: External ear normal. There is impacted cerumen.     Left Ear: Tympanic membrane, ear canal and external ear normal.     Ears:     Comments: Curette used to remove small amount of cerumen from right ear canal.  Firm cerumen remained.  Right ear irrigated.  R TM full. Eyes:     Extraocular Movements: Extraocular movements intact.     Conjunctiva/sclera: Conjunctivae normal.     Pupils: Pupils are equal, round, and reactive to light.  Cardiovascular:     Rate and Rhythm: Normal rate and regular rhythm.     Heart sounds: Normal heart sounds.  Pulmonary:     Effort: Pulmonary effort is normal.     Breath sounds: Normal breath sounds. No wheezing, rhonchi or rales.  Musculoskeletal:        General: Normal range of motion.  Skin:    General: Skin is warm and dry.  Neurological:     Mental Status: She is alert.     No results found for any visits on 03/26/22.      Assessment & Plan:   Problem List Items Addressed This Visit   None Visit Diagnoses     Impacted cerumen of right ear    -  Primary   Right ear pain       Ear itching       Dysfunction of right eustachian tube          Consent obtained.  Curette used to remove small amount of wax  from right canal.  Firm wax remained.  Right ear irrigated.  Start OTC antihistamine for eustachian tube dysfunction and ear pruritus.  Consider OTC Debrox eardrops.  Return if symptoms worsen or fail to improve.  Billie Ruddy, MD

## 2022-03-26 NOTE — Patient Instructions (Addendum)
An over-the-counter antihistamine such as Allegra, Zyrtec, Claritin, Xyzal, etc. to help with the popping sensation and discomfort in your ear.  Currently there is no infection in your ear.  Allergies and wax buildup can cause itching/irritated feeling and popping sensation in your ears.  You can use over-the-counter Debrox eardrops to help with wax buildup.

## 2022-03-31 ENCOUNTER — Telehealth: Payer: Self-pay | Admitting: *Deleted

## 2022-03-31 NOTE — Telephone Encounter (Signed)
Yes-ED if she is soaking through that fast

## 2022-03-31 NOTE — Telephone Encounter (Signed)
Dr.Jertson patient) called c/o vaginal bleeding with BCP, patient reports her cycle started end of Nov and has bled since then. Started spotting, now heavy bleeding wearing overnight pad changing every 45 minutes for whole month of December. Takes BCP pills daily on time, no missed pills, she is in the 3 week of pill pack, due to start placebo pills next week, not sexually active. Report to ER? Please advise recommendations.

## 2022-03-31 NOTE — Telephone Encounter (Signed)
Patient informed to report to ER.

## 2022-04-02 ENCOUNTER — Encounter: Payer: Self-pay | Admitting: Physician Assistant

## 2022-04-03 ENCOUNTER — Other Ambulatory Visit: Payer: Self-pay | Admitting: Obstetrics and Gynecology

## 2022-04-03 DIAGNOSIS — Z3041 Encounter for surveillance of contraceptive pills: Secondary | ICD-10-CM

## 2022-04-22 ENCOUNTER — Other Ambulatory Visit (INDEPENDENT_AMBULATORY_CARE_PROVIDER_SITE_OTHER): Payer: Managed Care, Other (non HMO)

## 2022-04-22 ENCOUNTER — Telehealth: Payer: Self-pay | Admitting: Gastroenterology

## 2022-04-22 ENCOUNTER — Encounter: Payer: Self-pay | Admitting: Gastroenterology

## 2022-04-22 ENCOUNTER — Ambulatory Visit (INDEPENDENT_AMBULATORY_CARE_PROVIDER_SITE_OTHER): Payer: Managed Care, Other (non HMO) | Admitting: Gastroenterology

## 2022-04-22 ENCOUNTER — Encounter: Payer: Self-pay | Admitting: Physician Assistant

## 2022-04-22 ENCOUNTER — Other Ambulatory Visit (HOSPITAL_COMMUNITY): Payer: Self-pay

## 2022-04-22 VITALS — BP 118/72 | HR 98 | Ht 68.0 in | Wt 338.1 lb

## 2022-04-22 DIAGNOSIS — K529 Noninfective gastroenteritis and colitis, unspecified: Secondary | ICD-10-CM

## 2022-04-22 DIAGNOSIS — E739 Lactose intolerance, unspecified: Secondary | ICD-10-CM | POA: Diagnosis not present

## 2022-04-22 LAB — HIGH SENSITIVITY CRP: CRP, High Sensitivity: 7.2 mg/L — ABNORMAL HIGH (ref 0.000–5.000)

## 2022-04-22 LAB — SEDIMENTATION RATE: Sed Rate: 13 mm/hr (ref 0–20)

## 2022-04-22 MED ORDER — COLESEVELAM HCL 625 MG PO TABS: 1250.0000 mg | ORAL_TABLET | Freq: Two times a day (BID) | ORAL | 1 refills | Status: DC

## 2022-04-22 NOTE — Patient Instructions (Addendum)
_______________________________________________________  If your blood pressure at your visit was 140/90 or greater, please contact your primary care physician to follow up on this.  _______________________________________________________  If you are age 22 or older, your body mass index should be between 23-30. Your Body mass index is 51.41 kg/m. If this is out of the aforementioned range listed, please consider follow up with your Primary Care Provider.  If you are age 33 or younger, your body mass index should be between 19-25. Your Body mass index is 51.41 kg/m. If this is out of the aformentioned range listed, please consider follow up with your Primary Care Provider.   ________________________________________________________  The [place] [facility] providers would like to encourage you to use [facility] to communicate with providers for non-urgent requests or questions.  Due to long hold times on the telephone, sending your provider a message by [facility] may be a faster and more efficient way to get a response.  Please allow 48 business hours for a response.  Please remember that this is for non-urgent requests.  _______________________________________________________   Discontinue Bentyl  Follow up in 4-6 weeks    Your provider has requested that you go to the basement level for lab work before leaving today. Press "B" on the elevator. The lab is located at the first door on the left as you exit the elevator.   Due to recent changes in healthcare laws, you may see the results of your imaging and laboratory studies on MyChart before your provider has had a chance to review them.  We understand that in some cases there may be results that are confusing or concerning to you. Not all laboratory results come back in the same time frame and the provider may be waiting for multiple results in order to interpret others.  Please give [place] 48 hours in order for your provider to thoroughly review all the  results before contacting the office for clarification of your results.    It was a pleasure to see you today!  Thank you for trusting me with your gastrointestinal care!

## 2022-04-22 NOTE — Telephone Encounter (Signed)
Pt has two insurances. PA not needed. Pt plan requires a 3 month supply for all future fills.

## 2022-04-22 NOTE — Telephone Encounter (Signed)
Patient called, stating [facility] Medication prescribed today by Dr. Loletha Carrow needs prior authorization according to her pharmacy. Please advise.

## 2022-04-22 NOTE — Progress Notes (Signed)
[facility] Progress Note  Chief Complaint: Chronic diarrhea  Subjective  History:  Terri Cook was seen in consultation last month for about 3 weeks of persistent diarrhea with no clear trigger.  She had pre-existing occasional diarrhea since cholecystectomy a few years ago. GI pathogen panel ordered and dicyclomine prescribed.  Pathogen panel negative for multiple organisms including C. difficile and also negative lactoferrin.  Venna carefully read the diet suggestions I gave her at the last visit, stayed off dairy for a week and realized that she is lactose intolerant.  Even completed off lactose she did not have complete relief of symptoms.  She has been taking 1-3 lactase enzymes with each serving of dairy.  No rectal bleeding, still has abdominal bloating and intermittent cramps, and did not find dicyclomine 10 mg 3 times daily daily particularly helpful. ROS: Cardiovascular:  no chest pain Respiratory: no dyspnea  The patient's Past Medical, Family and Social History were reviewed and are on file in the EMR.  Objective:  Med list reviewed  Current Outpatient Medications:    Acetaminophen-Caff-Pyrilamine (MIDOL MAX ST MENSTRUAL PO), Take 2 tablets by mouth 2 (two) times daily as needed (cramps)., Disp: , Rfl:    ADVAIR HFA 230-21 MCG/ACT inhaler, Inhale 2 puffs into the lungs 2 (two) times daily., Disp: 12 g, Rfl: 5   albuterol (VENTOLIN HFA) 108 (90 Base) MCG/ACT inhaler, Inhale 2 puffs into the lungs every 6 (six) hours as needed for wheezing or shortness of breath., Disp: 8 g, Rfl: 6   Ascorbic Acid (VITAMIN C PO), Take 1,000 mg by mouth daily., Disp: , Rfl:    aspirin-acetaminophen-caffeine (EXCEDRIN EXTRA STRENGTH) 250-250-65 MG tablet, Take 2 tablets by mouth every 8 (eight) hours as needed for headache or migraine., Disp: , Rfl:    BALZIVA 0.4-35 MG-MCG tablet, Take 1 tablet by mouth once daily, Disp: 84 tablet, Rfl: 0   cetirizine (ZYRTEC) 10 MG tablet, Take 10 mg by  mouth daily as needed for allergies., Disp: , Rfl:    cholecalciferol (VITAMIN D3) 25 MCG (1000 UNIT) tablet, Take 1,000 Units by mouth daily., Disp: , Rfl:    dicyclomine (BENTYL) 10 MG capsule, Take 1 capsule (10 mg total) by mouth 3 (three) times daily before meals., Disp: 90 capsule, Rfl: 0   FeFum-FePoly-FA-B Cmp-C-Biot (INTEGRA PLUS) CAPS, Take 1 capsule by mouth every morning., Disp: 30 capsule, Rfl: 2   ibuprofen (ADVIL,MOTRIN) 200 MG tablet, Take 400 mg by mouth every 6 (six) hours as needed for mild pain or headache., Disp: , Rfl:    valACYclovir (VALTREX) 500 MG tablet, Take one tablet po BID x 3 days with an outbreak, Disp: 30 tablet, Rfl: 2   Vitamin D, Ergocalciferol, (DRISDOL) 1.25 MG (50000 UNIT) CAPS capsule, Take 1 capsule (50,000 Units total) by mouth every 7 (seven) days., Disp: 5 capsule, Rfl: 0   zinc gluconate 50 MG tablet, Take 50 mg by mouth daily., Disp: , Rfl:    lactase (LACTAID) 3000 units tablet, Take by mouth 3 (three) times daily with meals., Disp: , Rfl:    Vital signs in last 24 hrs: Vitals:   04/22/22 1416  BP: 118/72  Pulse: 98  SpO2: 99%   Wt Readings from Last 3 Encounters:  04/22/22 (!) 338 lb 2 oz (153.4 kg)  03/26/22 (!) 341 lb (154.7 kg)  03/08/22 (!) 340 lb 4 oz (154.3 kg)    Physical Exam  Very pleasant and well-appearing Cardiac: Regular without appreciable murmur,  no  peripheral edema Pulm: clear to auscultation bilaterally, normal RR and effort noted Abdomen: soft, no tenderness, with active bowel sounds.  Cannot assess mass or hepatosplenomegaly due to abdominal girth  Labs:   ___________________________________________ Radiologic studies:   ____________________________________________ Other:   _____________________________________________ Assessment & Plan  Assessment: Encounter Diagnoses  Name Primary?   Chronic diarrhea Yes   Lactose intolerance    Chronic diarrhea, negative infectious workup, negative lactoferrin  decreasing chance of inflammatory bowel disease. She is lactose intolerance, partially accounting for her symptoms. It sounds like she is also likely consuming high-fat foods at times which may be contributing to symptoms since she had previous cholecystectomy.  We also discussed the possibility of bile acid diarrhea and when that occurs.   Plan:  Labs today: Celiac antibodies, ESR, CRP  Stop dicyclomine.  Trial of WelChol 625 mg, 2 tablets twice daily with breakfast and supper. I explained the importance of taking that medicine 4 hours away from other medicines, especially her oral contraceptive.  Therefore, she decided that she will most likely take the WelChol with breakfast and late afternoon, taking her other medicines including OCP midday or bedtime.  Return to office 4 to 6 weeks.  25 minutes were spent on this encounter (including chart review, history/exam, counseling/coordination of care, and documentation) > 50% of that time was spent on counseling and coordination of care.   Terri Cook

## 2022-04-23 ENCOUNTER — Telehealth: Payer: Self-pay | Admitting: Gastroenterology

## 2022-04-23 LAB — TISSUE TRANSGLUTAMINASE, IGA: (tTG) Ab, IgA: <1 U/mL

## 2022-04-23 LAB — IGA: Immunoglobulin A: 177 mg/dL (ref 47–310)

## 2022-04-23 NOTE — Telephone Encounter (Signed)
Called and spoke with patient. I informed patient that results have not been reviewed by MD yet and he is out of the office today. Pt has been advised to look out for MyChart message or a call with results. Pt verbalized understanding and had no concerns at the end of the call.

## 2022-04-23 NOTE — Telephone Encounter (Signed)
Patient called to go over lab results with someone.

## 2022-04-26 ENCOUNTER — Telehealth: Payer: Self-pay | Admitting: Gastroenterology

## 2022-04-26 NOTE — Telephone Encounter (Signed)
Patient calling about test results She googled and saw results are cause of infection. Very concerned Please advise.

## 2022-04-26 NOTE — Telephone Encounter (Signed)
Thank you for the note.  I understand her concern, though I would caution against the web search on test results, especially before the physician has had a chance to review them.  Her concern is a mildly elevated CRP with a normal ESR.  These are nonspecific measures of possible inflammation.  I was only getting them because it may affect our clinical suspicion of whether she has certain conditions that may be contributing to the diarrhea.  We can discuss it more when I see her next, but I do not believe she has an infection at present.  She had a negative stool infectious study when I first saw her.  Incidentally, the celiac/gluten intolerance test were negative.  Please tell her to try not to worry about this test result, it may be of no clinical significance, I would like her to try the new medicine prescribed and I will see her at follow-up as scheduled.  - HD

## 2022-04-26 NOTE — Telephone Encounter (Signed)
Called and spoke with patient regarding her lab results and recommendations. Pt states that she has not been able to pick up RX for [facility] because it needs a PA. I informed patient that we will forward this information to our PA team to work on. I advised pt to call back on Friday if she has not been able to pick up her medication. Otherwise, pt has been advised to keep her f/u as scheduled with Dr. Loletha Carrow. Pt verbalized understanding and had no concerns at the end of the call.   [place], or PA team - Please initiate PA for Welchol 625 mg tablet - Take 2 tablets (1,250 mg total) by mouth 2 (two) times daily with a meal. Thanks

## 2022-04-26 NOTE — Telephone Encounter (Signed)
Dr. Loletha Carrow, pt called last week as well. I informed her that results had not been reviewed yet. Thanks

## 2022-05-03 ENCOUNTER — Other Ambulatory Visit (HOSPITAL_COMMUNITY): Payer: Self-pay

## 2022-05-03 NOTE — Telephone Encounter (Signed)
Good morning, is anyone able to follow up on this PA request? Thanks

## 2022-05-03 NOTE — Telephone Encounter (Signed)
PA not needed. Verified with pharmacy [address])Pt filled at pharmacy on 1.24.24 and picked up with a $10 copay.

## 2022-05-03 NOTE — Telephone Encounter (Signed)
Noted, thanks!

## 2022-05-04 NOTE — Progress Notes (Signed)
22 y.o. G0P0000 Single White or Caucasian Not Hispanic or Latino female here for annual exam.  She would like to talk about pregnancy, considering getting pregnant in about a year. She has been with her partner x 3 months, friends for 3 years. Cycles q month x 4 days. Saturates a pad in 4 hours. No BTB. Tolerable cramps.  Not using condoms.    H/O HSV, no recent out breaks.   Patient's last menstrual period was 04/27/2022.          Sexually active: Yes.    The current method of family planning is OCP (estrogen/progesterone).    Exercising: Yes.     Walking  Smoker:  yes 4-5 cigarettes a day   Health Maintenance: Pap:  never  History of abnormal Pap:  n/a MMG:  n/a BMD:   n/a Colonoscopy: n/a TDaP:  09/07/13 Gardasil: complete    reports that she has been smoking cigarettes. She has a 0.50 pack-year smoking history. She has never used smokeless tobacco. She reports that she does not drink alcohol and does not use drugs. She lives with her family. Not currently working.   Past Medical History:  Diagnosis Date   Abnormal uterine bleeding    Acid reflux    Anemia    Asthma    Fatty liver    High blood pressure    High cholesterol    Ingrown left big toenail 09/2016   recurrent   PCOS (polycystic ovarian syndrome)    Pre-diabetes    Seasonal allergies     Past Surgical History:  Procedure Laterality Date   GALLBLADDER SURGERY  2021   LESION EXCISION Left 07/11/2014   Procedure: EXCISION OF BENIGN LESIONS INCLUDING MARGINS ;  Surgeon: Gerald Stabs, MD;  Location: [place];  Service: Pediatrics;  Laterality: Left;  mid back  and right neck   TOENAIL EXCISION  01/27/2012   Procedure: MINOR TOENAIL EXCISION;  Surgeon: Jerilynn Mages. Gerald Stabs, MD;  Location: [place];  Service: Pediatrics;  Laterality: Left;   TOENAIL EXCISION  03/23/2012   Procedure: MINOR TOENAIL EXCISION;  Surgeon: Jerilynn Mages. Gerald Stabs, MD;  Location: [place];   Service: Pediatrics;  Laterality: Right;   TOENAIL EXCISION Right 09/21/2012   Procedure: Partial Excision of Right Toenail;  Surgeon: Jerilynn Mages. Gerald Stabs, MD;  Location: [place];  Service: Pediatrics;  Laterality: Right;   TOENAIL EXCISION Left 02/15/2013   Procedure: PARTIAL TOENAIL EXCISION ON THE LEFT BIG TOE AT THE OUTER LATERAL FOLD WITH PHENOL (MP ROOM) ;  Surgeon: Jerilynn Mages. Gerald Stabs, MD;  Location: [place];  Service: Pediatrics;  Laterality: Left;   TOENAIL EXCISION Left 08/16/2013   Procedure: PARTIAL EXCISION OF LEFT BIG TOE NAIL FROM OUTER LATERAL FOLD;  Surgeon: Jerilynn Mages. Gerald Stabs, MD;  Location: [place];  Service: Pediatrics;  Laterality: Left;   TOENAIL EXCISION Left 09/16/2016   Procedure: PARTIAL PERMANENT EXCISION OF LEFT INGROWN BIG TOENAIL;  Surgeon: Gerald Stabs, MD;  Location: [place];  Service: General;  Laterality: Left;   WISDOM TOOTH EXTRACTION  12/08/2018   all 4    Current Outpatient Medications  Medication Sig Dispense Refill   Acetaminophen-Caff-Pyrilamine (MIDOL MAX ST MENSTRUAL PO) Take 2 tablets by mouth 2 (two) times daily as needed (cramps).     ADVAIR HFA 230-21 MCG/ACT inhaler Inhale 2 puffs into the lungs 2 (two) times daily. 12 g 5   albuterol (VENTOLIN HFA) 108 (  90 Base) MCG/ACT inhaler Inhale 2 puffs into the lungs every 6 (six) hours as needed for wheezing or shortness of breath. 8 g 6   Ascorbic Acid (VITAMIN C PO) Take 1,000 mg by mouth daily.     aspirin-acetaminophen-caffeine (EXCEDRIN EXTRA STRENGTH) 250-250-65 MG tablet Take 2 tablets by mouth every 8 (eight) hours as needed for headache or migraine.     BALZIVA 0.4-35 MG-MCG tablet Take 1 tablet by mouth once daily 84 tablet 0   cetirizine (ZYRTEC) 10 MG tablet Take 10 mg by mouth daily as needed for allergies.     cholecalciferol (VITAMIN D3) 25 MCG (1000 UNIT) tablet Take 1,000 Units by mouth daily.     colesevelam  (WELCHOL) 625 MG tablet Take 2 tablets (1,250 mg total) by mouth 2 (two) times daily with a meal. Do not take within 4 hours of other medicines 120 tablet 1   FeFum-FePoly-FA-B Cmp-C-Biot (INTEGRA PLUS) CAPS Take 1 capsule by mouth every morning. 30 capsule 2   ibuprofen (ADVIL,MOTRIN) 200 MG tablet Take 400 mg by mouth every 6 (six) hours as needed for mild pain or headache.     lactase (LACTAID) 3000 units tablet Take by mouth 3 (three) times daily with meals.     valACYclovir (VALTREX) 500 MG tablet Take one tablet po BID x 3 days with an outbreak 30 tablet 2   Vitamin D, Ergocalciferol, (DRISDOL) 1.25 MG (50000 UNIT) CAPS capsule Take 1 capsule (50,000 Units total) by mouth every 7 (seven) days. 5 capsule 0   zinc gluconate 50 MG tablet Take 50 mg by mouth daily.     No current facility-administered medications for this visit.    Family History  Problem Relation Age of Onset   Asthma Mother    Obesity Mother    Diabetes Father    Hypertension Father    Asthma Father    High Cholesterol Father    Obesity Father    Cervical cancer Maternal Grandmother    Cirrhosis Paternal Grandmother        caused by medication   Diabetes Paternal Grandmother     Review of Systems  All other systems reviewed and are negative.   Exam:   BP 130/82   Pulse 99   Ht 5' 8"$  (1.727 m)   Wt (!) 336 lb (152.4 kg)   LMP 04/27/2022   SpO2 100%   BMI 51.09 kg/m   Weight change: @WEIGHTCHANGE$ @ Height:   Height: 5' 8"$  (172.7 cm)  Ht Readings from Last 3 Encounters:  05/14/22 5' 8"$  (1.727 m)  04/22/22 5' 8"$  (1.727 m)  03/08/22 5' 8"$  (1.727 m)    General appearance: alert, cooperative and appears stated age Head: Normocephalic, without obvious abnormality, atraumatic Neck: no adenopathy, supple, symmetrical, trachea midline and thyroid normal to inspection and palpation Lungs: clear to auscultation bilaterally Cardiovascular: regular rate and rhythm Breasts: normal appearance, no masses or  tenderness Abdomen: soft, non-tender; non distended,  no masses,  no organomegaly Extremities: extremities normal, atraumatic, no cyanosis or edema Skin: Skin color, texture, turgor normal. No rashes or lesions Lymph nodes: Cervical, supraclavicular, and axillary nodes normal. No abnormal inguinal nodes palpated Neurologic: Grossly normal   Pelvic: External genitalia:  no lesions              Urethra:  normal appearing urethra with no masses, tenderness or lesions              Bartholins and Skenes: normal  Vagina: normal appearing vagina with normal color and discharge, no lesions              Cervix: no lesions               Bimanual Exam:  Uterus:   anteverted, mobile, not appreciably enlarged, not tender              Adnexa: no mass, fullness, tenderness               Rectovaginal: Confirms               Anus:  normal sphincter tone, no lesions  Santiago Glad, CMA chaperoned for the exam.  1. Well woman exam Discussed breast self exam Discussed calcium and vit D intake Labs with primary  2. Encounter for surveillance of contraceptive pills - BALZIVA 0.4-35 MG-MCG tablet; Take 1 tablet by mouth daily.  Dispense: 84 tablet; Refill: 3 -discussed good health prior to pregnancy  3. Screening for cervical cancer - Cytology - PAP  4. Screening examination for STD (sexually transmitted disease) - Cytology - PAP - RPR - HIV Antibody (routine testing w rflx) - Hepatitis C antibody  5. History of herpes genitalis - valACYclovir (VALTREX) 500 MG tablet; Take one tablet po BID x 3 days with an outbreak  Dispense: 30 tablet; Refill: 2

## 2022-05-11 ENCOUNTER — Other Ambulatory Visit (HOSPITAL_COMMUNITY): Payer: Self-pay

## 2022-05-14 ENCOUNTER — Ambulatory Visit (INDEPENDENT_AMBULATORY_CARE_PROVIDER_SITE_OTHER): Payer: Managed Care, Other (non HMO) | Admitting: Obstetrics and Gynecology

## 2022-05-14 ENCOUNTER — Other Ambulatory Visit (HOSPITAL_COMMUNITY)
Admission: RE | Admit: 2022-05-14 | Discharge: 2022-05-14 | Disposition: A | Discharge status: Home / Self Care | Payer: Managed Care, Other (non HMO) | Source: Ambulatory Visit | Attending: Obstetrics and Gynecology | Admitting: Obstetrics and Gynecology

## 2022-05-14 ENCOUNTER — Encounter: Payer: Self-pay | Admitting: Obstetrics and Gynecology

## 2022-05-14 VITALS — BP 130/82 | HR 99 | Ht 68.0 in | Wt 336.0 lb

## 2022-05-14 DIAGNOSIS — Z113 Encounter for screening for infections with a predominantly sexual mode of transmission: Secondary | ICD-10-CM | POA: Diagnosis present

## 2022-05-14 DIAGNOSIS — Z8619 Personal history of other infectious and parasitic diseases: Secondary | ICD-10-CM

## 2022-05-14 DIAGNOSIS — Z124 Encounter for screening for malignant neoplasm of cervix: Secondary | ICD-10-CM | POA: Insufficient documentation

## 2022-05-14 DIAGNOSIS — Z01419 Encounter for gynecological examination (general) (routine) without abnormal findings: Secondary | ICD-10-CM | POA: Diagnosis not present

## 2022-05-14 DIAGNOSIS — Z3041 Encounter for surveillance of contraceptive pills: Secondary | ICD-10-CM

## 2022-05-14 MED ORDER — VALACYCLOVIR HCL 500 MG PO TABS: ORAL_TABLET | ORAL | 2 refills | Status: DC

## 2022-05-14 MED ORDER — BALZIVA 0.4-35 MG-MCG PO TABS: 1.0000 | ORAL_TABLET | Freq: Every day | ORAL | 3 refills | Status: DC

## 2022-05-14 NOTE — Patient Instructions (Signed)
EXERCISE   We recommended that you start or continue a regular exercise program for good health. Physical activity is anything that gets your body moving, some is better than none. The CDC recommends 150 minutes per week of Moderate-Intensity Aerobic Activity and 2 or more days of Muscle Strengthening Activity.  Benefits of exercise are limitless: helps weight loss/weight maintenance, improves mood and energy, helps with depression and anxiety, improves sleep, tones and strengthens muscles, improves balance, improves bone density, protects from chronic conditions such as heart disease, high blood pressure and diabetes and so much more. To learn more visit: https://www.cdc.gov/physicalactivity/index.html  DIET: Good nutrition starts with a healthy diet of fruits, vegetables, whole grains, and lean protein sources. Drink plenty of water for hydration. Minimize empty calories, sodium, sweets. For more information about dietary recommendations visit: https://health.gov/our-work/nutrition-physical-activity/dietary-guidelines and https://www.myplate.gov/  ALCOHOL:  Women should limit their alcohol intake to no more than 7 drinks/beers/glasses of wine (combined, not each!) per week. Moderation of alcohol intake to this level decreases your risk of breast cancer and liver damage.  If you are concerned that you may have a problem, or your friends have told you they are concerned about your drinking, there are many resources to help. A well-known program that is free, effective, and available to all people all over the nation is Alcoholics Anonymous.  Check out this site to learn more: https://www.aa.org/   CALCIUM AND VITAMIN D:  Adequate intake of calcium and Vitamin D are recommended for bone health.  You should be getting between 1000-1200 mg of calcium and 800 units of Vitamin D daily between diet and supplements  PAP SMEARS:  Pap smears, to check for cervical cancer or precancers,  have traditionally been  done yearly, scientific advances have shown that most women can have pap smears less often.  However, every woman still should have a physical exam from her gynecologist every year. It will include a breast check, inspection of the vulva and vagina to check for abnormal growths or skin changes, a visual exam of the cervix, and then an exam to evaluate the size and shape of the uterus and ovaries. We will also provide age appropriate advice regarding health maintenance, like when you should have certain vaccines, screening for sexually transmitted diseases, bone density testing, colonoscopy, mammograms, etc.   MAMMOGRAMS:  All women over 40 years old should have a routine mammogram.   COLON CANCER SCREENING: Now recommend starting at age 45. At this time colonoscopy is not covered for routine screening until 50. There are take home tests that can be done between 45-49.   COLONOSCOPY:  Colonoscopy to screen for colon cancer is recommended for all women at age 50.  We know, you hate the idea of the prep.  We agree, BUT, having colon cancer and not knowing it is worse!!  Colon cancer so often starts as a polyp that can be seen and removed at colonscopy, which can quite literally save your life!  And if your first colonoscopy is normal and you have no family history of colon cancer, most women don't have to have it again for 10 years.  Once every ten years, you can do something that may end up saving your life, right?  We will be happy to help you get it scheduled when you are ready.  Be sure to check your insurance coverage so you understand how much it will cost.  It may be covered as a preventative service at no cost, but you should check   your particular policy.      Breast Self-Awareness Breast self-awareness means being familiar with how your breasts look and feel. It involves checking your breasts regularly and reporting any changes to your health care provider. Practicing breast self-awareness is  important. A change in your breasts can be a sign of a serious medical problem. Being familiar with how your breasts look and feel allows you to find any problems early, when treatment is more likely to be successful. All women should practice breast self-awareness, including women who have had breast implants. How to do a breast self-exam One way to learn what is normal for your breasts and whether your breasts are changing is to do a breast self-exam. To do a breast self-exam: Look for Changes  Remove all the clothing above your waist. Stand in front of a mirror in a room with good lighting. Put your hands on your hips. Push your hands firmly downward. Compare your breasts in the mirror. Look for differences between them (asymmetry), such as: Differences in shape. Differences in size. Puckers, dips, and bumps in one breast and not the other. Look at each breast for changes in your skin, such as: Redness. Scaly areas. Look for changes in your nipples, such as: Discharge. Bleeding. Dimpling. Redness. A change in position. Feel for Changes Carefully feel your breasts for lumps and changes. It is best to do this while lying on your back on the floor and again while sitting or standing in the shower or tub with soapy water on your skin. Feel each breast in the following way: Place the arm on the side of the breast you are examining above your head. Feel your breast with the other hand. Start in the nipple area and make  inch (2 cm) overlapping circles to feel your breast. Use the pads of your three middle fingers to do this. Apply light pressure, then medium pressure, then firm pressure. The light pressure will allow you to feel the tissue closest to the skin. The medium pressure will allow you to feel the tissue that is a little deeper. The firm pressure will allow you to feel the tissue close to the ribs. Continue the overlapping circles, moving downward over the breast until you feel your  ribs below your breast. Move one finger-width toward the center of the body. Continue to use the  inch (2 cm) overlapping circles to feel your breast as you move slowly up toward your collarbone. Continue the up and down exam using all three pressures until you reach your armpit.  Write Down What You Find  Write down what is normal for each breast and any changes that you find. Keep a written record with breast changes or normal findings for each breast. By writing this information down, you do not need to depend only on memory for size, tenderness, or location. Write down where you are in your menstrual cycle, if you are still menstruating. If you are having trouble noticing differences in your breasts, do not get discouraged. With time you will become more familiar with the variations in your breasts and more comfortable with the exam. How often should I examine my breasts? Examine your breasts every month. If you are breastfeeding, the best time to examine your breasts is after a feeding or after using a breast pump. If you menstruate, the best time to examine your breasts is 5-7 days after your period is over. During your period, your breasts are lumpier, and it may be more   difficult to notice changes. When should I see my health care provider? See your health care provider if you notice: A change in shape or size of your breasts or nipples. A change in the skin of your breast or nipples, such as a reddened or scaly area. Unusual discharge from your nipples. A lump or thick area that was not there before. Pain in your breasts. Anything that concerns you. Preparing for Pregnancy If you are planning to become pregnant, talk to your health care provider about preconception care. This type of care helps you prepare for a safe and healthy pregnancy. During this visit, your health care provider will: Do a complete physical exam, including a vaginal exam. Take your complete medical history. Give  you information, answer your questions, and address possible problems. What should be on my preconception checklist? Medical history Tell your health care provider about any medical conditions you have or have had. Your pregnancy or your ability to become pregnant may be affected by long-term (chronic) conditions, such as: Diabetes. High blood pressure (hypertension). Problems with your thyroid. Tell your health care provider about your family's medical history and your partner's medical history. If needed, discuss the benefits of genetic testing. This checks for conditions that may be passed from parent to child. Tell your health care provider if you have or have had any sexually transmitted infections (STIs). These can affect your pregnancy. In some cases, they can pass to your baby. Tell your health care provider about: Any problems you had with previous pregnancies. Any medicines you take. These include vitamins, herbs, supplements, and over-the-counter medicines. Your vaccine history. Discuss any vaccines that you may need. Diet Ask your health care provider what foods to eat to get a balance of nutrients. This is very important when you are pregnant or preparing to get pregnant. Take a folic acid supplement of 400 mcg daily and eat foods rich in folic acid to help prevent certain birth defects. You should also take a prenatal vitamin before pregnancy and keep taking it while pregnant. Ask your health care provider to help you reach a healthy weight before pregnancy. If you are overweight, you may have a higher risk for certain problems. These include having trouble getting pregnant (infertility), hypertension, diabetes, and early (preterm) birth. If you are underweight, you are more likely to have a baby who has a low birth weight. Lifestyle, work, and home Let your health care provider know about: Use of alcohol, illegal drugs, or tobacco. These products can cause serious problems for  your pregnancy and your baby. Fun and leisure activities that may put you at risk during pregnancy. These may include skiing and certain exercise programs. Any plans to travel out of the country, especially to places with an active outbreak of [place] virus. Harmful substances that you may be exposed to at work or home. These include chemicals, pesticides, radiation, and substances from cat litter boxes. Any concerns you have for your safety at home. Mental health Tell your health care provider about: Any history of mental health conditions, including feelings of depression, sadness, or anxiety. Any thoughts of suicide or previous attempts of suicide. Any medicines that you take for a mental health condition. These include herbs and supplements. How do I know that I am pregnant? You may be pregnant if you have been sexually active, and you miss your period. Other symptoms of early pregnancy may include: Mild cramping. Feeling more tired than usual. Nausea and vomiting. These may be signs of  morning sickness. Breast enlargement and tenderness. Take a home pregnancy test if you have any of these symptoms. This test checks for a hormone in your urine called human chorionic gonadotropin, or hCG. Your body begins to make this hormone during early pregnancy. Wait until at least the first day after you miss your period to take a test. What should I do if I become pregnant? Schedule a visit with your health care provider as soon as you suspect you are pregnant. Talk to your health care provider if you are taking medicines to see if they are safe to take during pregnancy. Follow these instructions at home: Eating and drinking  Follow instructions from your health care provider about what you may eat and drink. Drink enough fluids to keep your urine pale yellow. Eat a balanced diet. This includes fresh fruits and vegetables, whole grains, lean meats, low-fat dairy products, healthy fats, and foods that  are high in fiber. Wash all fresh fruits and vegetables before eating. Ask to meet with a nutritionist or registered dietitian for help with meal planning and goals. Avoid eating raw or undercooked meat and seafood. Avoid eating or drinking unpasteurized dairy products. Heat all processed foods to 165F. This includes precooked or cured meat, such as sausages or meat loaves. Lifestyle     Get regular exercise. Try to be active for at least 30 minutes a day on most days of the week. Ask your health care provider which activities are safe during pregnancy. Ask your health care provider about ways to manage stress. Maintain a healthy weight. Avoid toxic fumes and chemicals. Avoid cleaning cat litter boxes. Cat stool (feces) may contain a harmful parasite called toxoplasma. Avoid travel to countries where [place] virus is common. Do not use any products that contain nicotine or tobacco. These products include cigarettes, chewing tobacco, and vaping devices, such as e-cigarettes. If you need help quitting, ask your health care provider. Do not drink alcohol or use drugs. General instructions Keep an accurate record of your menstrual periods. This makes it easier for your health care provider to determine your baby's due date. Take over-the-counter and prescription medicines only as told by your health care provider. Manage any chronic conditions, such as hypertension and diabetes, as told by your health care provider. This information is not intended to replace advice given to you by your health care provider. Make sure you discuss any questions you have with your health care provider. Document Revised: 08/12/2021 Document Reviewed: 08/12/2021 Elsevier Patient Education  [place].

## 2022-05-15 LAB — HIV ANTIBODY (ROUTINE TESTING W REFLEX): HIV 1&2 Ab, 4th Generation: NONREACTIVE

## 2022-05-15 LAB — HEPATITIS C ANTIBODY: Hepatitis C Ab: NONREACTIVE

## 2022-05-15 LAB — RPR: RPR Ser Ql: NONREACTIVE

## 2022-05-18 LAB — CYTOLOGY - PAP
Chlamydia: NEGATIVE
Comment: NEGATIVE
Comment: NEGATIVE
Comment: NORMAL
Diagnosis: NEGATIVE
Neisseria Gonorrhea: NEGATIVE
Trichomonas: NEGATIVE

## 2022-05-19 NOTE — Telephone Encounter (Addendum)
Returned call to patient. Pt reports that she went to have BM this morning and did have to strain some. Pt reports that when she went to wipe she noticed BRBPR and in the commode. I informed patient that typically BRB is hemorrhoidal bleeding. I advised patient to purchase OTC Preparation H suppositories and use BID until symptoms resolve. Pt had several questions on how to use suppository, I provided her with that information. Pt has also been advised to use wet wipes instead of toilet paper and limit commode time. Pt was informed that she will need to go to ED if she develops any SOB, fatigue, or lightheadedness.  Pt states that she will discuss recommendations with her parents. Pt was advised to keep her upcoming appt as scheduled. Otherwise, pt verbalized understanding and had no concerns at the end of the call.

## 2022-05-19 NOTE — Telephone Encounter (Signed)
Inbound call from patient stating she had a bowl movement this morning and when she went to wipe she was bleeding from her rectum. Please advise.   Thank you

## 2022-05-31 ENCOUNTER — Ambulatory Visit (INDEPENDENT_AMBULATORY_CARE_PROVIDER_SITE_OTHER): Payer: Managed Care, Other (non HMO) | Admitting: Gastroenterology

## 2022-05-31 ENCOUNTER — Encounter: Payer: Self-pay | Admitting: Gastroenterology

## 2022-05-31 ENCOUNTER — Other Ambulatory Visit (INDEPENDENT_AMBULATORY_CARE_PROVIDER_SITE_OTHER): Payer: Managed Care, Other (non HMO)

## 2022-05-31 VITALS — BP 120/88 | HR 101 | Ht 67.0 in | Wt 334.0 lb

## 2022-05-31 DIAGNOSIS — E739 Lactose intolerance, unspecified: Secondary | ICD-10-CM | POA: Diagnosis not present

## 2022-05-31 DIAGNOSIS — K529 Noninfective gastroenteritis and colitis, unspecified: Secondary | ICD-10-CM | POA: Diagnosis not present

## 2022-05-31 DIAGNOSIS — D509 Iron deficiency anemia, unspecified: Secondary | ICD-10-CM | POA: Diagnosis not present

## 2022-05-31 LAB — CBC WITH DIFFERENTIAL/PLATELET
Basophils Absolute: 0.1 10*3/uL (ref 0.0–0.1)
Basophils Relative: 1 % (ref 0.0–3.0)
Eosinophils Absolute: 0.3 10*3/uL (ref 0.0–0.7)
Eosinophils Relative: 2.8 % (ref 0.0–5.0)
HCT: 44.5 % (ref 36.0–46.0)
Hemoglobin: 15.8 g/dL — ABNORMAL HIGH (ref 12.0–15.0)
Lymphocytes Relative: 22.4 % (ref 12.0–46.0)
Lymphs Abs: 2.1 10*3/uL (ref 0.7–4.0)
MCHC: 35.4 g/dL (ref 30.0–36.0)
MCV: 82.6 fl (ref 78.0–100.0)
Monocytes Absolute: 0.7 10*3/uL (ref 0.1–1.0)
Monocytes Relative: 7.3 % (ref 3.0–12.0)
Neutro Abs: 6.2 10*3/uL (ref 1.4–7.7)
Neutrophils Relative %: 66.5 % (ref 43.0–77.0)
Platelets: 414 10*3/uL — ABNORMAL HIGH (ref 150.0–400.0)
RBC: 5.39 Mil/uL — ABNORMAL HIGH (ref 3.87–5.11)
RDW: 13 % (ref 11.5–15.5)
WBC: 9.3 10*3/uL (ref 4.0–10.5)

## 2022-05-31 LAB — IBC + FERRITIN
Ferritin: 30.6 ng/mL (ref 10.0–291.0)
Iron: 56 ug/dL (ref 42–145)
Saturation Ratios: 10.2 % — ABNORMAL LOW (ref 20.0–50.0)
TIBC: 547.4 ug/dL — ABNORMAL HIGH (ref 250.0–450.0)
Transferrin: 391 mg/dL — ABNORMAL HIGH (ref 212.0–360.0)

## 2022-05-31 MED ORDER — COLESEVELAM HCL 625 MG PO TABS: 1250.0000 mg | ORAL_TABLET | Freq: Two times a day (BID) | ORAL | 6 refills | Status: DC

## 2022-05-31 NOTE — Progress Notes (Addendum)
[facility] Progress Note  Chief Complaint:  Chief Complaint  Patient presents with   Diarrhea    Pt states still having loose stool but medication is slightly working. Pt has a question about the time she take medication.    Subjective  History:  Patient was last seen by me on 04/22/22 for follow up for diarrhea. She had pre-existing occasional diarrhea since cholecystectomy a few years ago. GI pathogen panel ordered and dicyclomine prescribed.  Pathogen panel negative for multiple organisms including C. difficile and also negative lactoferrin.  At her last visit with me she continued to have some abdominal bloating and intermittent cramps but did not find Dicylomine to be very helpful. She was started on Colesevelam 1250 BID at that time. Since last visit she had a normal ESR and normal tTg.   Today, She is feeling somewhat better. She has noticed improvement of her diarrhea on Colesevelam. She reports taking Colesevelam first dose in the morning around 9AM and then around 5-6PM with dinner. She also complains of having headaches which are chronic for her and she wonders if the Colesevelam can contribute to this problem. She states that her upset stomach issues have been better. She reports her BM has better form and the frequency of her diarrhea has improved but is still not completely normal yet.     She states that she feels as if she may be getting anemia again. She would like to have labs done today for this as she cannot see her oncologist for quite a while for IDA follow up. She is followed by Dr. Julien Nordmann and is scheduled to see him on 07/22/22.  ROS: Review of Systems  Constitutional:  Negative for appetite change and fever.  HENT:  Negative for trouble swallowing.   Respiratory:  Negative for cough and shortness of breath.   Cardiovascular:  Negative for chest pain.  Gastrointestinal:  Positive for diarrhea. Negative for abdominal distention, abdominal pain, anal  bleeding, blood in stool, constipation, nausea, rectal pain and vomiting.  Genitourinary:  Negative for dysuria.  Musculoskeletal:  Negative for back pain.  Skin:  Negative for rash.  Neurological:  Negative for weakness.  All other systems reviewed and are negative.    The patient's Past Medical, Family and Social History were reviewed and are on file in the EMR.  Objective:  Med list reviewed  Current Outpatient Medications:    Acetaminophen-Caff-Pyrilamine (MIDOL MAX ST MENSTRUAL PO), Take 2 tablets by mouth 2 (two) times daily as needed (cramps)., Disp: , Rfl:    ADVAIR HFA 230-21 MCG/ACT inhaler, Inhale 2 puffs into the lungs 2 (two) times daily., Disp: 12 g, Rfl: 5   albuterol (VENTOLIN HFA) 108 (90 Base) MCG/ACT inhaler, Inhale 2 puffs into the lungs every 6 (six) hours as needed for wheezing or shortness of breath., Disp: 8 g, Rfl: 6   aspirin-acetaminophen-caffeine (EXCEDRIN EXTRA STRENGTH) 250-250-65 MG tablet, Take 2 tablets by mouth every 8 (eight) hours as needed for headache or migraine., Disp: , Rfl:    BALZIVA 0.4-35 MG-MCG tablet, Take 1 tablet by mouth daily., Disp: 84 tablet, Rfl: 3   cetirizine (ZYRTEC) 10 MG tablet, Take 10 mg by mouth daily as needed for allergies., Disp: , Rfl:    cholecalciferol (VITAMIN D3) 25 MCG (1000 UNIT) tablet, Take 1,000 Units by mouth daily., Disp: , Rfl:    FeFum-FePoly-FA-B Cmp-C-Biot (INTEGRA PLUS) CAPS, Take 1 capsule by mouth every morning., Disp: 30 capsule, Rfl: 2  ibuprofen (ADVIL,MOTRIN) 200 MG tablet, Take 400 mg by mouth every 6 (six) hours as needed for mild pain or headache., Disp: , Rfl:    lactase (LACTAID) 3000 units tablet, Take by mouth 3 (three) times daily with meals., Disp: , Rfl:    valACYclovir (VALTREX) 500 MG tablet, Take one tablet po BID x 3 days with an outbreak, Disp: 30 tablet, Rfl: 2   Vitamin D, Ergocalciferol, (DRISDOL) 1.25 MG (50000 UNIT) CAPS capsule, Take 1 capsule (50,000 Units total) by mouth every 7  (seven) days., Disp: 5 capsule, Rfl: 0   zinc gluconate 50 MG tablet, Take 50 mg by mouth daily., Disp: , Rfl:    colesevelam (WELCHOL) 625 MG tablet, Take 2 tablets (1,250 mg total) by mouth 2 (two) times daily with a meal. Do not take within 4 hours of other medicines, Disp: 120 tablet, Rfl: 6   Vital signs in last 24 hrs: Vitals:   05/31/22 1455  BP: 120/88  Pulse: (!) 101  SpO2: 98%   Wt Readings from Last 3 Encounters:  05/31/22 (!) 334 lb (151.5 kg)  05/14/22 (!) 336 lb (152.4 kg)  04/22/22 (!) 338 lb 2 oz (153.4 kg)     Physical Exam Well-appearing-no additional exam.  Entire visit spent in review of results, symptoms and plan.  Labs:  CRP HS 7.200 on 04/22/2022  IgA 177 on 04/22/2022     Component Value Date/Time   ESRSEDRATE 13 04/22/2022 1459   ___________________________________________ Radiologic studies:   ____________________________________________ Other:   _____________________________________________ Assessment & Plan   Assessment: Chronic diarrhea - Plan: CBC with Differential/Platelet, IBC + Ferritin  Lactose intolerance - Plan: CBC with Differential/Platelet, IBC + Ferritin  Iron deficiency anemia, unspecified iron deficiency anemia type  Bile acid diarrhea, improved on WelChol. Think it is unlikely she has IBD currently not planning a colonoscopy.  Plan: Patient is advised to reduce fatty foods/ fast food.  Based on previous discussions, she is also lactose intolerant. Colesevelam refill provided. CBC and iron panel at her request, results to go to her hematologist.  See me in 6 months or sooner if needed.   Amada Jupiter, MD    Corinda Gubler Roxine Caddy, Charlie Pitter III, MD, have reviewed all documentation for this visit. The documentation on 05/31/22 for the exam, diagnosis, procedures, and orders are all accurate and complete.  20 minutes were spent on this encounter (including chart review, history/exam, counseling/coordination of care, and  documentation) > 50% of that time was spent on counseling and coordination of care.    I,Alexis Herring,acting as a Neurosurgeon for [employer], MD.,have documented all relevant documentation on the behalf of Sherrilyn Rist, MD,as directed by  Sherrilyn Rist, MD while in the presence of Sherrilyn Rist, MD.  Pura Spice

## 2022-05-31 NOTE — Patient Instructions (Signed)
_______________________________________________________  If your blood pressure at your visit was 140/90 or greater, please contact your primary care physician to follow up on this.  _______________________________________________________  If you are age 22 or older, your body mass index should be between 23-30. Your Body mass index is 52.31 kg/m. If this is out of the aforementioned range listed, please consider follow up with your Primary Care Provider.  If you are age 20 or younger, your body mass index should be between 19-25. Your Body mass index is 52.31 kg/m. If this is out of the aformentioned range listed, please consider follow up with your Primary Care Provider.   ________________________________________________________  The [facility] providers would like to encourage you to use [facility] to communicate with providers for non-urgent requests or questions.  Due to long hold times on the telephone, sending your provider a message by [facility] may be a faster and more efficient way to get a response.  Please allow 48 business hours for a response.  Please remember that this is for non-urgent requests.  _______________________________________________________  Your provider has requested that you go to the basement level for lab work before leaving today. Press "B" on the elevator. The lab is located at the first door on the left as you exit the elevator.  Due to recent changes in healthcare laws, you may see the results of your imaging and laboratory studies on MyChart before your provider has had a chance to review them.  We understand that in some cases there may be results that are confusing or concerning to you. Not all laboratory results come back in the same time frame and the provider may be waiting for multiple results in order to interpret others.  Please give [place] 48 hours in order for your provider to thoroughly review all the results before contacting the office for clarification of  your results.    It was a pleasure to see you today!  Thank you for trusting me with your gastrointestinal care!

## 2022-06-02 ENCOUNTER — Encounter: Payer: Self-pay | Admitting: Physician Assistant

## 2022-06-15 ENCOUNTER — Telehealth: Payer: Self-pay | Admitting: Internal Medicine

## 2022-06-15 NOTE — Telephone Encounter (Signed)
Called patient regarding upcoming April appointments, left a voicemail. 

## 2022-07-06 ENCOUNTER — Ambulatory Visit (INDEPENDENT_AMBULATORY_CARE_PROVIDER_SITE_OTHER): Payer: Managed Care, Other (non HMO) | Admitting: Obstetrics and Gynecology

## 2022-07-06 ENCOUNTER — Encounter: Payer: Self-pay | Admitting: Obstetrics and Gynecology

## 2022-07-06 VITALS — BP 128/82 | HR 65 | Wt 335.0 lb

## 2022-07-06 DIAGNOSIS — N926 Irregular menstruation, unspecified: Secondary | ICD-10-CM

## 2022-07-06 LAB — PREGNANCY, URINE: Preg Test, Ur: NEGATIVE

## 2022-07-06 NOTE — Progress Notes (Signed)
GYNECOLOGY  VISIT   HPI: 22 y.o.   Single White or Caucasian Not Hispanic or Latino  female   [id number] with Patient's last menstrual period was 04/27/2022.   here for missed menses. Patient states that she has not had a period since January.  She stopped taking her OCP about one week ago, prior to that she was taking them daily (cyclically not continuously).  She was last sexually active in February, boyfriend lives out of town.  Patient states that she took a home test yesterday and it was negative. She went off of OCP's to try and bring on a cycle.  Prior to February her cycles were monthly x 4 days on OCP's.  GYNECOLOGIC HISTORY: Patient's last menstrual period was 04/27/2022. Contraception:none  Menopausal hormone therapy: none         OB History     Gravida  0   Para  0   Term  0   Preterm  0   AB  0   Living  0      SAB  0   IAB  0   Ectopic  0   Multiple  0   Live Births  0              Patient Active Problem List   Diagnosis Date Noted   Iron deficiency anemia 10/23/2021   Other fatigue 09/30/2021   SOB (shortness of breath) on exertion 09/30/2021   Gastroesophageal reflux disease 09/30/2021   Iron deficiency 09/30/2021   Depression screening 09/30/2021   Class 3 severe obesity with serious comorbidity and body mass index (BMI) of 45.0 to 49.9 in adult 09/30/2021   HSV-2 infection 01/09/2021   PCOS (polycystic ovarian syndrome)    Dysmetabolic syndrome [id number]   Increased BMI 07/17/2017   Melanocytic nevus 07/17/2017   Hypovitaminosis D 09/04/2015   Hypertriglyceridemia 04/10/2015   Acanthosis 04/10/2015   Insulin resistance 04/10/2015   Menorrhagia 04/10/2015   CHICKENPOX, HX OF 08/03/2006    Past Medical History:  Diagnosis Date   Abnormal uterine bleeding    Acid reflux    Anemia    Asthma    Fatty liver    High blood pressure    High cholesterol    Ingrown left big toenail 09/2016   recurrent   PCOS (polycystic ovarian  syndrome)    Pre-diabetes    Seasonal allergies     Past Surgical History:  Procedure Laterality Date   GALLBLADDER SURGERY  2021   LESION EXCISION Left 07/11/2014   Procedure: EXCISION OF BENIGN LESIONS INCLUDING MARGINS ;  Surgeon: Gerald Stabs, MD;  Location: [place];  Service: Pediatrics;  Laterality: Left;  mid back  and right neck   TOENAIL EXCISION  01/27/2012   Procedure: MINOR TOENAIL EXCISION;  Surgeon: Jerilynn Mages. Gerald Stabs, MD;  Location: [place];  Service: Pediatrics;  Laterality: Left;   TOENAIL EXCISION  03/23/2012   Procedure: MINOR TOENAIL EXCISION;  Surgeon: Jerilynn Mages. Gerald Stabs, MD;  Location: [place];  Service: Pediatrics;  Laterality: Right;   TOENAIL EXCISION Right 09/21/2012   Procedure: Partial Excision of Right Toenail;  Surgeon: Jerilynn Mages. Gerald Stabs, MD;  Location: [place];  Service: Pediatrics;  Laterality: Right;   TOENAIL EXCISION Left 02/15/2013   Procedure: PARTIAL TOENAIL EXCISION ON THE LEFT BIG TOE AT THE OUTER LATERAL FOLD WITH PHENOL (MP ROOM) ;  Surgeon: Jerilynn Mages. Gerald Stabs, MD;  Location: [place];  Service:  Pediatrics;  Laterality: Left;   TOENAIL EXCISION Left 08/16/2013   Procedure: PARTIAL EXCISION OF LEFT BIG TOE NAIL FROM OUTER LATERAL FOLD;  Surgeon: Jerilynn Mages. Gerald Stabs, MD;  Location: [place];  Service: Pediatrics;  Laterality: Left;   TOENAIL EXCISION Left 09/16/2016   Procedure: PARTIAL PERMANENT EXCISION OF LEFT INGROWN BIG TOENAIL;  Surgeon: Gerald Stabs, MD;  Location: [place];  Service: General;  Laterality: Left;   WISDOM TOOTH EXTRACTION  12/08/2018   all 4    Current Outpatient Medications  Medication Sig Dispense Refill   Acetaminophen-Caff-Pyrilamine (MIDOL MAX ST MENSTRUAL PO) Take 2 tablets by mouth 2 (two) times daily as needed (cramps).     ADVAIR HFA 230-21 MCG/ACT inhaler Inhale 2 puffs into the lungs 2 (two)  times daily. 12 g 5   albuterol (VENTOLIN HFA) 108 (90 Base) MCG/ACT inhaler Inhale 2 puffs into the lungs every 6 (six) hours as needed for wheezing or shortness of breath. 8 g 6   aspirin-acetaminophen-caffeine (EXCEDRIN EXTRA STRENGTH) 250-250-65 MG tablet Take 2 tablets by mouth every 8 (eight) hours as needed for headache or migraine.     cetirizine (ZYRTEC) 10 MG tablet Take 10 mg by mouth daily as needed for allergies.     cholecalciferol (VITAMIN D3) 25 MCG (1000 UNIT) tablet Take 1,000 Units by mouth daily.     colesevelam (WELCHOL) 625 MG tablet Take 2 tablets (1,250 mg total) by mouth 2 (two) times daily with a meal. Do not take within 4 hours of other medicines 120 tablet 6   FeFum-FePoly-FA-B Cmp-C-Biot (INTEGRA PLUS) CAPS Take 1 capsule by mouth every morning. 30 capsule 2   ibuprofen (ADVIL,MOTRIN) 200 MG tablet Take 400 mg by mouth every 6 (six) hours as needed for mild pain or headache.     lactase (LACTAID) 3000 units tablet Take by mouth 3 (three) times daily with meals.     valACYclovir (VALTREX) 500 MG tablet Take one tablet po BID x 3 days with an outbreak 30 tablet 2   Vitamin D, Ergocalciferol, (DRISDOL) 1.25 MG (50000 UNIT) CAPS capsule Take 1 capsule (50,000 Units total) by mouth every 7 (seven) days. 5 capsule 0   zinc gluconate 50 MG tablet Take 50 mg by mouth daily.     BALZIVA 0.4-35 MG-MCG tablet Take 1 tablet by mouth daily. (Patient not taking: Reported on 07/06/2022) 84 tablet 3   No current facility-administered medications for this visit.     ALLERGIES: Clindamycin/lincomycin  Family History  Problem Relation Age of Onset   Asthma Mother    Obesity Mother    Diabetes Father    Hypertension Father    Asthma Father    High Cholesterol Father    Obesity Father    Cervical cancer Maternal Grandmother    Cirrhosis Paternal Grandmother        caused by medication   Diabetes Paternal Grandmother     Social History   Socioeconomic History   Marital  status: Single    Spouse name: Not on file   Number of children: Not on file   Years of education: Not on file   Highest education level: Not on file  Occupational History   Not on file  Tobacco Use   Smoking status: Some Days    Packs/day: 0.25    Years: 2.00    Additional pack years: 0.00    Total pack years: 0.50    Types: Cigarettes   Smokeless tobacco: Never  Tobacco comments:    1-2 a day  Vaping Use   Vaping Use: Never used  Substance and Sexual Activity   Alcohol use: No   Drug use: No   Sexual activity: Not Currently    Birth control/protection: Abstinence, OCP  Other Topics Concern   Not on file  Social History Narrative   Not on file   Social Determinants of Health   Financial Resource Strain: Not on file  Food Insecurity: No Food Insecurity (01/02/2021)   Hunger Vital Sign    Worried About Running Out of Food in the Last Year: Never true    Ran Out of Food in the Last Year: Never true  Transportation Needs: Not on file  Physical Activity: Not on file  Stress: Not on file  Social Connections: Not on file  Intimate Partner Violence: Not on file    Review of Systems  All other systems reviewed and are negative.   PHYSICAL EXAMINATION:    BP 128/82   Pulse 65   Wt (!) 335 lb (152 kg)   LMP 04/27/2022   SpO2 100%   BMI 52.47 kg/m     General appearance: alert, cooperative and appears stated age  32. Missed menses She recently stopped cycling on combination OCP's. Patient reassured that as long as she is taking her pills consistently it's okay to not cycle on OCP's.  - Pregnancy, urine: negative -She will restart OCP's, use condoms for one week -Call with any concerns

## 2022-07-19 NOTE — Progress Notes (Unsigned)
[facility] OFFICE PROGRESS NOTE  Terri Saint, MD [address] [place] [address]  DIAGNOSIS:  Iron deficiency anemia   PRIOR THERAPY: None   CURRENT THERAPY:  1) PRN IV iron, last dose on 11/13/21  2) OTC iron supplement p.o. every other day  INTERVAL HISTORY: Terri Cook 22 y.o. female returns to the clinic today for a follow-up visit.  The patient was first seen in the clinic on 02/01/23.  The patient had new onset anemia first seen in February 2023.     The patient reports she is doing "okay." She states she is still tired. She denies any abnormal bleeding.  She actually has been having elevated hemoglobin the last few appointments.  The patient has asthma but states it is under control at this time.  She follows with Dr. Everardo All from pulmonary medicine.  She is also currently taking birth control but has been on the same birth control for several years.  She is actually not had a menstrual cycle in 4 months.  She recently followed up with her GYN this month regarding this.  She had a pregnancy test which was negative.  She denies any abdominal pain and does not observe her stool to know coloration/presence of blood. She sees Dr. Myrtie Neither from GI and had repeat labs performed in the interval. Her labs were stable.  She denies any abnormal bleeding including epistaxis, gingival bleeding, hemoptysis, hematemesis, melena, or hematochezia.  She was taking iron supplement 1 tablet p.o. daily but stopped this a couple days ago due to feeling like she does not need this.  She does report she does eat red meat frequently.  Denies any changes in her breathing.  She is here today for evaluation and repeat blood work.    MEDICAL HISTORY: Past Medical History:  Diagnosis Date   Abnormal uterine bleeding    Acid reflux    Anemia    Asthma    Fatty liver    High blood pressure    High cholesterol    Ingrown left big toenail 09/2016   recurrent   PCOS (polycystic  ovarian syndrome)    Pre-diabetes    Seasonal allergies     ALLERGIES:  is allergic to clindamycin/lincomycin.  MEDICATIONS:  Current Outpatient Medications  Medication Sig Dispense Refill   Acetaminophen-Caff-Pyrilamine (MIDOL MAX ST MENSTRUAL PO) Take 2 tablets by mouth 2 (two) times daily as needed (cramps).     ADVAIR HFA 230-21 MCG/ACT inhaler Inhale 2 puffs into the lungs 2 (two) times daily. 12 g 5   albuterol (VENTOLIN HFA) 108 (90 Base) MCG/ACT inhaler Inhale 2 puffs into the lungs every 6 (six) hours as needed for wheezing or shortness of breath. 8 g 6   aspirin-acetaminophen-caffeine (EXCEDRIN EXTRA STRENGTH) 250-250-65 MG tablet Take 2 tablets by mouth every 8 (eight) hours as needed for headache or migraine.     BALZIVA 0.4-35 MG-MCG tablet Take 1 tablet by mouth daily. (Patient not taking: Reported on 07/06/2022) 84 tablet 3   cetirizine (ZYRTEC) 10 MG tablet Take 10 mg by mouth daily as needed for allergies.     cholecalciferol (VITAMIN D3) 25 MCG (1000 UNIT) tablet Take 1,000 Units by mouth daily.     colesevelam (WELCHOL) 625 MG tablet Take 2 tablets (1,250 mg total) by mouth 2 (two) times daily with a meal. Do not take within 4 hours of other medicines 120 tablet 6   FeFum-FePoly-FA-B Cmp-C-Biot (INTEGRA PLUS) CAPS Take 1 capsule  by mouth every morning. 30 capsule 2   ibuprofen (ADVIL,MOTRIN) 200 MG tablet Take 400 mg by mouth every 6 (six) hours as needed for mild pain or headache.     lactase (LACTAID) 3000 units tablet Take by mouth 3 (three) times daily with meals.     valACYclovir (VALTREX) 500 MG tablet Take one tablet po BID x 3 days with an outbreak 30 tablet 2   Vitamin D, Ergocalciferol, (DRISDOL) 1.25 MG (50000 UNIT) CAPS capsule Take 1 capsule (50,000 Units total) by mouth every 7 (seven) days. 5 capsule 0   zinc gluconate 50 MG tablet Take 50 mg by mouth daily.     No current facility-administered medications for this visit.    SURGICAL HISTORY:  Past  Surgical History:  Procedure Laterality Date   GALLBLADDER SURGERY  2021   LESION EXCISION Left 07/11/2014   Procedure: EXCISION OF BENIGN LESIONS INCLUDING MARGINS ;  Surgeon: Leonia Corona, MD;  Location: [facility];  Service: Pediatrics;  Laterality: Left;  mid back  and right neck   TOENAIL EXCISION  01/27/2012   Procedure: MINOR TOENAIL EXCISION;  Surgeon: Judie Petit. Leonia Corona, MD;  Location: [facility];  Service: Pediatrics;  Laterality: Left;   TOENAIL EXCISION  03/23/2012   Procedure: MINOR TOENAIL EXCISION;  Surgeon: Judie Petit. Leonia Corona, MD;  Location: [facility];  Service: Pediatrics;  Laterality: Right;   TOENAIL EXCISION Right 09/21/2012   Procedure: Partial Excision of Right Toenail;  Surgeon: Judie Petit. Leonia Corona, MD;  Location: [facility];  Service: Pediatrics;  Laterality: Right;   TOENAIL EXCISION Left 02/15/2013   Procedure: PARTIAL TOENAIL EXCISION ON THE LEFT BIG TOE AT THE OUTER LATERAL FOLD WITH PHENOL (MP ROOM) ;  Surgeon: Judie Petit. Leonia Corona, MD;  Location: [facility];  Service: Pediatrics;  Laterality: Left;   TOENAIL EXCISION Left 08/16/2013   Procedure: PARTIAL EXCISION OF LEFT BIG TOE NAIL FROM OUTER LATERAL FOLD;  Surgeon: Judie Petit. Leonia Corona, MD;  Location: [facility];  Service: Pediatrics;  Laterality: Left;   TOENAIL EXCISION Left 09/16/2016   Procedure: PARTIAL PERMANENT EXCISION OF LEFT INGROWN BIG TOENAIL;  Surgeon: Leonia Corona, MD;  Location: [facility];  Service: General;  Laterality: Left;   WISDOM TOOTH EXTRACTION  12/08/2018   all 4    REVIEW OF SYSTEMS:   Review of Systems  Constitutional: Positive for fatigue.  Negative for appetite change, chills, fever and unexpected weight change.  HENT: Negative for mouth sores, nosebleeds, sore throat and trouble swallowing.   Eyes: Negative for eye problems and icterus.  Respiratory: Negative for cough,  hemoptysis, shortness of breath and wheezing.   Cardiovascular: Negative for chest pain and leg swelling.  Gastrointestinal: Negative for abdominal pain, constipation, diarrhea, nausea and vomiting.  Genitourinary: Negative for bladder incontinence, difficulty urinating, dysuria, frequency and hematuria.   Musculoskeletal: Negative for back pain, gait problem, neck pain and neck stiffness.  Skin: Negative for itching and rash.  Neurological: Negative for dizziness, extremity weakness, gait problem, headaches, light-headedness and seizures.  Hematological: Negative for adenopathy. Does not bruise/bleed easily.  Psychiatric/Behavioral: Negative for confusion, depression and sleep disturbance. The patient is not nervous/anxious.     PHYSICAL EXAMINATION:  Last menstrual period 04/27/2022.  ECOG PERFORMANCE STATUS: 0-1  Physical Exam  Constitutional: Oriented to person, place, and time and well-developed, well-nourished, and in no distress. HENT:  Head: Normocephalic and atraumatic.  Mouth/Throat: Oropharynx is clear and moist. No  oropharyngeal exudate.  Eyes: Conjunctivae are normal. Right eye exhibits no discharge. Left eye exhibits no discharge. No scleral icterus.  Neck: Normal range of motion. Neck supple.  Cardiovascular: Normal rate, regular rhythm, normal heart sounds and intact distal pulses.   Pulmonary/Chest: Effort normal and breath sounds normal. No respiratory distress. No wheezes. No rales.  Abdominal: Soft. Bowel sounds are normal. Exhibits no distension and no mass. There is no tenderness.  Musculoskeletal: Normal range of motion. Exhibits no edema.  Lymphadenopathy:    No cervical adenopathy.  Neurological: Alert and oriented to person, place, and time. Exhibits normal muscle tone. Gait normal. Coordination normal.  Skin: Skin is warm and dry. No rash noted. Not diaphoretic. No erythema. No pallor.  Psychiatric: Mood, memory and judgment normal.  Vitals  reviewed.  LABORATORY DATA: Lab Results  Component Value Date   WBC 9.3 05/31/2022   HGB 15.8 (H) 05/31/2022   HCT 44.5 05/31/2022   MCV 82.6 05/31/2022   PLT 414.0 (H) 05/31/2022      Chemistry      Component Value Date/Time   NA 139 10/22/2021 1248   NA 138 09/30/2021 1150   K 3.9 10/22/2021 1248   CL 104 10/22/2021 1248   CO2 29 10/22/2021 1248   BUN 6 10/22/2021 1248   BUN 7 09/30/2021 1150   CREATININE 0.67 10/22/2021 1248   CREATININE 0.79 09/27/2017 0000      Component Value Date/Time   CALCIUM 9.4 10/22/2021 1248   ALKPHOS 76 10/22/2021 1248   AST 19 10/22/2021 1248   ALT 36 10/22/2021 1248   BILITOT 0.7 10/22/2021 1248       RADIOGRAPHIC STUDIES:  No results found.   ASSESSMENT/PLAN:  This is a very pleasant 22 year old female referred to clinic for iron deficiency without anemia.    She communicated today she is not currently taking Iron supplements orally since having infusions.    Patient had a repeat CBC, iron studies, and ferritin drawn today.  The patient's labs show increased hemoglobin of 16.0 today.  I reviewed this with Dr. Arbutus Ped.  This is likely reactive possibly related to not having a menstrual cycle, high iron intake, birth control use, and asthma.  Her iron studies and ferritin are pending at this time.   Unlikely the patient will require IV iron unless she has significant iron deficiency on labs. Her iron studies are pending. I will call her with the results next week.  I will call her next week and arrange for follow-up after that.  I will also give her instructions on how often to take iron if needed.  The patient was advised to call immediately if she has any concerning symptoms in the interval. The patient voices understanding of current disease status and treatment options and is in agreement with the current care plan. All questions were answered. The patient knows to call the clinic with any problems, questions or concerns. We  can certainly see the patient much sooner if necessary    No orders of the defined types were placed in this encounter.    The total time spent in the appointment was 20-29 minutes  Issa Kosmicki L Kamaria Lucia, PA-C 07/19/22

## 2022-07-20 ENCOUNTER — Telehealth: Payer: Self-pay | Admitting: Physician Assistant

## 2022-07-20 NOTE — Telephone Encounter (Signed)
Patient called to verify time and date of appointment. 

## 2022-07-20 NOTE — Telephone Encounter (Signed)
Rescheduled 04/17 appointment to 04/18 due to provider pal, patient has bee called and notified.

## 2022-07-21 ENCOUNTER — Ambulatory Visit: Payer: Managed Care, Other (non HMO) | Admitting: Physician Assistant

## 2022-07-21 ENCOUNTER — Other Ambulatory Visit: Payer: Managed Care, Other (non HMO)

## 2022-07-22 ENCOUNTER — Inpatient Hospital Stay: Payer: Managed Care, Other (non HMO) | Admitting: Physician Assistant

## 2022-07-22 ENCOUNTER — Inpatient Hospital Stay: Payer: Managed Care, Other (non HMO) | Attending: Physician Assistant

## 2022-07-22 ENCOUNTER — Other Ambulatory Visit: Payer: Managed Care, Other (non HMO)

## 2022-07-22 ENCOUNTER — Ambulatory Visit: Payer: Managed Care, Other (non HMO) | Admitting: Internal Medicine

## 2022-07-22 ENCOUNTER — Other Ambulatory Visit: Payer: Self-pay

## 2022-07-22 VITALS — BP 139/92 | HR 90 | Temp 98.1°F | Resp 16 | Wt 334.3 lb

## 2022-07-22 DIAGNOSIS — E611 Iron deficiency: Secondary | ICD-10-CM

## 2022-07-22 DIAGNOSIS — K219 Gastro-esophageal reflux disease without esophagitis: Secondary | ICD-10-CM | POA: Insufficient documentation

## 2022-07-22 DIAGNOSIS — J45909 Unspecified asthma, uncomplicated: Secondary | ICD-10-CM | POA: Diagnosis not present

## 2022-07-22 DIAGNOSIS — Z79899 Other long term (current) drug therapy: Secondary | ICD-10-CM | POA: Insufficient documentation

## 2022-07-22 DIAGNOSIS — Z881 Allergy status to other antibiotic agents status: Secondary | ICD-10-CM | POA: Diagnosis not present

## 2022-07-22 DIAGNOSIS — Z793 Long term (current) use of hormonal contraceptives: Secondary | ICD-10-CM | POA: Diagnosis not present

## 2022-07-22 DIAGNOSIS — R5383 Other fatigue: Secondary | ICD-10-CM | POA: Insufficient documentation

## 2022-07-22 DIAGNOSIS — Z79624 Long term (current) use of inhibitors of nucleotide synthesis: Secondary | ICD-10-CM | POA: Diagnosis not present

## 2022-07-22 DIAGNOSIS — Z7951 Long term (current) use of inhaled steroids: Secondary | ICD-10-CM | POA: Diagnosis not present

## 2022-07-22 DIAGNOSIS — D509 Iron deficiency anemia, unspecified: Secondary | ICD-10-CM | POA: Diagnosis not present

## 2022-07-22 DIAGNOSIS — E282 Polycystic ovarian syndrome: Secondary | ICD-10-CM | POA: Diagnosis not present

## 2022-07-22 LAB — CBC WITH DIFFERENTIAL (CANCER CENTER ONLY)
Abs Immature Granulocytes: 0.04 10*3/uL (ref 0.00–0.07)
Basophils Absolute: 0.1 10*3/uL (ref 0.0–0.1)
Basophils Relative: 1 %
Eosinophils Absolute: 0.5 10*3/uL (ref 0.0–0.5)
Eosinophils Relative: 6 %
HCT: 44.4 % (ref 36.0–46.0)
Hemoglobin: 16 g/dL — ABNORMAL HIGH (ref 12.0–15.0)
Immature Granulocytes: 0 %
Lymphocytes Relative: 25 %
Lymphs Abs: 2.2 10*3/uL (ref 0.7–4.0)
MCH: 29.4 pg (ref 26.0–34.0)
MCHC: 36 g/dL (ref 30.0–36.0)
MCV: 81.6 fL (ref 80.0–100.0)
Monocytes Absolute: 0.7 10*3/uL (ref 0.1–1.0)
Monocytes Relative: 8 %
Neutro Abs: 5.3 10*3/uL (ref 1.7–7.7)
Neutrophils Relative %: 60 %
Platelet Count: 408 10*3/uL — ABNORMAL HIGH (ref 150–400)
RBC: 5.44 MIL/uL — ABNORMAL HIGH (ref 3.87–5.11)
RDW: 11.9 % (ref 11.5–15.5)
WBC Count: 9 10*3/uL (ref 4.0–10.5)
nRBC: 0 % (ref 0.0–0.2)

## 2022-07-22 LAB — FERRITIN: Ferritin: 48 ng/mL (ref 11–307)

## 2022-07-22 LAB — IRON AND IRON BINDING CAPACITY (CC-WL,HP ONLY)
Iron: 75 ug/dL (ref 28–170)
Saturation Ratios: 15 % (ref 10.4–31.8)
TIBC: 496 ug/dL — ABNORMAL HIGH (ref 250–450)
UIBC: 421 ug/dL (ref 148–442)

## 2022-07-26 ENCOUNTER — Telehealth: Payer: Self-pay | Admitting: Physician Assistant

## 2022-07-26 NOTE — Telephone Encounter (Signed)
I called her to review her labs. Her iron studies are acceptable. She has not had a period in 4 months and was recently seen by GYN. Her Hbg was elevated. We discussed several options, such as continuing to hold her iron until she has a menstrual cycle and resuming it only when she has cycles vs 2x a week or so. She is going to hold her iron and resume it when she starts menstruating again a few days per week. I sent a message to arrange follow up labs and visit in 3 months.

## 2022-07-27 ENCOUNTER — Telehealth: Payer: Self-pay | Admitting: Physician Assistant

## 2022-07-29 ENCOUNTER — Encounter: Payer: Self-pay | Admitting: Physician Assistant

## 2022-09-10 ENCOUNTER — Telehealth: Payer: Self-pay | Admitting: Gastroenterology

## 2022-09-10 NOTE — Telephone Encounter (Signed)
Pt reports she had food poisoning 2-3 weeks ago and she is over it now. States since then her medications do not seem to working as well, she is having diarrhea again. Pt takes welchol 1250mg  bid. Pt would like to know what Dr. Myrtie Neither recommends. Please advise.

## 2022-09-10 NOTE — Telephone Encounter (Signed)
I recommend increasing the welchol to 3 tablets/capsules twice daily. (She will need a new prescription).  If that does not help, then we should schedule a colonoscopy.  Please arrange a clinic visit with me or APP  - HD

## 2022-09-10 NOTE — Telephone Encounter (Signed)
Pt aware of recommendations per Dr. Myrtie Neither. Pt knows to increase med to 3 pills bid. Pt scheduled to see Willette Cluster NP 09/17/22 at 8:30am. Pt aware of appt.

## 2022-09-10 NOTE — Telephone Encounter (Signed)
Patient called states she starting to get abdominal pain and is seeking further advise.

## 2022-09-10 NOTE — Telephone Encounter (Signed)
Pt wanted to see how she did on the 3 pills bid before another script is sent in for her. She will let us know at the appt.

## 2022-09-17 ENCOUNTER — Ambulatory Visit (INDEPENDENT_AMBULATORY_CARE_PROVIDER_SITE_OTHER): Payer: Managed Care, Other (non HMO) | Admitting: Nurse Practitioner

## 2022-09-17 ENCOUNTER — Ambulatory Visit (HOSPITAL_BASED_OUTPATIENT_CLINIC_OR_DEPARTMENT_OTHER): Payer: Managed Care, Other (non HMO) | Admitting: Pulmonary Disease

## 2022-09-17 ENCOUNTER — Encounter: Payer: Self-pay | Admitting: Nurse Practitioner

## 2022-09-17 ENCOUNTER — Encounter (HOSPITAL_BASED_OUTPATIENT_CLINIC_OR_DEPARTMENT_OTHER): Payer: Self-pay | Admitting: Pulmonary Disease

## 2022-09-17 VITALS — BP 118/88 | HR 92 | Ht 67.0 in | Wt 342.0 lb

## 2022-09-17 VITALS — BP 144/94 | HR 101 | Temp 98.2°F | Ht 67.0 in | Wt 342.6 lb

## 2022-09-17 DIAGNOSIS — K580 Irritable bowel syndrome with diarrhea: Secondary | ICD-10-CM | POA: Diagnosis not present

## 2022-09-17 DIAGNOSIS — J4531 Mild persistent asthma with (acute) exacerbation: Secondary | ICD-10-CM

## 2022-09-17 DIAGNOSIS — R0683 Snoring: Secondary | ICD-10-CM | POA: Insufficient documentation

## 2022-09-17 MED ORDER — DIPHENOXYLATE-ATROPINE 2.5-0.025 MG PO TABS: 1.0000 | ORAL_TABLET | Freq: Two times a day (BID) | ORAL | 0 refills | Status: DC | PRN

## 2022-09-17 MED ORDER — DICYCLOMINE HCL 10 MG PO CAPS: 10.0000 mg | ORAL_CAPSULE | Freq: Two times a day (BID) | ORAL | 1 refills | Status: DC | PRN

## 2022-09-17 NOTE — Patient Instructions (Addendum)
Mild persistent asthma - well controlled --CONTINUE Advair 230-21 mcg TWO puffs TWICE a day. This is your EVERYDAY inhaler  --CONTINUE Albuterol AS NEEDED for shortness of breath or wheezing. This is your RESCUE inhaler  Snoring Polycythemia Shortness of breath --ORDER home sleep test

## 2022-09-17 NOTE — Progress Notes (Signed)
Assessment and Plan   Primary GI: Amada Jupiter, MD  Brief Narrative:  22 y.o. yo female whose past medical history includes but is not necessarily limited to lactose intolerance, chronic diarrhea, morbid obesity, asthma.   Chronic diarrhea and right sided abdominal pain  ( ? IBS) with recent worsening of symptoms following what sounds like gastroenteritis in April.  Prior to June she had been doing well on 2 WelChol twice daily.  We increased dose to 3 twice daily without any improvement in symptoms .  She is not toxic-appearing, nontender on abdominal exam -Trial of Dicyclomine 10 mg twice daily for abdominal pain # 60 , 1 refill  -Add Lomotil  twice daily as needed for greater than 3 to 4 loose bowel movements / day -Continue Welchol 3  twice daily.  -Call with condition update in a couple of weeks.  Marland Kitchen History of Present Illness   Chief complaint:  diarrhea and abdominal pain .   Patient has chronic diarrhea. She is lactose intolerant and has been avoiding lactose "for the most part". She has decreased fatty food intake. We started [facility] in January for chronic diarrhea and it worked well until getting food poisoning in April consisting of N/V/D and severe generalized abdominal pain. Also had temp of 101.  Patient recently called the office, we increased her WelChol to 3 twice daily.  She was given this appointment for follow-up  The increased dose of WelChol has not made any difference.  Having constant generalized right sided abdominal cramping , worse during defecation. Having up to 8 loose BMs / day. Doesn't check stool for blood. No nocturnal BMs. No joint aches She has eczema but no other skin lesions.  No recent antibiotics. No recent medication changes or dietary changes to correlate with worsening symptoms. Not using artifical sweeteners.    Previous GI Endoscopies / Labs / Imaging       Latest Ref Rng & Units 10/22/2021   12:48 PM 09/30/2021   11:50 AM 03/16/2021     2:51 PM  Hepatic Function  Total Protein 6.5 - 8.1 g/dL 7.2  7.2  6.4   Albumin 3.5 - 5.0 g/dL 4.2  4.6  3.4   AST 15 - 41 U/L 19  29  28    ALT 0 - 44 U/L 36  52  49   Alk Phosphatase 38 - 126 U/L 76  89  64   Total Bilirubin 0.3 - 1.2 mg/dL 0.7  1.0  0.6        Latest Ref Rng & Units 07/22/2022    1:55 PM 05/31/2022    3:27 PM 01/21/2022    8:48 AM  CBC  WBC 4.0 - 10.5 K/uL 9.0  9.3  9.5   Hemoglobin 12.0 - 15.0 g/dL 16.1  09.6  04.5   Hematocrit 36.0 - 46.0 % 44.4  44.5  43.6   Platelets 150 - 400 K/uL 408  414.0  399     Past Medical History:  Diagnosis Date   Abnormal uterine bleeding    Acid reflux    Anemia    Asthma    Fatty liver    High blood pressure    High cholesterol    Ingrown left big toenail 09/2016   recurrent   PCOS (polycystic ovarian syndrome)    Pre-diabetes    Seasonal allergies     Past Surgical History:  Procedure Laterality Date   GALLBLADDER SURGERY  2021   LESION EXCISION Left  07/11/2014   Procedure: EXCISION OF BENIGN LESIONS INCLUDING MARGINS ;  Surgeon: Leonia Corona, MD;  Location: [facility];  Service: Pediatrics;  Laterality: Left;  mid back  and right neck   TOENAIL EXCISION  01/27/2012   Procedure: MINOR TOENAIL EXCISION;  Surgeon: Judie Petit. Leonia Corona, MD;  Location: [facility];  Service: Pediatrics;  Laterality: Left;   TOENAIL EXCISION  03/23/2012   Procedure: MINOR TOENAIL EXCISION;  Surgeon: Judie Petit. Leonia Corona, MD;  Location: [facility];  Service: Pediatrics;  Laterality: Right;   TOENAIL EXCISION Right 09/21/2012   Procedure: Partial Excision of Right Toenail;  Surgeon: Judie Petit. Leonia Corona, MD;  Location: [facility];  Service: Pediatrics;  Laterality: Right;   TOENAIL EXCISION Left 02/15/2013   Procedure: PARTIAL TOENAIL EXCISION ON THE LEFT BIG TOE AT THE OUTER LATERAL FOLD WITH PHENOL (MP ROOM) ;  Surgeon: Judie Petit. Leonia Corona, MD;  Location: [facility];   Service: Pediatrics;  Laterality: Left;   TOENAIL EXCISION Left 08/16/2013   Procedure: PARTIAL EXCISION OF LEFT BIG TOE NAIL FROM OUTER LATERAL FOLD;  Surgeon: Judie Petit. Leonia Corona, MD;  Location: [facility];  Service: Pediatrics;  Laterality: Left;   TOENAIL EXCISION Left 09/16/2016   Procedure: PARTIAL PERMANENT EXCISION OF LEFT INGROWN BIG TOENAIL;  Surgeon: Leonia Corona, MD;  Location: [facility];  Service: General;  Laterality: Left;   WISDOM TOOTH EXTRACTION  12/08/2018   all 4    Current Medications, Allergies, Family History and Social History were reviewed in [employer] medical record.     Current Outpatient Medications  Medication Sig Dispense Refill   Acetaminophen-Caff-Pyrilamine (MIDOL MAX ST MENSTRUAL PO) Take 2 tablets by mouth 2 (two) times daily as needed (cramps).     ADVAIR HFA 230-21 MCG/ACT inhaler Inhale 2 puffs into the lungs 2 (two) times daily. 12 g 5   albuterol (VENTOLIN HFA) 108 (90 Base) MCG/ACT inhaler Inhale 2 puffs into the lungs every 6 (six) hours as needed for wheezing or shortness of breath. 8 g 6   aspirin-acetaminophen-caffeine (EXCEDRIN EXTRA STRENGTH) 250-250-65 MG tablet Take 2 tablets by mouth every 8 (eight) hours as needed for headache or migraine.     BALZIVA 0.4-35 MG-MCG tablet Take 1 tablet by mouth daily. 84 tablet 3   cetirizine (ZYRTEC) 10 MG tablet Take 10 mg by mouth daily as needed for allergies.     cholecalciferol (VITAMIN D3) 25 MCG (1000 UNIT) tablet Take 1,000 Units by mouth daily.     colesevelam (WELCHOL) 625 MG tablet Take 2 tablets (1,250 mg total) by mouth 2 (two) times daily with a meal. Do not take within 4 hours of other medicines 120 tablet 6   FeFum-FePoly-FA-B Cmp-C-Biot (INTEGRA PLUS) CAPS Take 1 capsule by mouth every morning. 30 capsule 2   ibuprofen (ADVIL,MOTRIN) 200 MG tablet Take 400 mg by mouth every 6 (six) hours as needed for mild pain or headache.     lactase  (LACTAID) 3000 units tablet Take by mouth 3 (three) times daily with meals.     valACYclovir (VALTREX) 500 MG tablet Take one tablet po BID x 3 days with an outbreak 30 tablet 2   Vitamin D, Ergocalciferol, (DRISDOL) 1.25 MG (50000 UNIT) CAPS capsule Take 1 capsule (50,000 Units total) by mouth every 7 (seven) days. 5 capsule 0   zinc gluconate 50 MG tablet Take 50 mg by mouth daily.     No current  facility-administered medications for this visit.    Review of Systems: No chest pain. No shortness of breath. No urinary complaints.    Physical Exam  Wt Readings from Last 3 Encounters:  09/17/22 (!) 342 lb (155.1 kg)  07/22/22 (!) 334 lb 4.8 oz (151.6 kg)  07/06/22 (!) 335 lb (152 kg)    BP 118/88 (BP Location: Left Arm, Patient Position: Sitting, Cuff Size: Large)   Pulse 92   Ht 5\' 7"  (1.702 m) Comment: height measured without shoes  Wt (!) 342 lb (155.1 kg)   LMP 08/30/2022   BMI 53.56 kg/m  Constitutional:  Pleasant, female in no acute distress. Psychiatric: Normal mood and affect. Behavior is normal. EENT: Pupils normal.  Conjunctivae are normal. No scleral icterus. Neck supple.  Cardiovascular: Normal rate, regular rhythm.  Pulmonary/chest: Effort normal and breath sounds normal. No wheezing, rales or rhonchi. Abdominal: Soft, nondistended, nontender. Bowel sounds active throughout. There are no masses palpable. No hepatomegaly. Neurological: Alert and oriented to person place and time.  Skin: Skin is warm and dry. No rashes noted.  Willette Cluster, NP  09/17/2022, 8:41 AM

## 2022-09-17 NOTE — Progress Notes (Unsigned)
Subjective:   PATIENT ID: Terri Cook GENDER: female DOB: 03/06/2001, MRN: [id number]   HPI  Chief Complaint  Patient presents with   Follow-up    Follow up. Patient stated anytime she laughs really hard she gets out of breath.     Reason for Visit: Follow-up  Terri Cook is a 22 year old female with asthma (no childhood history), chronic anemia on IV iron, PCOS, HTN HLD who presents for follow-up.  Initial consult 05/2020 Two weeks ago she began having gradually worsening shortness of breath with exertion associated chest tightness, wheezing and nonproductive coughing. Episodes will occur activity such as climbing stairs and at rest at times. Symptoms improved after using albuterol daily up to twice a day. Symptoms seem occur at night with similar severity. She reports chronic allergies manifested as watery eyes, rhinorrhea, scratchy throat which she takes zyrtec and chloratab, which she feels helps. Allergic to dust. Note by her PCP Dr. Salomon Fick on 05/06/19 was reviewed. Diagnosed with asthma. Referred to Pulmonary for further evaluation. Her brother and both parents have asthma. Denies recent illness or recent sick contacts. Denies exacerbations requiring steroids.  2023 - Reestablished care in 12/2021. Asthma perceived to be worsening on PRN Advair 230. Improved symptoms with improved compliance. Exacerbation in Nov  09/17/22 Since our last visit she is compliant with her Advair twice a day. She reports shortness of breath with laughing hard or with walking. She reports witnessed snoring. Sometimes will wake up suddenly with pouring in sweat every 2-3 weeks. Denies poor quality sleep but takes naps frequently.  Social History: Never smoker  ACT: Asthma Control Test ACT Total Score  09/17/2022 11:09 AM 17  03/02/2022  8:22 AM 19  12/25/2021 10:28 AM 19    2023 Jan Feb Mar April May June July Aug Sept Oct Nov Dec             X   2024 Jan Feb Mar April May June July Aug  Sept Oct Nov Dec                2025 Jan Feb Mar April May June July Aug Sept Oct Nov Dec                   Environmental exposures:  None Outdoor/indoor dog, rottweiler/blue heeler mix, shedding  Past Medical History:  Diagnosis Date   Abnormal uterine bleeding    Acid reflux    Anemia    Asthma    Fatty liver    High blood pressure    High cholesterol    Ingrown left big toenail 09/2016   recurrent   PCOS (polycystic ovarian syndrome)    Pre-diabetes    Seasonal allergies      Family History  Problem Relation Age of Onset   Asthma Mother    Obesity Mother    Diabetes Father    Hypertension Father    Asthma Father    High Cholesterol Father    Obesity Father    Cervical cancer Maternal Grandmother    Cirrhosis Paternal Grandmother        caused by medication   Diabetes Paternal Grandmother      Social History   Occupational History   Not on file  Tobacco Use   Smoking status: Some Days    Packs/day: 0.25    Years: 2.00    Additional pack years: 0.00    Total pack years: 0.50    Types: Cigarettes  Smokeless tobacco: Never   Tobacco comments:    1-2 a day  Vaping Use   Vaping Use: Never used  Substance and Sexual Activity   Alcohol use: No   Drug use: No   Sexual activity: Not Currently    Birth control/protection: Abstinence, OCP    Allergies  Allergen Reactions   Clindamycin/Lincomycin Anaphylaxis    Trouble breathing?     Outpatient Medications Prior to Visit  Medication Sig Dispense Refill   Acetaminophen-Caff-Pyrilamine (MIDOL MAX ST MENSTRUAL PO) Take 2 tablets by mouth 2 (two) times daily as needed (cramps).     ADVAIR HFA 230-21 MCG/ACT inhaler Inhale 2 puffs into the lungs 2 (two) times daily. 12 g 5   albuterol (VENTOLIN HFA) 108 (90 Base) MCG/ACT inhaler Inhale 2 puffs into the lungs every 6 (six) hours as needed for wheezing or shortness of breath. 8 g 6   aspirin-acetaminophen-caffeine (EXCEDRIN EXTRA STRENGTH) 250-250-65 MG  tablet Take 2 tablets by mouth every 8 (eight) hours as needed for headache or migraine.     BALZIVA 0.4-35 MG-MCG tablet Take 1 tablet by mouth daily. 84 tablet 3   cetirizine (ZYRTEC) 10 MG tablet Take 10 mg by mouth daily as needed for allergies.     cholecalciferol (VITAMIN D3) 25 MCG (1000 UNIT) tablet Take 1,000 Units by mouth daily.     colesevelam (WELCHOL) 625 MG tablet Take 2 tablets (1,250 mg total) by mouth 2 (two) times daily with a meal. Do not take within 4 hours of other medicines 120 tablet 6   dicyclomine (BENTYL) 10 MG capsule Take 1 capsule (10 mg total) by mouth 2 (two) times daily as needed for spasms. 60 capsule 1   diphenoxylate-atropine (LOMOTIL) 2.5-0.025 MG tablet Take 1 tablet by mouth 2 (two) times daily as needed for diarrhea or loose stools. 30 tablet 0   FeFum-FePoly-FA-B Cmp-C-Biot (INTEGRA PLUS) CAPS Take 1 capsule by mouth every morning. 30 capsule 2   ibuprofen (ADVIL,MOTRIN) 200 MG tablet Take 400 mg by mouth every 6 (six) hours as needed for mild pain or headache.     lactase (LACTAID) 3000 units tablet Take by mouth 3 (three) times daily with meals.     valACYclovir (VALTREX) 500 MG tablet Take one tablet po BID x 3 days with an outbreak 30 tablet 2   Vitamin D, Ergocalciferol, (DRISDOL) 1.25 MG (50000 UNIT) CAPS capsule Take 1 capsule (50,000 Units total) by mouth every 7 (seven) days. 5 capsule 0   zinc gluconate 50 MG tablet Take 50 mg by mouth daily.     No facility-administered medications prior to visit.    Review of Systems  Constitutional:  Negative for chills, diaphoresis, fever, malaise/fatigue and weight loss.  HENT:  Negative for congestion.   Respiratory:  Positive for shortness of breath. Negative for cough, hemoptysis, sputum production and wheezing.   Cardiovascular:  Negative for chest pain, palpitations and leg swelling.     Objective:   Vitals:   09/17/22 1107  BP: (!) 144/94  Pulse: (!) 101  Temp: 98.2 F (36.8 C)  TempSrc:  Oral  SpO2: 96%  Weight: (!) 342 lb 9.6 oz (155.4 kg)  Height: 5\' 7"  (1.702 m)   SpO2: 96 % O2 Device: None (Room air)  Physical Exam: General: Well-appearing, no acute distress HENT: [place], AT Eyes: EOMI, no scleral icterus Respiratory: Clear to auscultation bilaterally.  No crackles, wheezing or rales Cardiovascular: RRR, -M/R/G, no JVD Extremities:-Edema,-tenderness Neuro: AAO x4, CNII-XII grossly intact Psych:  Normal mood, normal affect  Data Reviewed:  Imaging: CXR 09/15/20 - No acute infiltrate, effusion or edema  PFT: None on file  Labs:    Latest Ref Rng & Units 07/22/2022    1:55 PM 05/31/2022    3:27 PM 01/21/2022    8:48 AM  CBC  WBC 4.0 - 10.5 K/uL 9.0  9.3  9.5   Hemoglobin 12.0 - 15.0 g/dL 16.1  09.6  04.5   Hematocrit 36.0 - 46.0 % 44.4  44.5  43.6   Platelets 150 - 400 K/uL 408  414.0  399    Absolute Eos  08/28/15 - 288 12/02/21 - 300 01/21/22 - 300     Assessment & Plan:   Discussion: 22 year old female never smoker with adult onset asthma who presents for follow-up. Well controlled asthma overall on bronchodilators however reports intermittent episodes of shortness of breath and nocturnal awakenings with sweating. Concerned for sleep disordered breathing  Mild persistent asthma - well controlled --CONTINUE Advair 230-21 mcg TWO puffs TWICE a day. This is your EVERYDAY inhaler  --CONTINUE Albuterol AS NEEDED for shortness of breath or wheezing. This is your RESCUE inhaler  Asthma Action Plan For worsening shortness of breath, wheezing and cough use albuterol for rescue. If you symptoms do not improve in 24-48 hours, please our office for evaluation and/or prednisone taper.  Snoring Polycythemia Shortness of breath --ORDER home sleep test  Health Maintenance Immunization History  Administered Date(s) Administered   DTaP 02/08/2001, 05/17/2001, 06/29/2001, 04/25/2002, 10/12/2005   HIB (PRP-T) 02/08/2001, 05/17/2001, 06/29/2001, 04/25/2002    HPV 9-valent 12/19/2014   HPV Quadrivalent 09/07/2013   Hepatitis A, Ped/Adol-2 Dose 03/10/2010, 09/07/2013   Hepatitis B, PED/ADOLESCENT 01/07/2001, 02/08/2001, 06/29/2001   IPV 02/08/2001, 05/17/2001, 02/11/2002, 10/12/2005   Influenza Split 01/11/2002, 02/13/2002, 02/25/2003   MMR 01/11/2002, 10/12/2005   Meningococcal Conjugate 09/07/2013   Pneumococcal-Unspecified 02/08/2001, 05/17/2001, 06/29/2001, 04/25/2002   Tdap 09/07/2013   Varicella 01/11/2002, 10/12/2005   CT Lung Screen - not indicated  No orders of the defined types were placed in this encounter.  No orders of the defined types were placed in this encounter.   No follow-ups on file.  I have spent a total time of 30-minutes on the day of the appointment including chart review, data review, collecting history, coordinating care and discussing medical diagnosis and plan with the patient/family. Past medical history, allergies, medications were reviewed. Pertinent imaging, labs and tests included in this note have been reviewed and interpreted independently by me.  Terri Cook Mechele Collin, MD [facility] 09/17/2022 11:11 AM  Office Number [phone]

## 2022-09-17 NOTE — Progress Notes (Signed)
____________________________________________________________  Attending physician addendum:  Thank you for sending this case to me. I have reviewed the entire note and agree with the plan.  I recommend an empiric course of rifaximin (or metronidazole if rifaximin is cost prohibitive) for possible SIBO.  If that does not help, then she should be scheduled for a colonoscopy with me.  Amada Jupiter, MD  ____________________________________________________________

## 2022-09-17 NOTE — Patient Instructions (Addendum)
Dicyclomine 10 mg twice daily as needed for abdominal pain # 60 , 1 refill  Add Lomotil  twice daily as needed for greater than 3 to 4 loose bowel movements / day Continue Welchol 3  twice daily.  Call with condition update in a couple of weeks.  _______________________________________________________  If your blood pressure at your visit was 140/90 or greater, please contact your primary care physician to follow up on this.  _______________________________________________________  If you are age 79 or younger, your body mass index should be between 19-25. Your Body mass index is 53.56 kg/m. If this is out of the aformentioned range listed, please consider follow up with your Primary Care Provider.   __________________________________________________________  The [facility] providers would like to encourage you to use [facility] to communicate with providers for non-urgent requests or questions.  Due to long hold times on the telephone, sending your provider a message by [facility] may be a faster and more efficient way to get a response.  Please allow 48 business hours for a response.  Please remember that this is for non-urgent requests.  Due to recent changes in healthcare laws, you may see the results of your imaging and laboratory studies on MyChart before your provider has had a chance to review them.  We understand that in some cases there may be results that are confusing or concerning to you. Not all laboratory results come back in the same time frame and the provider may be waiting for multiple results in order to interpret others.  Please give [place] 48 hours in order for your provider to thoroughly review all the results before contacting the office for clarification of your results.     Thank you for choosing me and [facility].  Willette Cluster, NP.

## 2022-09-21 ENCOUNTER — Other Ambulatory Visit: Payer: Self-pay

## 2022-09-21 ENCOUNTER — Telehealth: Payer: Self-pay

## 2022-09-21 MED ORDER — RIFAXIMIN 550 MG PO TABS: 550.0000 mg | ORAL_TABLET | Freq: Three times a day (TID) | ORAL | 0 refills | Status: AC

## 2022-09-21 NOTE — Telephone Encounter (Signed)
-----   Message from Meredith Pel, NP sent at 09/21/2022  9:09 AM EDT ----- That is fine about the appointment.  Just have her call us after the antibiotics and if she is not better then we can proceed with a colonoscopy.  Also I saw your note about her wanting Diflucan.  Xifaxan generally stays in the bowel and does not typically cause yeast infection but if she does get a yeast infection then I am happy to call in some Diflucan.  If she gets Flagyl instead then that usually does not cause yeast infection either but again I am happy to call in Diflucan if she does get one.   Thanks ----- Message ----- From: Evalee Jefferson, LPN Sent: 1/61/0960   8:41 AM EDT To: Meredith Pel, NP  I can take care of everything except the appointment. Everything has a 7 day hold.  ----- Message ----- From: Meredith Pel, NP Sent: 09/20/2022   5:33 PM EDT To: Evalee Jefferson, LPN  Beth,  Dr. Myrtie Neither wants this patient to have a course of antibiotics.  I have already called the patient and spoken with her and she will expect your call.  This is what he wrote on my note    "I recommend an empiric course of rifaximin (or metronidazole if rifaximin is cost prohibitive) for possible SIBO.  If that does not help, then she should be scheduled for a colonoscopy with me"     Beth, please ask her to call after the course of antibiotics  with condition update. If not better then needs colonoscopy.   Please go ahead and get her a follow-up appointment on the books to see me in about 6 weeks.  This can always be canceled if she ends up getting a colonoscopy.   Thanks, pg

## 2022-09-21 NOTE — Telephone Encounter (Signed)
Spoke with the patient. Advised of the plan of care. She agrees to this plan. Questions invited. Confirmed she will continue with the plan as discussed at her recent office visit. The new antibiotic will be in addition. She agrees to give [place] an update of her symptoms after completion of the SIBO treatment.

## 2022-09-27 ENCOUNTER — Telehealth: Payer: Self-pay | Admitting: Internal Medicine

## 2022-09-27 IMAGING — CR DG CHEST 2V
2 series · 2 of 2 positions shown · non-contrast
Comparison: None.

CLINICAL DATA: Shortness of breath

EXAM:
CHEST - 2 VIEW

[chest pa]
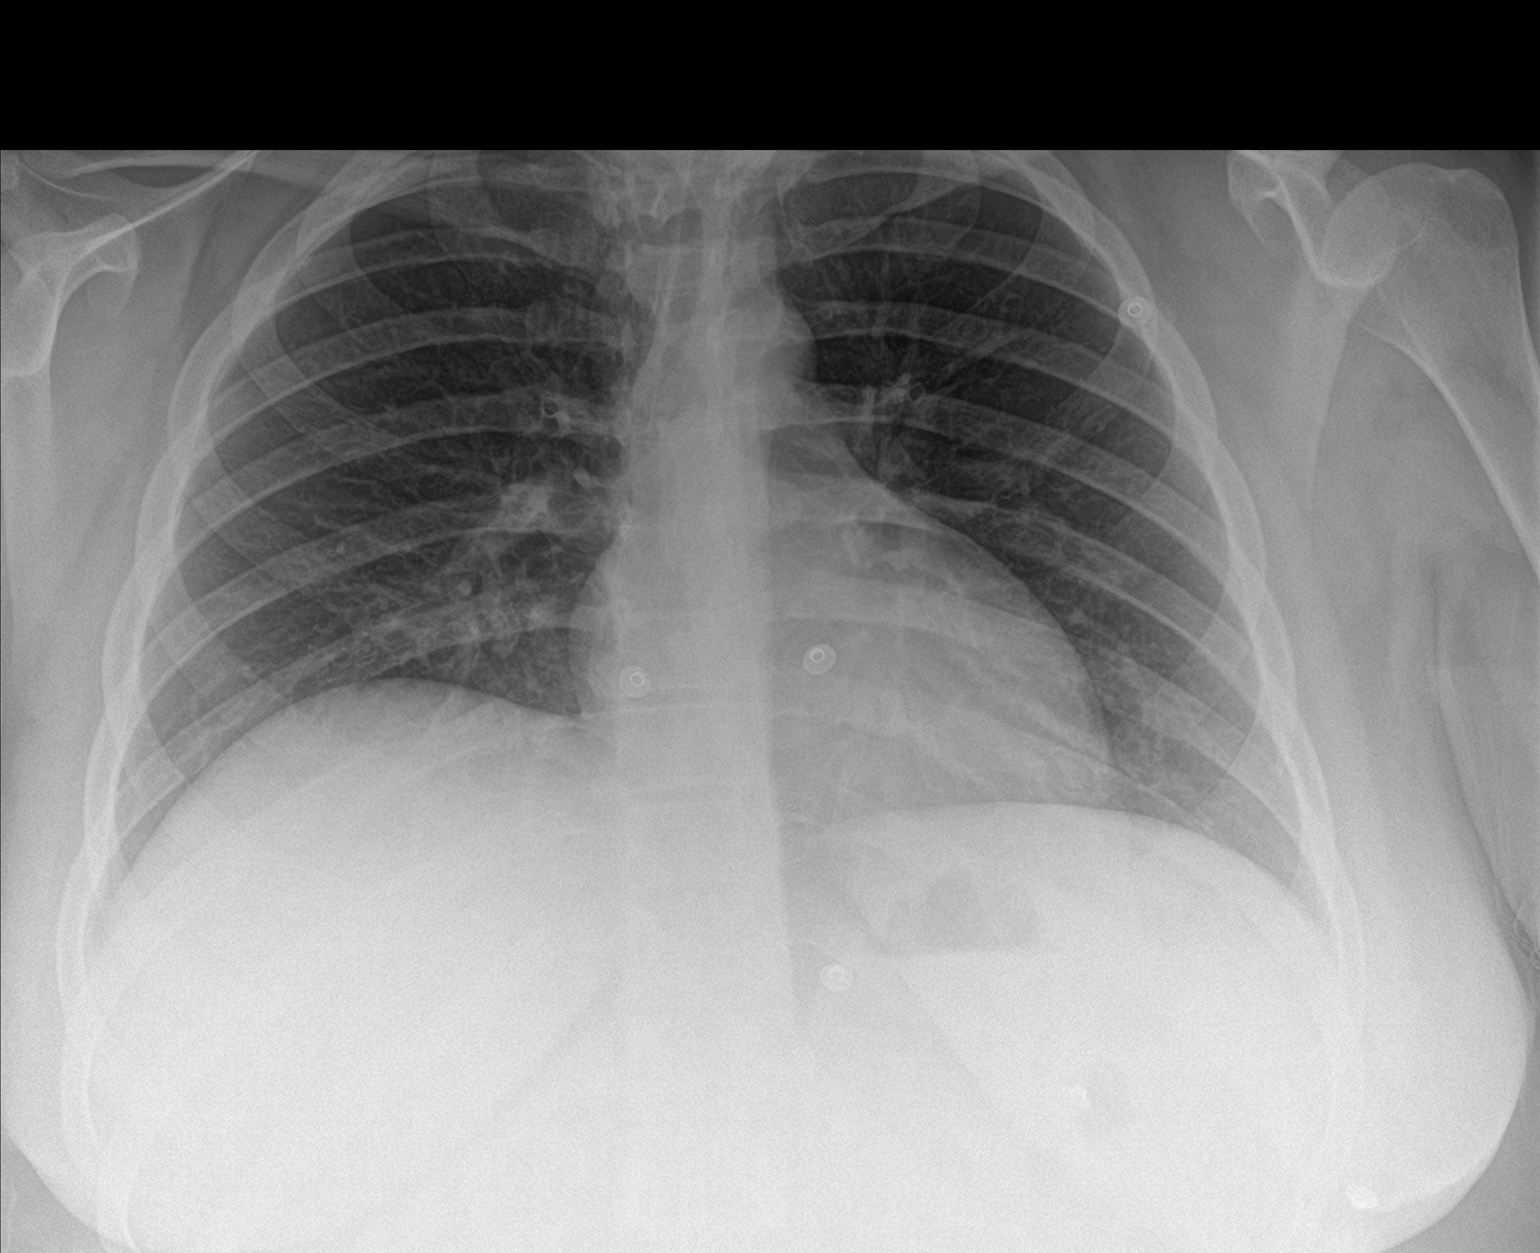

[chest lat]
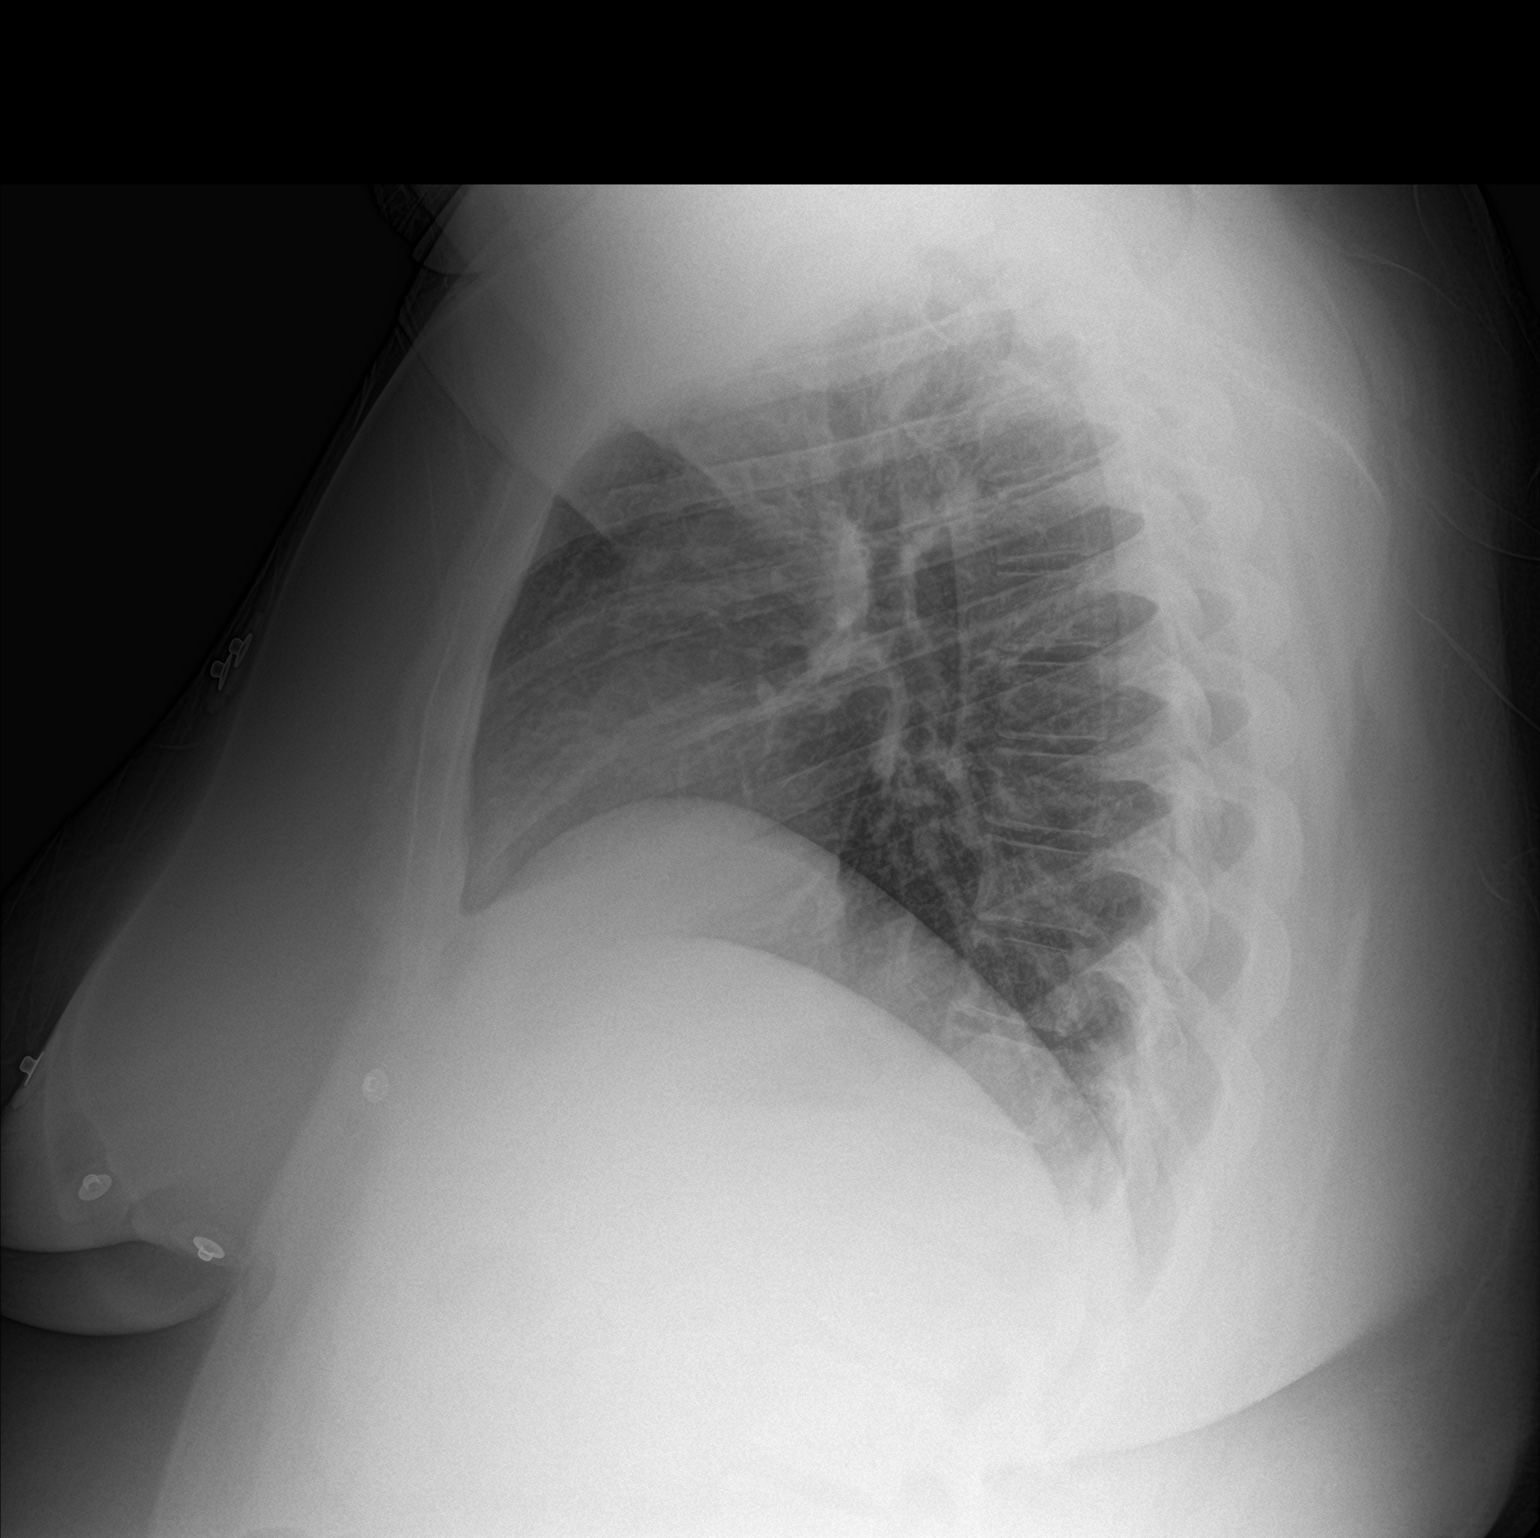

[2 of 2 positions shown; findings below may reference images not displayed]

FINDINGS: Low lung volumes. No consolidation or edema. No pleural effusion. No
pneumothorax. Cardiomediastinal contours are within normal limits
with normal heart size. No acute osseous abnormality.
IMPRESSION: No active cardiopulmonary disease.

## 2022-09-27 NOTE — Telephone Encounter (Signed)
Called patient regarding July appointments, patient is notified.  

## 2022-10-04 ENCOUNTER — Telehealth: Payer: Self-pay | Admitting: Nurse Practitioner

## 2022-10-04 MED ORDER — COLESEVELAM HCL 625 MG PO TABS: 1875.0000 mg | ORAL_TABLET | Freq: Two times a day (BID) | ORAL | 6 refills | Status: DC

## 2022-10-04 NOTE — Telephone Encounter (Signed)
Patient aware that Welchol 625mg  three tablets 2 times daily was sent to [facility] pharmacy as requested.

## 2022-10-04 NOTE — Telephone Encounter (Signed)
Patient needing medication refill for welchol 625 mg.Please advise.

## 2022-10-06 ENCOUNTER — Telehealth: Payer: Self-pay | Admitting: Pulmonary Disease

## 2022-10-06 ENCOUNTER — Other Ambulatory Visit: Payer: Self-pay

## 2022-10-06 ENCOUNTER — Telehealth: Payer: Self-pay | Admitting: Nurse Practitioner

## 2022-10-06 MED ORDER — HYDROCORTISONE (PERIANAL) 2.5 % EX CREA: 1.0000 | TOPICAL_CREAM | Freq: Every evening | CUTANEOUS | 0 refills | Status: AC

## 2022-10-06 NOTE — Telephone Encounter (Signed)
Inbound call from patient requesting a call back from nurse she said she is having some blood in her stool .Please advise.

## 2022-10-06 NOTE — Telephone Encounter (Signed)
Patient reports she had a soft formed bowel movement after eating her midday meal. When she stood to flush she noticed with stool had what she thought were blood clots in it. It was not bright red. Described as "what I would see if I were starting my period."  She did not have blood on the tissue. She has not seen blood since this. No rectal pain. "I had pain in my stomach during the bowel movement like always."  She is completing the Xifaxan for possible SIBO. Please advise.

## 2022-10-06 NOTE — Telephone Encounter (Signed)
She does feel it is helping with her symptoms.

## 2022-10-06 NOTE — Telephone Encounter (Signed)
Spoke with the patient. She does feel the Xifaxan is helping with her symptoms. To the issue of possible blood in the stool; She agrees to anusol cream nightly applied into rectum for 10 days. She will complete the Xifaxan as directed and continue her other medications as directed. She agrees to contact us if she sees more blood in her stool or has any concerns. We will schedule her a follow up appointment  (after the "7 day holds" on the appointments release.)

## 2022-10-06 NOTE — Telephone Encounter (Signed)
Patient unable to pay for OOP costs. Please cancel sleep home study order.

## 2022-10-06 NOTE — Telephone Encounter (Signed)
Patient states insurance does not cover home sleep test. Patient phone number is [phone].

## 2022-10-06 NOTE — Telephone Encounter (Signed)
Patient called to follow up on previous message, please advise.  

## 2022-10-15 NOTE — Telephone Encounter (Signed)
Inbound call from patient , wanting to know if Gunnar Fusi wants her to continue taking Dicyclomine.Please advise

## 2022-10-15 NOTE — Telephone Encounter (Signed)
Phone call to the patient who stated dicyclomine has been effective and is helping with stomach spasms and abdominal cramping.  Patient was advised that she should continue dicyclomine until she follows up in the office on 11-04-22 with [facility].  Patient agreed to plan and verbalized understanding.  No further questions.

## 2022-10-15 NOTE — Progress Notes (Signed)
[facility] OFFICE PROGRESS NOTE  Terri Saint, MD [address] [place] [address]  DIAGNOSIS: Iron deficiency anemia   PRIOR THERAPY: None  CURRENT THERAPY:  1) PRN IV iron, last dose on 11/13/21  2) OTC iron supplement p.o. every other day  INTERVAL HISTORY: Terri Cook 22 y.o. female returns to the clinic today for a follow-up visit.  The patient was first seen in the clinic on 02/01/23.  The patient had new onset anemia first seen in February 2023.  However, her Hbg has been normal/elevated for about 1 year now. She has not needed any additional IV iron since last year. At her last appointment, her iron studiers were acceptable. We decided to only take iron when she has menstrual cycles. She had two menstrual cycles since last being.   She has asthma and sees pulmonary medicine. She has chronic shortness of breath. She also has associated allergies. She also reports snoring. Given her polycythemia, snoring, and obesity, pulmonary medicine recommended sleep test, which we are in agreement with. However, she cannot afford this test, and therefore, has not had this performed.   She also follows with GI for chronic diarrhea and right sided abdominal pain. She was started on dicyclomaine which has been effective. She had some blood in her stool a few times which she is being treated for possible hemorrhoids. She is expected to follow up with them in August on 11/04/22. When she sees then next time, they will decide if colonoscopy or endoscopy is needed.   The patient reports she is fair. She feels like her iron is low because she is having more fatigue and craving ice.    She denies any other abnormal bleeding including epistaxis, gingival bleeding, hemoptysis, or hematemesis besides the hematochezia. She does report she does eat red meat frequently. She is here today for evaluation and repeat blood work.   MEDICAL HISTORY: Past Medical History:  Diagnosis  Date   Abnormal uterine bleeding    Acid reflux    Anemia    Asthma    Fatty liver    High blood pressure    High cholesterol    Ingrown left big toenail 09/2016   recurrent   PCOS (polycystic ovarian syndrome)    Pre-diabetes    Seasonal allergies     ALLERGIES:  is allergic to clindamycin/lincomycin.  MEDICATIONS:  Current Outpatient Medications  Medication Sig Dispense Refill   Acetaminophen-Caff-Pyrilamine (MIDOL MAX ST MENSTRUAL PO) Take 2 tablets by mouth 2 (two) times daily as needed (cramps).     ADVAIR HFA 230-21 MCG/ACT inhaler Inhale 2 puffs into the lungs 2 (two) times daily. 12 g 5   albuterol (VENTOLIN HFA) 108 (90 Base) MCG/ACT inhaler Inhale 2 puffs into the lungs every 6 (six) hours as needed for wheezing or shortness of breath. 8 g 6   aspirin-acetaminophen-caffeine (EXCEDRIN EXTRA STRENGTH) 250-250-65 MG tablet Take 2 tablets by mouth every 8 (eight) hours as needed for headache or migraine.     BALZIVA 0.4-35 MG-MCG tablet Take 1 tablet by mouth daily. 84 tablet 3   cetirizine (ZYRTEC) 10 MG tablet Take 10 mg by mouth daily as needed for allergies.     cholecalciferol (VITAMIN D3) 25 MCG (1000 UNIT) tablet Take 1,000 Units by mouth daily.     colesevelam (WELCHOL) 625 MG tablet Take 3 tablets (1,875 mg total) by mouth 2 (two) times daily with a meal. Do not take within 4 hours of other  medicines 120 tablet 6   dicyclomine (BENTYL) 10 MG capsule Take 1 capsule (10 mg total) by mouth 2 (two) times daily as needed for spasms. 60 capsule 1   diphenoxylate-atropine (LOMOTIL) 2.5-0.025 MG tablet Take 1 tablet by mouth 2 (two) times daily as needed for diarrhea or loose stools. 30 tablet 0   FeFum-FePoly-FA-B Cmp-C-Biot (INTEGRA PLUS) CAPS Take 1 capsule by mouth every morning. 30 capsule 2   ibuprofen (ADVIL,MOTRIN) 200 MG tablet Take 400 mg by mouth every 6 (six) hours as needed for mild pain or headache.     lactase (LACTAID) 3000 units tablet Take by mouth 3 (three)  times daily with meals.     valACYclovir (VALTREX) 500 MG tablet Take one tablet po BID x 3 days with an outbreak 30 tablet 2   Vitamin D, Ergocalciferol, (DRISDOL) 1.25 MG (50000 UNIT) CAPS capsule Take 1 capsule (50,000 Units total) by mouth every 7 (seven) days. 5 capsule 0   zinc gluconate 50 MG tablet Take 50 mg by mouth daily.     No current facility-administered medications for this visit.    SURGICAL HISTORY:  Past Surgical History:  Procedure Laterality Date   GALLBLADDER SURGERY  2021   LESION EXCISION Left 07/11/2014   Procedure: EXCISION OF BENIGN LESIONS INCLUDING MARGINS ;  Surgeon: Leonia Corona, MD;  Location: [facility];  Service: Pediatrics;  Laterality: Left;  mid back  and right neck   TOENAIL EXCISION  01/27/2012   Procedure: MINOR TOENAIL EXCISION;  Surgeon: Judie Petit. Leonia Corona, MD;  Location: [facility];  Service: Pediatrics;  Laterality: Left;   TOENAIL EXCISION  03/23/2012   Procedure: MINOR TOENAIL EXCISION;  Surgeon: Judie Petit. Leonia Corona, MD;  Location: [facility];  Service: Pediatrics;  Laterality: Right;   TOENAIL EXCISION Right 09/21/2012   Procedure: Partial Excision of Right Toenail;  Surgeon: Judie Petit. Leonia Corona, MD;  Location: [facility];  Service: Pediatrics;  Laterality: Right;   TOENAIL EXCISION Left 02/15/2013   Procedure: PARTIAL TOENAIL EXCISION ON THE LEFT BIG TOE AT THE OUTER LATERAL FOLD WITH PHENOL (MP ROOM) ;  Surgeon: Judie Petit. Leonia Corona, MD;  Location: [facility];  Service: Pediatrics;  Laterality: Left;   TOENAIL EXCISION Left 08/16/2013   Procedure: PARTIAL EXCISION OF LEFT BIG TOE NAIL FROM OUTER LATERAL FOLD;  Surgeon: Judie Petit. Leonia Corona, MD;  Location: [facility];  Service: Pediatrics;  Laterality: Left;   TOENAIL EXCISION Left 09/16/2016   Procedure: PARTIAL PERMANENT EXCISION OF LEFT INGROWN BIG TOENAIL;  Surgeon: Leonia Corona, MD;  Location: [facility];  Service: General;  Laterality: Left;   WISDOM TOOTH EXTRACTION  12/08/2018   all 4    REVIEW OF SYSTEMS:   Constitutional: Positive for fatigue.  Negative for appetite change, chills, fever and unexpected weight change.  HENT: Negative for mouth sores, nosebleeds, sore throat and trouble swallowing.   Eyes: Negative for eye problems and icterus.  Respiratory: Negative for cough, hemoptysis, shortness of breath and wheezing.   Cardiovascular: Negative for chest pain and leg swelling.  Gastrointestinal: Positive for occasional rectal bleeding. Negative for abdominal pain, constipation, diarrhea, nausea and vomiting.  Genitourinary: Negative for bladder incontinence, difficulty urinating, dysuria, frequency and hematuria.   Musculoskeletal: Negative for back pain, gait problem, neck pain and neck stiffness.  Skin: Negative for itching and rash.  Neurological: Negative for dizziness, extremity weakness, gait problem, headaches, light-headedness and seizures.  Hematological: Negative for  adenopathy. Does not bruise/bleed easily.  Psychiatric/Behavioral: Negative for confusion, depression and sleep disturbance. The patient is not nervous/anxious.     PHYSICAL EXAMINATION:  Blood pressure (!) 116/44, pulse 96, temperature 97.8 F (36.6 C), temperature source Oral, resp. rate 16, weight (!) 346 lb 12.8 oz (157.3 kg), SpO2 99%.  ECOG PERFORMANCE STATUS: 1  Physical Exam  Constitutional: Oriented to person, place, and time and well-developed, well-nourished, and in no distress. HENT:  Head: Normocephalic and atraumatic.  Mouth/Throat: Oropharynx is clear and moist. No oropharyngeal exudate.  Eyes: Conjunctivae are normal. Right eye exhibits no discharge. Left eye exhibits no discharge. No scleral icterus.  Neck: Normal range of motion. Neck supple.  Cardiovascular: Normal rate, regular rhythm, normal heart sounds and intact distal pulses.   Pulmonary/Chest: Effort normal and  breath sounds normal. No respiratory distress. No wheezes. No rales.  Abdominal: Soft. Bowel sounds are normal. Exhibits no distension and no mass. There is no tenderness.  Musculoskeletal: Normal range of motion. Exhibits no edema.  Lymphadenopathy:    No cervical adenopathy.  Neurological: Alert and oriented to person, place, and time. Exhibits normal muscle tone. Gait normal. Coordination normal.  Skin: Skin is warm and dry. No rash noted. Not diaphoretic. No erythema. No pallor.  Psychiatric: Mood, memory and judgment normal.  Vitals reviewed.  LABORATORY DATA: Lab Results  Component Value Date   WBC 10.3 10/19/2022   HGB 15.3 (H) 10/19/2022   HCT 43.3 10/19/2022   MCV 81.2 10/19/2022   PLT 355 10/19/2022      Chemistry      Component Value Date/Time   NA 139 10/22/2021 1248   NA 138 09/30/2021 1150   K 3.9 10/22/2021 1248   CL 104 10/22/2021 1248   CO2 29 10/22/2021 1248   BUN 6 10/22/2021 1248   BUN 7 09/30/2021 1150   CREATININE 0.67 10/22/2021 1248   CREATININE 0.79 09/27/2017 0000      Component Value Date/Time   CALCIUM 9.4 10/22/2021 1248   ALKPHOS 76 10/22/2021 1248   AST 19 10/22/2021 1248   ALT 36 10/22/2021 1248   BILITOT 0.7 10/22/2021 1248       RADIOGRAPHIC STUDIES:  No results found.   ASSESSMENT/PLAN:  This is a very pleasant 22 year old female referred to clinic for iron deficiency without anemia.    She only is taking iron tablets when she is menstruating.    The patient had a repeat CBC, iron studies, and ferritin drawn today.  The patient's labs show increased but improved compared to prior hemoglobin of 15.3 today.  The patient was seen with Dr. Arbutus Ped. This is likely reactive possibly related to not having a menstrual cycle, high iron intake, birth control use, and asthma.   Her iron studies and ferritin are pending at this time. I will call her with the results.    Unlikely the patient will require IV iron unless she has significant  iron deficiency on labs. Her iron studies are pending. I will call her with the results.   Dr. Arbutus Ped states we can see her on an as needed basis.   Recommend she continue to follow with GI for her intermittent rectal bleeding.    The patient was advised to call immediately if he has any concerning symptoms in the interval. The patient voices understanding of current disease status and treatment options and is in agreement with the current care plan. All questions were answered. The patient knows to call the clinic with any problems,  questions or concerns. We can certainly see the patient much sooner if necessary     No orders of the defined types were placed in this encounter.    Quantavious Eggert L Abrial Arrighi, PA-C 10/19/22  ADDENDUM: Hematology/Oncology Attending: I had a face-to-face encounter with the patient today.  I reviewed her records, lab and recommended her care plan.  This is a very pleasant 22 years old white female with history of iron deficiency anemia secondary to menstrual blood loss status post iron infusion more than a year ago.  The patient has been on over-the-counter iron supplement every other day and tolerating it fairly well. She also has a history of asthma as well as suspicious sleep apnea. She had repeat CBC today that showed hemoglobin of 15.3 and hematocrit 43.3% with normal total white blood count and platelets count.  Iron studies showed normal serum iron of 49 but slightly low iron saturation of 10% and elevated TIBC of 483.  Ferritin level is still pending. I recommended for the patient to continue her oral iron tablet as prescribed and follow-up with her primary care physician from now on. We will be happy to see her in the future if she has significant anemia or iron deficiency. The patient was advised to call immediately if she has any other concerning symptoms in the interval. Disclaimer: This note was dictated with voice recognition software. Similar  sounding words can inadvertently be transcribed and may be missed upon review. Lajuana Matte, MD

## 2022-10-18 ENCOUNTER — Other Ambulatory Visit: Payer: Self-pay | Admitting: Medical Oncology

## 2022-10-18 DIAGNOSIS — E611 Iron deficiency: Secondary | ICD-10-CM

## 2022-10-19 ENCOUNTER — Inpatient Hospital Stay (HOSPITAL_BASED_OUTPATIENT_CLINIC_OR_DEPARTMENT_OTHER): Payer: Managed Care, Other (non HMO) | Admitting: Physician Assistant

## 2022-10-19 ENCOUNTER — Inpatient Hospital Stay: Payer: Managed Care, Other (non HMO) | Attending: Physician Assistant

## 2022-10-19 ENCOUNTER — Other Ambulatory Visit: Payer: Self-pay | Admitting: *Deleted

## 2022-10-19 ENCOUNTER — Other Ambulatory Visit: Payer: Self-pay

## 2022-10-19 VITALS — BP 116/44 | HR 96 | Temp 97.8°F | Resp 16 | Wt 346.8 lb

## 2022-10-19 DIAGNOSIS — R0683 Snoring: Secondary | ICD-10-CM | POA: Diagnosis not present

## 2022-10-19 DIAGNOSIS — J45909 Unspecified asthma, uncomplicated: Secondary | ICD-10-CM | POA: Diagnosis not present

## 2022-10-19 DIAGNOSIS — D509 Iron deficiency anemia, unspecified: Secondary | ICD-10-CM

## 2022-10-19 DIAGNOSIS — R0602 Shortness of breath: Secondary | ICD-10-CM | POA: Diagnosis not present

## 2022-10-19 DIAGNOSIS — K921 Melena: Secondary | ICD-10-CM | POA: Insufficient documentation

## 2022-10-19 DIAGNOSIS — Z881 Allergy status to other antibiotic agents status: Secondary | ICD-10-CM | POA: Diagnosis not present

## 2022-10-19 DIAGNOSIS — R5383 Other fatigue: Secondary | ICD-10-CM | POA: Insufficient documentation

## 2022-10-19 DIAGNOSIS — D751 Secondary polycythemia: Secondary | ICD-10-CM | POA: Diagnosis not present

## 2022-10-19 DIAGNOSIS — E669 Obesity, unspecified: Secondary | ICD-10-CM | POA: Insufficient documentation

## 2022-10-19 DIAGNOSIS — K625 Hemorrhage of anus and rectum: Secondary | ICD-10-CM | POA: Diagnosis not present

## 2022-10-19 DIAGNOSIS — Z79624 Long term (current) use of inhibitors of nucleotide synthesis: Secondary | ICD-10-CM | POA: Diagnosis not present

## 2022-10-19 DIAGNOSIS — Z79899 Other long term (current) drug therapy: Secondary | ICD-10-CM | POA: Insufficient documentation

## 2022-10-19 DIAGNOSIS — E611 Iron deficiency: Secondary | ICD-10-CM

## 2022-10-19 LAB — IRON AND IRON BINDING CAPACITY (CC-WL,HP ONLY)
Iron: 49 ug/dL (ref 28–170)
Saturation Ratios: 10 % — ABNORMAL LOW (ref 10.4–31.8)
TIBC: 483 ug/dL — ABNORMAL HIGH (ref 250–450)
UIBC: 434 ug/dL (ref 148–442)

## 2022-10-19 LAB — CBC WITH DIFFERENTIAL (CANCER CENTER ONLY)
Abs Immature Granulocytes: 0.06 10*3/uL (ref 0.00–0.07)
Basophils Absolute: 0.1 10*3/uL (ref 0.0–0.1)
Basophils Relative: 1 %
Eosinophils Absolute: 0.3 10*3/uL (ref 0.0–0.5)
Eosinophils Relative: 3 %
HCT: 43.3 % (ref 36.0–46.0)
Hemoglobin: 15.3 g/dL — ABNORMAL HIGH (ref 12.0–15.0)
Immature Granulocytes: 1 %
Lymphocytes Relative: 20 %
Lymphs Abs: 2 10*3/uL (ref 0.7–4.0)
MCH: 28.7 pg (ref 26.0–34.0)
MCHC: 35.3 g/dL (ref 30.0–36.0)
MCV: 81.2 fL (ref 80.0–100.0)
Monocytes Absolute: 0.8 10*3/uL (ref 0.1–1.0)
Monocytes Relative: 8 %
Neutro Abs: 7.1 10*3/uL (ref 1.7–7.7)
Neutrophils Relative %: 67 %
Platelet Count: 355 10*3/uL (ref 150–400)
RBC: 5.33 MIL/uL — ABNORMAL HIGH (ref 3.87–5.11)
RDW: 11.9 % (ref 11.5–15.5)
WBC Count: 10.3 10*3/uL (ref 4.0–10.5)
nRBC: 0 % (ref 0.0–0.2)

## 2022-10-19 LAB — FERRITIN: Ferritin: 32 ng/mL (ref 11–307)

## 2022-10-20 ENCOUNTER — Ambulatory Visit: Payer: Managed Care, Other (non HMO) | Admitting: Physician Assistant

## 2022-10-20 ENCOUNTER — Other Ambulatory Visit: Payer: Managed Care, Other (non HMO)

## 2022-10-21 ENCOUNTER — Other Ambulatory Visit: Payer: Managed Care, Other (non HMO)

## 2022-10-21 ENCOUNTER — Encounter: Payer: Self-pay | Admitting: Physician Assistant

## 2022-10-21 ENCOUNTER — Ambulatory Visit: Payer: Managed Care, Other (non HMO) | Admitting: Physician Assistant

## 2022-10-27 NOTE — Telephone Encounter (Signed)
Appointment on 11/04/22. Patient is aware. The appointment was made 10/06/22.

## 2022-11-04 ENCOUNTER — Encounter: Payer: Self-pay | Admitting: Nurse Practitioner

## 2022-11-04 ENCOUNTER — Ambulatory Visit: Payer: Managed Care, Other (non HMO) | Admitting: Nurse Practitioner

## 2022-11-04 VITALS — BP 104/80 | HR 80 | Ht 67.0 in | Wt 347.0 lb

## 2022-11-04 DIAGNOSIS — K529 Noninfective gastroenteritis and colitis, unspecified: Secondary | ICD-10-CM | POA: Diagnosis not present

## 2022-11-04 NOTE — Progress Notes (Signed)
Primary GI: Amada Jupiter, MD   ASSESSMENT & PLAN   Brief Narrative:  22 y.o.  female whose past medical history includes,  but is not necessarily limited to, lactose intolerance, chronic diarrhea, morbid obesity, PCOS, asthma, menorrhagia , iron deficiency anemia postcholecystectomy.   Chronic diarrhea / Lactose intolerance / Possible bile acid related diarrhea . Seen in June with acute worsening of chronic diarrhea and also abdominal pain following what sounds like gastroenteritis in April. Also recent blood in stool.  -Diarrhea resolving following after a course of Xifaxan and an increase in WelChol dose. No further bleeding and abdominal pain has resolved. At baseline she has 3-4 semiformed bowel movements.  Though stools remain on the loose side, the frequency of BM iis nearly back to baseline.  -- Given improvement we will hold off on further workup for now.   -- Continue WelChol 3 twice daily .  -- She will call with a condition update in 3 to 4 weeks.  If things have not continued to improve and/or there is any further bleeding then we will arrange for colonoscopy. If doing well then she may be able to decrease Welchol dose  Iron deficiency anemia,  ? 2/2 menorrhagia .  Followed by Hematology.  Receives IV iron as needed. Hgb 15.3 mid July   HPI   Brief GI history Terri Cook has been previously evaluated here for diarrhea.  Infectious workup negative up . She discovered that she was lactose intolerant.  Also felt to have a component of bile acid diarrhea postcholecystectomy.  She was seen here mid June with worsening diarrhea and abdominal pain after a bout of what sounds like gastroenteritis in April.  She was given a trial of dicyclomine for the abdominal pain.  WelChol was increased to 3 twice daily and Lomotil was added to take as needed.  Subsequently through phone messages she was given a trial of Xifaxan for possible SIBO.  Though that did help with the diarrhea there was not much  improvement in that abdominal pain and then she began seeing some blood in her stool.  Treated  empirically for hemorrhoids and given follow up appt.    Interval History    Chief complaint : Follow-up on diarrhea and abdominal pain  Terri Cook feels like the Xifaxan did help.  She is over 50% better as far as the diarrhea.  At baseline she usually has about 4 semisolid bowel movements a day.  Currently she is having 4-5 bowel movements a day though the consistency is still on the low side.  There has been no further bleeding.  Abdominal pain has resolved.  She is trying to lose weight, concerned about her body image    Previous GI Endoscopies / Labs / Imaging       Latest Ref Rng & Units 10/22/2021   12:48 PM 09/30/2021   11:50 AM 03/16/2021    2:51 PM  Hepatic Function  Total Protein 6.5 - 8.1 g/dL 7.2  7.2  6.4   Albumin 3.5 - 5.0 g/dL 4.2  4.6  3.4   AST 15 - 41 U/L 19  29  28    ALT 0 - 44 U/L 36  52  49   Alk Phosphatase 38 - 126 U/L 76  89  64   Total Bilirubin 0.3 - 1.2 mg/dL 0.7  1.0  0.6        Latest Ref Rng & Units 10/19/2022    9:39 AM 07/22/2022    1:55 PM  05/31/2022    3:27 PM  CBC  WBC 4.0 - 10.5 K/uL 10.3  9.0  9.3   Hemoglobin 12.0 - 15.0 g/dL 16.1  09.6  04.5   Hematocrit 36.0 - 46.0 % 43.3  44.4  44.5   Platelets 150 - 400 K/uL 355  408  414.0      Past Medical History:  Diagnosis Date   Abnormal uterine bleeding    Acid reflux    Anemia    Asthma    Fatty liver    High blood pressure    High cholesterol    Ingrown left big toenail 09/2016   recurrent   PCOS (polycystic ovarian syndrome)    Pre-diabetes    Seasonal allergies     Past Surgical History:  Procedure Laterality Date   GALLBLADDER SURGERY  2021   LESION EXCISION Left 07/11/2014   Procedure: EXCISION OF BENIGN LESIONS INCLUDING MARGINS ;  Surgeon: Leonia Corona, MD;  Location: [facility];  Service: Pediatrics;  Laterality: Left;  mid back  and right neck   TOENAIL EXCISION   01/27/2012   Procedure: MINOR TOENAIL EXCISION;  Surgeon: Judie Petit. Leonia Corona, MD;  Location: [facility];  Service: Pediatrics;  Laterality: Left;   TOENAIL EXCISION  03/23/2012   Procedure: MINOR TOENAIL EXCISION;  Surgeon: Judie Petit. Leonia Corona, MD;  Location: [facility];  Service: Pediatrics;  Laterality: Right;   TOENAIL EXCISION Right 09/21/2012   Procedure: Partial Excision of Right Toenail;  Surgeon: Judie Petit. Leonia Corona, MD;  Location: [facility];  Service: Pediatrics;  Laterality: Right;   TOENAIL EXCISION Left 02/15/2013   Procedure: PARTIAL TOENAIL EXCISION ON THE LEFT BIG TOE AT THE OUTER LATERAL FOLD WITH PHENOL (MP ROOM) ;  Surgeon: Judie Petit. Leonia Corona, MD;  Location: [facility];  Service: Pediatrics;  Laterality: Left;   TOENAIL EXCISION Left 08/16/2013   Procedure: PARTIAL EXCISION OF LEFT BIG TOE NAIL FROM OUTER LATERAL FOLD;  Surgeon: Judie Petit. Leonia Corona, MD;  Location: [facility];  Service: Pediatrics;  Laterality: Left;   TOENAIL EXCISION Left 09/16/2016   Procedure: PARTIAL PERMANENT EXCISION OF LEFT INGROWN BIG TOENAIL;  Surgeon: Leonia Corona, MD;  Location: [facility];  Service: General;  Laterality: Left;   WISDOM TOOTH EXTRACTION  12/08/2018   all 4    Family History  Problem Relation Age of Onset   Asthma Mother    Obesity Mother    Diabetes Father    Hypertension Father    Asthma Father    High Cholesterol Father    Obesity Father    Cervical cancer Maternal Grandmother    Cirrhosis Paternal Grandmother        caused by medication   Diabetes Paternal Grandmother     Current Medications, Allergies, Family History and Social History were reviewed in [employer] electronic medical record.     Current Outpatient Medications  Medication Sig Dispense Refill   Acetaminophen-Caff-Pyrilamine (MIDOL MAX ST MENSTRUAL PO) Take 2 tablets by mouth 2 (two) times daily as needed  (cramps).     ADVAIR HFA 230-21 MCG/ACT inhaler Inhale 2 puffs into the lungs 2 (two) times daily. 12 g 5   albuterol (VENTOLIN HFA) 108 (90 Base) MCG/ACT inhaler Inhale 2 puffs into the lungs every 6 (six) hours as needed for wheezing or shortness of breath. 8 g 6   aspirin-acetaminophen-caffeine (EXCEDRIN EXTRA STRENGTH) 250-250-65 MG tablet Take 2 tablets by mouth every 8 (  eight) hours as needed for headache or migraine.     BALZIVA 0.4-35 MG-MCG tablet Take 1 tablet by mouth daily. 84 tablet 3   cetirizine (ZYRTEC) 10 MG tablet Take 10 mg by mouth daily as needed for allergies.     cholecalciferol (VITAMIN D3) 25 MCG (1000 UNIT) tablet Take 1,000 Units by mouth daily.     colesevelam (WELCHOL) 625 MG tablet Take 3 tablets (1,875 mg total) by mouth 2 (two) times daily with a meal. Do not take within 4 hours of other medicines 120 tablet 6   dicyclomine (BENTYL) 10 MG capsule Take 1 capsule (10 mg total) by mouth 2 (two) times daily as needed for spasms. 60 capsule 1   diphenoxylate-atropine (LOMOTIL) 2.5-0.025 MG tablet Take 1 tablet by mouth 2 (two) times daily as needed for diarrhea or loose stools. 30 tablet 0   FeFum-FePoly-FA-B Cmp-C-Biot (INTEGRA PLUS) CAPS Take 1 capsule by mouth every morning. 30 capsule 2   ibuprofen (ADVIL,MOTRIN) 200 MG tablet Take 400 mg by mouth every 6 (six) hours as needed for mild pain or headache.     lactase (LACTAID) 3000 units tablet Take by mouth 3 (three) times daily with meals.     valACYclovir (VALTREX) 500 MG tablet Take one tablet po BID x 3 days with an outbreak 30 tablet 2   Vitamin D, Ergocalciferol, (DRISDOL) 1.25 MG (50000 UNIT) CAPS capsule Take 1 capsule (50,000 Units total) by mouth every 7 (seven) days. 5 capsule 0   zinc gluconate 50 MG tablet Take 50 mg by mouth daily.     No current facility-administered medications for this visit.    Review of Systems: No chest pain. No shortness of breath. No urinary complaints.    Physical  Exam  Wt Readings from Last 3 Encounters:  10/19/22 (!) 346 lb 12.8 oz (157.3 kg)  09/17/22 (!) 342 lb 9.6 oz (155.4 kg)  09/17/22 (!) 342 lb (155.1 kg)    BP 104/80 (BP Location: Left Arm, Patient Position: Sitting, Cuff Size: Large)   Pulse 80   Ht 5\' 7"  (1.702 m)   Wt (!) 347 lb (157.4 kg)   LMP 10/24/2022   BMI 54.35 kg/m  Constitutional:  Pleasant, obese female in no acute distress. Psychiatric: Normal mood and affect. Behavior is normal. EENT: Pupils normal.  Conjunctivae are normal. No scleral icterus. Neck supple.  Cardiovascular: Normal rate, regular rhythm.  Pulmonary/chest: Effort normal and breath sounds normal. No wheezing, rales or rhonchi. Abdominal: Soft, nondistended, nontender. Bowel sounds active throughout. There are no masses palpable. No hepatomegaly. Neurological: Alert and oriented to person place and time.   Willette Cluster, NP  11/04/2022, 8:10 AM

## 2022-11-04 NOTE — Patient Instructions (Addendum)
Continue taking Welchol 2 times daily with meals  Call in 3-4 weeks with update on how you are feeling   If your blood pressure at your visit was 140/90 or greater, please contact your primary care physician to follow up on this.  _______________________________________________________  If you are age 22 or older, your body mass index should be between 23-30. Your Body mass index is 54.35 kg/m. If this is out of the aforementioned range listed, please consider follow up with your Primary Care Provider.  If you are age 66 or younger, your body mass index should be between 19-25. Your Body mass index is 54.35 kg/m. If this is out of the aformentioned range listed, please consider follow up with your Primary Care Provider.   ________________________________________________________  The [facility] providers would like to encourage you to use [facility] to communicate with providers for non-urgent requests or questions.  Due to long hold times on the telephone, sending your provider a message by [facility] may be a faster and more efficient way to get a response.  Please allow 48 business hours for a response.  Please remember that this is for non-urgent requests.  _______________________________________________________  Thank you for entrusting me with your care and choosing [facility].  Willette Cluster NP

## 2022-11-11 NOTE — Progress Notes (Signed)
____________________________________________________________  Attending physician addendum:  Thank you for sending this case to me. I have reviewed the entire note and agree with the plan.   Candyce Gambino Danis, MD  ____________________________________________________________  

## 2022-11-26 ENCOUNTER — Telehealth: Payer: Self-pay | Admitting: Family Medicine

## 2022-11-26 DIAGNOSIS — K219 Gastro-esophageal reflux disease without esophagitis: Secondary | ICD-10-CM

## 2022-11-26 MED ORDER — OMEPRAZOLE 40 MG PO CPDR
40.0000 mg | DELAYED_RELEASE_CAPSULE | Freq: Every day | ORAL | 0 refills | Status: DC
Start: 2022-11-26 — End: 2023-06-06

## 2022-11-26 NOTE — Telephone Encounter (Signed)
Okay to give 30-day supply.  Heartburn symptoms may be returning due to new medication started by GI.  Would have patient follow-up with GI for continued symptoms.

## 2022-11-26 NOTE — Telephone Encounter (Signed)
Per Dr. Vita Barley I sent in 30 days of medication and talked to the patient to follow-up with her gastroenterologist.

## 2022-11-26 NOTE — Telephone Encounter (Signed)
Prescription Request  11/26/2022  LOV: 03/26/2022  What is the name of the medication or equipment? omeprazole (PRILOSEC) 40 MG capsule  says symptoms are returning  Have you contacted your pharmacy to request a refill? Yes   Which pharmacy would you like this sent to?  [facility] [address] [place] - [address] [place] [address] Phone: [phone] Fax: [phone]    Patient notified that their request is being sent to the clinical staff for review and that they should receive a response within 2 business days.   Please advise at Mobile [phone] (mobile)

## 2022-12-07 ENCOUNTER — Telehealth (HOSPITAL_BASED_OUTPATIENT_CLINIC_OR_DEPARTMENT_OTHER): Payer: Self-pay | Admitting: *Deleted

## 2022-12-07 NOTE — Telephone Encounter (Signed)
Pt called with concerns of bump in vaginal area and some yeast symptoms. Advised that she is not a current pt of Dr. Hyacinth [facility] and would need to reach out to [facility] where she is an active pt for advice or scheduling.

## 2022-12-10 ENCOUNTER — Ambulatory Visit (INDEPENDENT_AMBULATORY_CARE_PROVIDER_SITE_OTHER): Payer: Managed Care, Other (non HMO) | Admitting: Family Medicine

## 2022-12-10 ENCOUNTER — Encounter: Payer: Self-pay | Admitting: Family Medicine

## 2022-12-10 VITALS — BP 124/72 | HR 112 | Temp 99.0°F | Ht 67.0 in | Wt 348.0 lb

## 2022-12-10 DIAGNOSIS — H6123 Impacted cerumen, bilateral: Secondary | ICD-10-CM

## 2022-12-10 DIAGNOSIS — B379 Candidiasis, unspecified: Secondary | ICD-10-CM

## 2022-12-10 DIAGNOSIS — T3695XA Adverse effect of unspecified systemic antibiotic, initial encounter: Secondary | ICD-10-CM

## 2022-12-10 DIAGNOSIS — J069 Acute upper respiratory infection, unspecified: Secondary | ICD-10-CM

## 2022-12-10 DIAGNOSIS — J029 Acute pharyngitis, unspecified: Secondary | ICD-10-CM | POA: Diagnosis not present

## 2022-12-10 DIAGNOSIS — H66003 Acute suppurative otitis media without spontaneous rupture of ear drum, bilateral: Secondary | ICD-10-CM | POA: Diagnosis not present

## 2022-12-10 LAB — POC COVID19 BINAXNOW: SARS Coronavirus 2 Ag: NEGATIVE

## 2022-12-10 LAB — POCT INFLUENZA A/B: Influenza A, POC: NEGATIVE

## 2022-12-10 LAB — POCT RAPID STREP A (OFFICE): Rapid Strep A Screen: NEGATIVE

## 2022-12-10 MED ORDER — AMOXICILLIN-POT CLAVULANATE 500-125 MG PO TABS
1.0000 | ORAL_TABLET | Freq: Two times a day (BID) | ORAL | 0 refills | Status: AC
Start: 2022-12-10 — End: 2022-12-17

## 2022-12-10 MED ORDER — FLUCONAZOLE 150 MG PO TABS: ORAL_TABLET | ORAL | 0 refills | Status: DC

## 2022-12-10 NOTE — Progress Notes (Signed)
Established Patient Office Visit   Subjective  Patient ID: Terri Cook, female    DOB: [birth year]  Age: 22 y.o. MRN: [id number]  Chief Complaint  Patient presents with   Cough    cough/congestion/runny nose/sore throat started 2 days ago     Pt is a 22 yo female seen for acute concern.  Patient endorses cough, congestion, fever, sore throat, headache, ear pain/pressure, chills, nausea x 4 days. Pt tried Mucinex for symptoms. Possible sick contacts include her mother who was recently dx'd with AOM.  Cough    Past Medical History:  Diagnosis Date   Abnormal uterine bleeding    Acid reflux    Anemia    Asthma    Fatty liver    High blood pressure    High cholesterol    Ingrown left big toenail 09/2016   recurrent   PCOS (polycystic ovarian syndrome)    Pre-diabetes    Seasonal allergies    Past Surgical History:  Procedure Laterality Date   GALLBLADDER SURGERY  2021   LESION EXCISION Left 07/11/2014   Procedure: EXCISION OF BENIGN LESIONS INCLUDING MARGINS ;  Surgeon: Leonia Corona, MD;  Location: [facility];  Service: Pediatrics;  Laterality: Left;  mid back  and right neck   TOENAIL EXCISION  01/27/2012   Procedure: MINOR TOENAIL EXCISION;  Surgeon: Judie Petit. Leonia Corona, MD;  Location: [facility];  Service: Pediatrics;  Laterality: Left;   TOENAIL EXCISION  03/23/2012   Procedure: MINOR TOENAIL EXCISION;  Surgeon: Judie Petit. Leonia Corona, MD;  Location: [facility];  Service: Pediatrics;  Laterality: Right;   TOENAIL EXCISION Right 09/21/2012   Procedure: Partial Excision of Right Toenail;  Surgeon: Judie Petit. Leonia Corona, MD;  Location: [facility];  Service: Pediatrics;  Laterality: Right;   TOENAIL EXCISION Left 02/15/2013   Procedure: PARTIAL TOENAIL EXCISION ON THE LEFT BIG TOE AT THE OUTER LATERAL FOLD WITH PHENOL (MP ROOM) ;  Surgeon: Judie Petit. Leonia Corona, MD;  Location: [facility];  Service: Pediatrics;   Laterality: Left;   TOENAIL EXCISION Left 08/16/2013   Procedure: PARTIAL EXCISION OF LEFT BIG TOE NAIL FROM OUTER LATERAL FOLD;  Surgeon: Judie Petit. Leonia Corona, MD;  Location: [facility];  Service: Pediatrics;  Laterality: Left;   TOENAIL EXCISION Left 09/16/2016   Procedure: PARTIAL PERMANENT EXCISION OF LEFT INGROWN BIG TOENAIL;  Surgeon: Leonia Corona, MD;  Location:  SURGERY CENTER;  Service: General;  Laterality: Left;   WISDOM TOOTH EXTRACTION  12/08/2018   all 4   Social History   Tobacco Use   Smoking status: Some Days    Current packs/day: 0.25    Average packs/day: 0.3 packs/day for 2.0 years (0.5 ttl pk-yrs)    Types: Cigarettes   Smokeless tobacco: Never   Tobacco comments:    1-2 a day  Vaping Use   Vaping status: Never Used  Substance Use Topics   Alcohol use: No   Drug use: No   Family History  Problem Relation Age of Onset   Asthma Mother    Obesity Mother    Diabetes Father    Hypertension Father    Asthma Father    High Cholesterol Father    Obesity Father    Cervical cancer Maternal Grandmother    Cirrhosis Paternal Grandmother        caused by medication   Diabetes Paternal Grandmother    Allergies  Allergen Reactions   Clindamycin/Lincomycin  Anaphylaxis    Trouble breathing?      Review of Systems  Respiratory:  Positive for cough.    Negative unless stated above    Objective:     BP 124/72 (BP Location: Left Arm, Patient Position: Sitting, Cuff Size: Large)   Pulse (!) 112   Temp 99 F (37.2 C) (Oral)   Ht 5\' 7"  (1.702 m)   Wt (!) 348 lb (157.9 kg)   LMP 11/18/2022 (Approximate)   SpO2 97%   BMI 54.50 kg/m    Physical Exam Constitutional:      General: She is not in acute distress.    Appearance: Normal appearance.  HENT:     Head: Normocephalic and atraumatic.     Right Ear: Swelling and tenderness present. There is impacted cerumen. Tympanic membrane is erythematous and retracted.     Left Ear:  There is impacted cerumen. Tympanic membrane is erythematous.     Ears:     Comments: B/l canals with impacted cerumen R>L.      Nose: Nose normal.     Mouth/Throat:     Mouth: Mucous membranes are moist.  Cardiovascular:     Rate and Rhythm: Normal rate and regular rhythm.     Heart sounds: Normal heart sounds. No murmur heard.    No gallop.  Pulmonary:     Effort: Pulmonary effort is normal. No respiratory distress.     Breath sounds: Normal breath sounds. No wheezing, rhonchi or rales.  Skin:    General: Skin is warm and dry.  Neurological:     Mental Status: She is alert and oriented to person, place, and time.      Results for orders placed or performed in visit on 12/10/22  POC COVID-19  Result Value Ref Range   SARS Coronavirus 2 Ag Negative Negative  POC Influenza A/B  Result Value Ref Range   Influenza A, POC Negative Negative   Influenza B, POC    POC Rapid Strep A  Result Value Ref Range   Rapid Strep A Screen Negative Negative      Assessment & Plan:  Acute suppurative otitis media of both ears without spontaneous rupture of tympanic membranes, recurrence not specified -     Amoxicillin-Pot Clavulanate; Take 1 tablet by mouth in the morning and at bedtime for 7 days.  Dispense: 14 tablet; Refill: 0  Antibiotic-induced yeast infection -     Fluconazole; Take 1 tab now.  Repeat dose in 3 days if needed for continued symptoms.  Dispense: 1 tablet; Refill: 0  Bilateral impacted cerumen  Sore throat -     POC COVID-19 BinaxNow -     POCT Influenza A/B -     POCT rapid strep A  Viral URI with cough  Acute URI symptoms.  COVID, Flu, strep testing negative. Consent obtained.  B/l cerumen impaction removed with curette by this provider.  Pt tolerated procedure well.  Start Abx for b/l AOM.  Supportive care for URI symptoms.  Diflucan for h/o abx induced yeast infection.  No follow-ups on file.   Deeann Saint, MD

## 2022-12-17 ENCOUNTER — Other Ambulatory Visit: Payer: Self-pay | Admitting: Family Medicine

## 2022-12-17 ENCOUNTER — Telehealth: Payer: Self-pay | Admitting: Family Medicine

## 2022-12-17 DIAGNOSIS — B379 Candidiasis, unspecified: Secondary | ICD-10-CM

## 2022-12-17 MED ORDER — TERCONAZOLE 0.4 % VA CREA
1.0000 | TOPICAL_CREAM | Freq: Every day | VAGINAL | 0 refills | Status: DC
Start: 2022-12-17 — End: 2023-06-06

## 2022-12-17 MED ORDER — FLUCONAZOLE 150 MG PO TABS
ORAL_TABLET | ORAL | 0 refills | Status: DC
Start: 2022-12-17 — End: 2022-12-22

## 2022-12-17 NOTE — Telephone Encounter (Signed)
Called and left patient a VM.

## 2022-12-17 NOTE — Telephone Encounter (Signed)
Prescription Request  12/17/2022  LOV: 12/10/2022  What is the name of the medication or equipment? fluconazole (DIFLUCAN) 150 MG tablet  Pt states she is still treating a yeast infection.  Have you contacted your pharmacy to request a refill? No   Which pharmacy would you like this sent to?  [facility] [address], [place] - [address] [place] [address] Phone: [phone] Fax: [phone]    Patient notified that their request is being sent to the clinical staff for review and that they should receive a response within 2 business days.   Please advise at Mobile [phone] (mobile)

## 2022-12-17 NOTE — Telephone Encounter (Signed)
I sent 2 prescriptions,vaginal cream and diflucan x 1. Try vaginal cream for 7-10 days.  If not better she can go ahead and take the Diflucan 150 mg. Follow up with her gynecologist if symptoms are persistent. Thanks, BJ

## 2022-12-21 ENCOUNTER — Other Ambulatory Visit: Payer: Self-pay | Admitting: Family Medicine

## 2022-12-21 DIAGNOSIS — K219 Gastro-esophageal reflux disease without esophagitis: Secondary | ICD-10-CM

## 2022-12-22 ENCOUNTER — Encounter: Payer: Self-pay | Admitting: Nurse Practitioner

## 2022-12-22 ENCOUNTER — Ambulatory Visit (INDEPENDENT_AMBULATORY_CARE_PROVIDER_SITE_OTHER): Payer: Managed Care, Other (non HMO) | Admitting: Nurse Practitioner

## 2022-12-22 VITALS — BP 122/70 | HR 61 | Wt 349.0 lb

## 2022-12-22 DIAGNOSIS — N760 Acute vaginitis: Secondary | ICD-10-CM | POA: Diagnosis not present

## 2022-12-22 DIAGNOSIS — N898 Other specified noninflammatory disorders of vagina: Secondary | ICD-10-CM

## 2022-12-22 DIAGNOSIS — R300 Dysuria: Secondary | ICD-10-CM

## 2022-12-22 LAB — WET PREP FOR TRICH, YEAST, CLUE

## 2022-12-22 MED ORDER — FLUCONAZOLE 150 MG PO TABS
150.0000 mg | ORAL_TABLET | ORAL | 0 refills | Status: DC
Start: 2022-12-22 — End: 2023-05-13

## 2022-12-22 MED ORDER — METRONIDAZOLE 500 MG PO TABS
500.0000 mg | ORAL_TABLET | Freq: Two times a day (BID) | ORAL | 0 refills | Status: DC
Start: 2022-12-22 — End: 2023-02-25

## 2022-12-22 NOTE — Progress Notes (Addendum)
Acute Office Visit  Subjective:    Patient ID: Terri Cook, female    DOB: 02-18-2001, 22 y.o.   MRN: [id number]   HPI 22 y.o. presents today for vaginal itching and burning. Not sexually active for 4-5 months. Treated recently for ear infection and was provided 2 doses of Diflucan d/t history of yeast with antibiotic use.   Patient's last menstrual period was 11/18/2022 (approximate).    Review of Systems  Constitutional: Negative.   Genitourinary:  Positive for dysuria, vaginal discharge and vaginal pain (Itching/burning). Negative for difficulty urinating, flank pain, frequency, genital sores, hematuria and urgency.       Objective:    Physical Exam Constitutional:      Appearance: Normal appearance. She is obese.  Genitourinary:    Vagina: Vaginal discharge present. No erythema.     Cervix: Normal.     Comments: Generalized external redness    BP 122/70   Pulse 61   Wt (!) 349 lb (158.3 kg)   LMP 11/18/2022 (Approximate)   SpO2 100%   BMI 54.66 kg/m  Wt Readings from Last 3 Encounters:  12/22/22 (!) 349 lb (158.3 kg)  12/10/22 (!) 348 lb (157.9 kg)  11/04/22 (!) 347 lb (157.4 kg)        Patient informed chaperone available to be present for breast and/or pelvic exam. Patient has requested no chaperone to be present. Patient has been advised what will be completed during breast and pelvic exam.   Wet prep negative for pathogens  UA: trace leukocytes, negative nitrites, negative blood, dark yellow/cloudy. Microscopic: wbc 0-5, rbc 3-10, moderate bacteria  Assessment & Plan:   Problem List Items Addressed This Visit   None Visit Diagnoses     Acute vaginitis    -  Primary   Relevant Medications   metroNIDAZOLE (FLAGYL) 500 MG tablet   fluconazole (DIFLUCAN) 150 MG tablet   Vaginal itching       Relevant Orders   WET PREP FOR TRICH, YEAST, CLUE   Burning with urination       Relevant Orders   Urinalysis,Complete w/RFL Culture      Plan:  External redness present. Will treat for possible BV and yeast. Educated on proper use of medications. UA unremarkable, culture pending. Will return if symptoms worsen or do not improve.      Olivia Mackie DNP, 2:40 PM 12/22/2022

## 2022-12-24 ENCOUNTER — Ambulatory Visit (INDEPENDENT_AMBULATORY_CARE_PROVIDER_SITE_OTHER): Payer: Managed Care, Other (non HMO) | Admitting: Adult Health

## 2022-12-24 ENCOUNTER — Encounter: Payer: Self-pay | Admitting: Adult Health

## 2022-12-24 VITALS — BP 120/60 | HR 105 | Temp 98.5°F | Ht 67.0 in | Wt 345.0 lb

## 2022-12-24 DIAGNOSIS — J028 Acute pharyngitis due to other specified organisms: Secondary | ICD-10-CM | POA: Diagnosis not present

## 2022-12-24 DIAGNOSIS — B9789 Other viral agents as the cause of diseases classified elsewhere: Secondary | ICD-10-CM | POA: Diagnosis not present

## 2022-12-24 LAB — URINALYSIS, COMPLETE W/RFL CULTURE
Glucose, UA: NEGATIVE
Hgb urine dipstick: NEGATIVE
Hyaline Cast: NONE SEEN /LPF
Ketones, ur: NEGATIVE
Nitrites, Initial: NEGATIVE
Protein, ur: NEGATIVE
Specific Gravity, Urine: 1.032 (ref 1.001–1.035)
pH: 5.5 (ref 5.0–8.0)

## 2022-12-24 LAB — CULTURE INDICATED

## 2022-12-24 LAB — URINE CULTURE
MICRO NUMBER:: 15483575
SPECIMEN QUALITY:: ADEQUATE

## 2022-12-24 LAB — POCT RAPID STREP A (OFFICE): Rapid Strep A Screen: NEGATIVE

## 2022-12-24 MED ORDER — METHYLPREDNISOLONE 4 MG PO TBPK
ORAL_TABLET | ORAL | 0 refills | Status: DC
Start: 2022-12-24 — End: 2023-02-25

## 2022-12-24 MED ORDER — MAGIC MOUTHWASH W/LIDOCAINE: 5.0000 mL | Freq: Three times a day (TID) | ORAL | 0 refills | Status: DC | PRN

## 2022-12-24 NOTE — Progress Notes (Signed)
Subjective:    Patient ID: Terri Cook, female    DOB: [birth year], 22 y.o.   MRN: [id number]  Sore Throat     22 year old female who  has a past medical history of Abnormal uterine bleeding, Acid reflux, Anemia, Asthma, Fatty liver, High blood pressure, High cholesterol, Ingrown left big toenail (09/2016), PCOS (polycystic ovarian syndrome), Pre-diabetes, and Seasonal allergies.  She is a patient of Dr. Salomon Fick who I am seeing today for sore throat. She has been seen by her PCP for this on 12/10/2022. At this time she was treated with Augmentin for otitis media. Her POC strep was negative at this time. She took her abx but sore throat remained. At this time the sore throat was mild. She woke up one more a week ago and the sore throat was much worse. Pain is worse when swallowing as is described as a " severe burning pain".  She denies fevers or chills. She has not had any unprotected oral sex.    She was seen by her GYN 2 days ago and was started on Flagyl for suspected acute vaginitis    Review of Systems See HPI   Past Medical History:  Diagnosis Date   Abnormal uterine bleeding    Acid reflux    Anemia    Asthma    Fatty liver    High blood pressure    High cholesterol    Ingrown left big toenail 09/2016   recurrent   PCOS (polycystic ovarian syndrome)    Pre-diabetes    Seasonal allergies     Social History   Socioeconomic History   Marital status: Single    Spouse name: Not on file   Number of children: 0   Years of education: Not on file   Highest education level: Not on file  Occupational History   Not on file  Tobacco Use   Smoking status: Some Days    Current packs/day: 0.25    Average packs/day: 0.3 packs/day for 2.0 years (0.5 ttl pk-yrs)    Types: Cigarettes   Smokeless tobacco: Never   Tobacco comments:    1-2 a day  Vaping Use   Vaping status: Never Used  Substance and Sexual Activity   Alcohol use: No   Drug use: No   Sexual activity: Not  Currently    Birth control/protection: Abstinence, OCP  Other Topics Concern   Not on file  Social History Narrative   Not on file   Social Determinants of Health   Financial Resource Strain: Not on file  Food Insecurity: No Food Insecurity (01/02/2021)   Hunger Vital Sign    Worried About Running Out of Food in the Last Year: Never true    Ran Out of Food in the Last Year: Never true  Transportation Needs: Not on file  Physical Activity: Not on file  Stress: Not on file  Social Connections: Not on file  Intimate Partner Violence: Not on file    Past Surgical History:  Procedure Laterality Date   GALLBLADDER SURGERY  2021   LESION EXCISION Left 07/11/2014   Procedure: EXCISION OF BENIGN LESIONS INCLUDING MARGINS ;  Surgeon: Leonia Corona, MD;  Location: [facility];  Service: Pediatrics;  Laterality: Left;  mid back  and right neck   TOENAIL EXCISION  01/27/2012   Procedure: MINOR TOENAIL EXCISION;  Surgeon: Judie Petit. Leonia Corona, MD;  Location: [facility];  Service: Pediatrics;  Laterality: Left;   TOENAIL  EXCISION  03/23/2012   Procedure: MINOR TOENAIL EXCISION;  Surgeon: Judie Petit. Leonia Corona, MD;  Location: [facility];  Service: Pediatrics;  Laterality: Right;   TOENAIL EXCISION Right 09/21/2012   Procedure: Partial Excision of Right Toenail;  Surgeon: Judie Petit. Leonia Corona, MD;  Location: [facility];  Service: Pediatrics;  Laterality: Right;   TOENAIL EXCISION Left 02/15/2013   Procedure: PARTIAL TOENAIL EXCISION ON THE LEFT BIG TOE AT THE OUTER LATERAL FOLD WITH PHENOL (MP ROOM) ;  Surgeon: Judie Petit. Leonia Corona, MD;  Location: [facility];  Service: Pediatrics;  Laterality: Left;   TOENAIL EXCISION Left 08/16/2013   Procedure: PARTIAL EXCISION OF LEFT BIG TOE NAIL FROM OUTER LATERAL FOLD;  Surgeon: Judie Petit. Leonia Corona, MD;  Location: [facility];  Service: Pediatrics;  Laterality: Left;   TOENAIL  EXCISION Left 09/16/2016   Procedure: PARTIAL PERMANENT EXCISION OF LEFT INGROWN BIG TOENAIL;  Surgeon: Leonia Corona, MD;  Location:  SURGERY CENTER;  Service: General;  Laterality: Left;   WISDOM TOOTH EXTRACTION  12/08/2018   all 4    Family History  Problem Relation Age of Onset   Asthma Mother    Obesity Mother    Diabetes Father    Hypertension Father    Asthma Father    High Cholesterol Father    Obesity Father    Cervical cancer Maternal Grandmother    Cirrhosis Paternal Grandmother        caused by medication   Diabetes Paternal Grandmother     Allergies  Allergen Reactions   Clindamycin/Lincomycin Anaphylaxis    Trouble breathing?    Current Outpatient Medications on File Prior to Visit  Medication Sig Dispense Refill   Acetaminophen-Caff-Pyrilamine (MIDOL MAX ST MENSTRUAL PO) Take 2 tablets by mouth 2 (two) times daily as needed (cramps).     ADVAIR HFA 230-21 MCG/ACT inhaler Inhale 2 puffs into the lungs 2 (two) times daily. 12 g 5   albuterol (VENTOLIN HFA) 108 (90 Base) MCG/ACT inhaler Inhale 2 puffs into the lungs every 6 (six) hours as needed for wheezing or shortness of breath. 8 g 6   aspirin-acetaminophen-caffeine (EXCEDRIN EXTRA STRENGTH) 250-250-65 MG tablet Take 2 tablets by mouth every 8 (eight) hours as needed for headache or migraine.     BALZIVA 0.4-35 MG-MCG tablet Take 1 tablet by mouth daily. 84 tablet 3   cetirizine (ZYRTEC) 10 MG tablet Take 10 mg by mouth daily as needed for allergies.     cholecalciferol (VITAMIN D3) 25 MCG (1000 UNIT) tablet Take 1,000 Units by mouth daily.     colesevelam (WELCHOL) 625 MG tablet Take 3 tablets (1,875 mg total) by mouth 2 (two) times daily with a meal. Do not take within 4 hours of other medicines 120 tablet 6   dicyclomine (BENTYL) 10 MG capsule Take 1 capsule (10 mg total) by mouth 2 (two) times daily as needed for spasms. 60 capsule 1   diphenoxylate-atropine (LOMOTIL) 2.5-0.025 MG tablet Take 1  tablet by mouth 2 (two) times daily as needed for diarrhea or loose stools. 30 tablet 0   FeFum-FePoly-FA-B Cmp-C-Biot (INTEGRA PLUS) CAPS Take 1 capsule by mouth every morning. 30 capsule 2   fluconazole (DIFLUCAN) 150 MG tablet Take 1 tablet (150 mg total) by mouth every 3 (three) days. 2 tablet 0   ibuprofen (ADVIL,MOTRIN) 200 MG tablet Take 400 mg by mouth every 6 (six) hours as needed for mild pain or headache.     lactase (  LACTAID) 3000 units tablet Take by mouth 3 (three) times daily with meals.     metroNIDAZOLE (FLAGYL) 500 MG tablet Take 1 tablet (500 mg total) by mouth 2 (two) times daily. 14 tablet 0   omeprazole (PRILOSEC) 40 MG capsule Take 1 capsule (40 mg total) by mouth daily. 30 capsule 0   terconazole (TERAZOL 7) 0.4 % vaginal cream Place 1 applicator vaginally at bedtime. 45 g 0   valACYclovir (VALTREX) 500 MG tablet Take one tablet po BID x 3 days with an outbreak 30 tablet 2   Vitamin D, Ergocalciferol, (DRISDOL) 1.25 MG (50000 UNIT) CAPS capsule Take 1 capsule (50,000 Units total) by mouth every 7 (seven) days. 5 capsule 0   zinc gluconate 50 MG tablet Take 50 mg by mouth daily.     [DISCONTINUED] pantoprazole (PROTONIX) 40 MG tablet Take 40 mg by mouth daily.     No current facility-administered medications on file prior to visit.    BP 120/60   Pulse (!) 105   Temp 98.5 F (36.9 C) (Oral)   Ht 5\' 7"  (1.702 m)   Wt (!) 345 lb (156.5 kg)   LMP 11/18/2022 (Approximate)   SpO2 97%   BMI 54.03 kg/m       Objective:   Physical Exam Vitals and nursing note reviewed.  Constitutional:      Appearance: Normal appearance. She is obese.  HENT:     Mouth/Throat:     Pharynx: Oropharynx is clear. Uvula midline.     Tonsils: No tonsillar exudate or tonsillar abscesses. 1+ on the right. 1+ on the left.  Cardiovascular:     Rate and Rhythm: Normal rate and regular rhythm.     Pulses: Normal pulses.     Heart sounds: Normal heart sounds.  Pulmonary:     Effort:  Pulmonary effort is normal.     Breath sounds: Normal breath sounds.  Musculoskeletal:        General: Normal range of motion.  Lymphadenopathy:     Head:     Right side of head: No submental, submandibular, tonsillar, preauricular or posterior auricular adenopathy.     Left side of head: No submental, submandibular, tonsillar, preauricular or posterior auricular adenopathy.  Skin:    General: Skin is warm and dry.  Neurological:     General: No focal deficit present.     Mental Status: She is alert and oriented to person, place, and time.  Psychiatric:        Mood and Affect: Mood normal.        Behavior: Behavior normal.        Thought Content: Thought content normal.        Judgment: Judgment normal.       Assessment & Plan:   1. Viral sore throat - Strep negative. Will treat for viral sore throat  - Follow up if not resolving in the next 3-4 days  - magic mouthwash w/lidocaine SOLN; Take 5 mLs by mouth 3 (three) times daily as needed.  Dispense: 180 mL; Refill: 0 - methylPREDNISolone (MEDROL DOSEPAK) 4 MG TBPK tablet; Take as directed  Dispense: 21 tablet; Refill: 0 - POC Rapid Strep A- Negative   Shirline Frees, NP

## 2023-01-26 ENCOUNTER — Encounter: Payer: Self-pay | Admitting: Physician Assistant

## 2023-02-01 ENCOUNTER — Telehealth: Payer: Self-pay | Admitting: Pharmacy Technician

## 2023-02-01 ENCOUNTER — Other Ambulatory Visit (HOSPITAL_COMMUNITY): Payer: Self-pay

## 2023-02-01 NOTE — Telephone Encounter (Signed)
Pharmacy Patient Advocate Encounter   Received notification from CoverMyMeds that prior authorization for COLESEVELAM 625MG  is required/requested.   Insurance verification completed.   The patient is insured through [facility] MEDICAID .   Per test claim: PA required; PA submitted to [facility] [place] Medicaid via CoverMyMeds Key/confirmation #/[id number] Status is pending

## 2023-02-09 ENCOUNTER — Telehealth: Payer: Self-pay | Admitting: Nurse Practitioner

## 2023-02-09 NOTE — Telephone Encounter (Signed)
Inbound call from patient stating Colesevelam is not covered by her insurance. Patient is requesting to know if there is an alternative medication that could be called in her pharmacy. Patient requesting a call back after 11 am tomorrow. Please advise, thank you

## 2023-02-10 ENCOUNTER — Telehealth: Payer: Self-pay

## 2023-02-10 ENCOUNTER — Encounter: Payer: Self-pay | Admitting: Physician Assistant

## 2023-02-10 NOTE — Telephone Encounter (Signed)
Patient called requesting an alternative medication Welchol is not covered under insurance sent message to provider for alternative medication. Left message on patient voice mail. PA was started and still pending.

## 2023-02-10 NOTE — Telephone Encounter (Signed)
You were the CMA working with Gunnar Fusi on 8/1. Per Lavonna Rua message should be routed to you.   Thank you.

## 2023-02-10 NOTE — Telephone Encounter (Signed)
Inbound call from patient, following up on call below,

## 2023-02-10 NOTE — Telephone Encounter (Signed)
Returned patient call regarding

## 2023-02-11 ENCOUNTER — Other Ambulatory Visit: Payer: Self-pay | Admitting: *Deleted

## 2023-02-11 DIAGNOSIS — K219 Gastro-esophageal reflux disease without esophagitis: Secondary | ICD-10-CM

## 2023-02-11 MED ORDER — COLESTIPOL HCL 1 G PO TABS
1.0000 g | ORAL_TABLET | Freq: Two times a day (BID) | ORAL | 0 refills | Status: DC
Start: 2023-02-11 — End: 2023-02-25

## 2023-02-11 NOTE — Telephone Encounter (Signed)
Called patient to see if she has taken the Cholestyramine or the Colestid medications in the past, although not noted in the mediation list. Patient states she has not, to her knowledge, taken either medication, but she is allergic the Clindamycin medication. Informed the patient the Clindamycin is a antibiotic and is different from the meds mentioned- these medications are cholesterol medications that are administered for their anti-diarrheal effects. Patient would like to try either medication for her diarrhea Dr. Adela Lank. Please advise.

## 2023-02-11 NOTE — Telephone Encounter (Signed)
Have we tried all formulations of this?  We could try cholestyramine or Colestid if we have not tried that yet.  Thank

## 2023-02-11 NOTE — Telephone Encounter (Signed)
Okay let's try Colestid 1gm BID and see if that is covered by her insurance. Thanks

## 2023-02-11 NOTE — Telephone Encounter (Signed)
Ms Terri Pali, NP and Dr. Myrtie Neither are out of office today. Patient states the Welchol medication is not covered and wants to know if there is an alternative medication that can be ordered for diarrhea?

## 2023-02-11 NOTE — Telephone Encounter (Signed)
Called the patient to inform of the new medication for diarrhea per Dr. Adela Lank; Colestid 1 gm BID. Patient understood and agreed.

## 2023-02-15 NOTE — Telephone Encounter (Signed)
Pharmacy Patient Advocate Encounter  Received notification from [facility] that Prior Authorization for Colesevelam 625mg  has been DENIED.  Full denial letter will be uploaded to the media tab. See denial reason below.   PA #/Case ID/Reference #:

## 2023-02-16 NOTE — Telephone Encounter (Signed)
Patient called requested to speak with a nurse, stated the Colestid medication is not helping her. Please advise.

## 2023-02-17 NOTE — Telephone Encounter (Signed)
Called patient to inform about complaints of abdominal pain. Patient states she obtained the medication on 02/12/23 and has been taking the medication as prescribed for 5 days and there has been some improvement although not to the point where there is no pain.

## 2023-02-17 NOTE — Telephone Encounter (Signed)
As she has ongoing symptoms of both diarrhea and pain, I think we need to see her in clinic to discuss the probability of endoscopic procedures.  Please find an upcoming 7-day hold and when it can be released, give her an appointment with me or Gunnar Fusi for further discussion.  H Danis

## 2023-02-17 NOTE — Telephone Encounter (Signed)
This patient belongs to Dr. Myrtie Neither / Gunnar Fusi - I helped when they were out of the office, will forward to them for additional comments.  Colestid was given to her for diarrhea, not abdominal pain.

## 2023-02-18 NOTE — Telephone Encounter (Signed)
Called patient to inform of the need to be seen in the clinic per Dr. Myrtie Neither. No answer via her phone. Left message with scheduled OV with Willette Cluster, NP on 02/25/23 at 1:30 pm. Also informed the patient if unable to keep this appt to please call and notify us.

## 2023-02-25 ENCOUNTER — Ambulatory Visit (INDEPENDENT_AMBULATORY_CARE_PROVIDER_SITE_OTHER): Payer: Managed Care, Other (non HMO) | Admitting: Nurse Practitioner

## 2023-02-25 ENCOUNTER — Encounter: Payer: Self-pay | Admitting: Nurse Practitioner

## 2023-02-25 VITALS — BP 120/80 | HR 110 | Ht 67.0 in | Wt 350.1 lb

## 2023-02-25 DIAGNOSIS — K529 Noninfective gastroenteritis and colitis, unspecified: Secondary | ICD-10-CM

## 2023-02-25 MED ORDER — COLESTIPOL HCL 1 G PO TABS: 2.0000 g | ORAL_TABLET | Freq: Two times a day (BID) | ORAL | 5 refills | Status: DC

## 2023-02-25 NOTE — Progress Notes (Unsigned)
ASSESSMENT    Brief Narrative:  22 y.o.  female known to Dr. Myrtie Neither with a past medical history not limited to lactose intolerance, chronic diarrhea, morbid obesity, PCOS, asthma, menorrhagia , iron deficiency anemia postcholecystectomy.   History of IDA, ? 2/2 to heavy menses Followed by Hematology    See PMH below for additional history  PLAN         HPI   Chief complaint :  Brief GI history:  Terri Cook has been previously evaluated here for diarrhea.  Infectious workup negative up . She discovered that she was lactose intolerant.  Also felt to have a component of bile acid diarrhea postcholecystectomy.  She was seen here mid June with worsening diarrhea and abdominal pain after a bout of what sounds like gastroenteritis in April.  She was given a trial of dicyclomine for the abdominal pain.  WelChol was increased to 3 twice daily and Lomotil was added to take as needed.  Subsequently through phone messages she was given a trial of Xifaxan for possible SIBO.  Though that did help with the diarrhea there was not much improvement in that abdominal pain and then she began seeing some blood in her stool.  Treated  empirically for hemorrhoids and given follow up appt.    I saw back IN August for follow up and she was doing better  so we held off on further evaluation. Welchol 3 TID was continued.     Interval history:  Terri Cook called on 11/14 with abdominal  pain and diarrhea.      Procedure risk assessment:  No history of CHF.  No supplemental 02 use at home.  Not a known difficult airway Anticoagulant:     GI History / Pertinent GI Studies    **All endoscopic studies may not be included here          Latest Ref Rng & Units 10/22/2021   12:48 PM 09/30/2021   11:50 AM 03/16/2021    2:51 PM  Hepatic Function  Total Protein 6.5 - 8.1 g/dL 7.2  7.2  6.4   Albumin 3.5 - 5.0 g/dL 4.2  4.6  3.4   AST 15 - 41 U/L 19  29  28    ALT 0 - 44 U/L 36  52  49   Alk Phosphatase  38 - 126 U/L 76  89  64   Total Bilirubin 0.3 - 1.2 mg/dL 0.7  1.0  0.6        Latest Ref Rng & Units 10/19/2022    9:39 AM 07/22/2022    1:55 PM 05/31/2022    3:27 PM  CBC  WBC 4.0 - 10.5 K/uL 10.3  9.0  9.3   Hemoglobin 12.0 - 15.0 g/dL 46.9  62.9  52.8   Hematocrit 36.0 - 46.0 % 43.3  44.4  44.5   Platelets 150 - 400 K/uL 355  408  414.0      Past Medical History:  Diagnosis Date   Abnormal uterine bleeding    Acid reflux    Anemia    Asthma    Fatty liver    High blood pressure    High cholesterol    Ingrown left big toenail 09/2016   recurrent   PCOS (polycystic ovarian syndrome)    Pre-diabetes    Seasonal allergies     Past Surgical History:  Procedure Laterality Date   GALLBLADDER SURGERY  2021   LESION EXCISION Left 07/11/2014   Procedure: EXCISION OF  BENIGN LESIONS INCLUDING MARGINS ;  Surgeon: Leonia Corona, MD;  Location: [facility];  Service: Pediatrics;  Laterality: Left;  mid back  and right neck   TOENAIL EXCISION  01/27/2012   Procedure: MINOR TOENAIL EXCISION;  Surgeon: Judie Petit. Leonia Corona, MD;  Location: [facility];  Service: Pediatrics;  Laterality: Left;   TOENAIL EXCISION  03/23/2012   Procedure: MINOR TOENAIL EXCISION;  Surgeon: Judie Petit. Leonia Corona, MD;  Location: [facility];  Service: Pediatrics;  Laterality: Right;   TOENAIL EXCISION Right 09/21/2012   Procedure: Partial Excision of Right Toenail;  Surgeon: Judie Petit. Leonia Corona, MD;  Location: [facility];  Service: Pediatrics;  Laterality: Right;   TOENAIL EXCISION Left 02/15/2013   Procedure: PARTIAL TOENAIL EXCISION ON THE LEFT BIG TOE AT THE OUTER LATERAL FOLD WITH PHENOL (MP ROOM) ;  Surgeon: Judie Petit. Leonia Corona, MD;  Location: [facility];  Service: Pediatrics;  Laterality: Left;   TOENAIL EXCISION Left 08/16/2013   Procedure: PARTIAL EXCISION OF LEFT BIG TOE NAIL FROM OUTER LATERAL FOLD;  Surgeon: Judie Petit. Leonia Corona, MD;   Location: [facility];  Service: Pediatrics;  Laterality: Left;   TOENAIL EXCISION Left 09/16/2016   Procedure: PARTIAL PERMANENT EXCISION OF LEFT INGROWN BIG TOENAIL;  Surgeon: Leonia Corona, MD;  Location:  SUR[facility]ERY [facility];  Service: General;  Laterality: Left;   WISDOM TOOTH EXTRACTION  12/08/2018   all 4    Family History  Problem Relation Age of Onset   Asthma Mother    Obesity Mother    Diabetes Father    Hypertension Father    Asthma Father    High Cholesterol Father    Obesity Father    Cervical cancer Maternal Grandmother    Cirrhosis Paternal Grandmother        caused by medication   Diabetes Paternal Grandmother     Current Medications, Allergies, Family History and Social History were reviewed in [employer] electronic medical record.     Current Outpatient Medications  Medication Sig Dispense Refill   Acetaminophen-Caff-Pyrilamine (MIDOL MAX ST MENSTRUAL PO) Take 2 tablets by mouth 2 (two) times daily as needed (cramps).     ADVAIR HFA 230-21 MCG/ACT inhaler Inhale 2 puffs into the lungs 2 (two) times daily. 12 g 5   albuterol (VENTOLIN HFA) 108 (90 Base) MCG/ACT inhaler Inhale 2 puffs into the lungs every 6 (six) hours as needed for wheezing or shortness of breath. 8 g 6   aspirin-acetaminophen-caffeine (EXCEDRIN EXTRA STRENGTH) 250-250-65 MG tablet Take 2 tablets by mouth every 8 (eight) hours as needed for headache or migraine.     BALZIVA 0.4-35 MG-MCG tablet Take 1 tablet by mouth daily. 84 tablet 3   cetirizine (ZYRTEC) 10 MG tablet Take 10 mg by mouth daily as needed for allergies.     cholecalciferol (VITAMIN D3) 25 MCG (1000 UNIT) tablet Take 1,000 Units by mouth daily.     colesevelam (WELCHOL) 625 MG tablet Take 3 tablets (1,875 mg total) by mouth 2 (two) times daily with a meal. Do not take within 4 hours of other medicines 120 tablet 6   colestipol (COLESTID) 1 g tablet Take 1 tablet (1 g total) by mouth 2 (two) times daily. 60  tablet 0   dicyclomine (BENTYL) 10 MG capsule Take 1 capsule (10 mg total) by mouth 2 (two) times daily as needed for spasms. 60 capsule 1   diphenoxylate-atropine (LOMOTIL) 2.5-0.025 MG tablet Take 1 tablet  by mouth 2 (two) times daily as needed for diarrhea or loose stools. 30 tablet 0   FeFum-FePoly-FA-B Cmp-C-Biot (INTEGRA PLUS) CAPS Take 1 capsule by mouth every morning. 30 capsule 2   fluconazole (DIFLUCAN) 150 MG tablet Take 1 tablet (150 mg total) by mouth every 3 (three) days. 2 tablet 0   ibuprofen (ADVIL,MOTRIN) 200 MG tablet Take 400 mg by mouth every 6 (six) hours as needed for mild pain or headache.     lactase (LACTAID) 3000 units tablet Take by mouth 3 (three) times daily with meals.     magic mouthwash w/lidocaine SOLN Take 5 mLs by mouth 3 (three) times daily as needed. 180 mL 0   methylPREDNISolone (MEDROL DOSEPAK) 4 MG TBPK tablet Take as directed 21 tablet 0   metroNIDAZOLE (FLAGYL) 500 MG tablet Take 1 tablet (500 mg total) by mouth 2 (two) times daily. 14 tablet 0   omeprazole (PRILOSEC) 40 MG capsule Take 1 capsule (40 mg total) by mouth daily. 30 capsule 0   terconazole (TERAZOL 7) 0.4 % vaginal cream Place 1 applicator vaginally at bedtime. 45 g 0   valACYclovir (VALTREX) 500 MG tablet Take one tablet po BID x 3 days with an outbreak 30 tablet 2   zinc gluconate 50 MG tablet Take 50 mg by mouth daily.     No current facility-administered medications for this visit.    Review of Systems: No chest pain. No shortness of breath. No urinary complaints.    Physical Exam  Filed Weights   02/25/23 1332  Weight: (!) 350 lb 2 oz (158.8 kg)   Wt Readings from Last 3 Encounters:  02/25/23 (!) 350 lb 2 oz (158.8 kg)  12/24/22 (!) 345 lb (156.5 kg)  12/22/22 (!) 349 lb (158.3 kg)    Ht 5\' 7"  (1.702 m)   Wt (!) 350 lb 2 oz (158.8 kg)   BMI 54.84 kg/m  Constitutional:  Pleasant, generally well appearing ***female in no acute distress. Psychiatric: Normal mood and  affect. Behavior is normal. EENT: Pupils normal.  Conjunctivae are normal. No scleral icterus. Neck supple.  Cardiovascular: Normal rate, regular rhythm.  Pulmonary/chest: Effort normal and breath sounds normal. No wheezing, rales or rhonchi. Abdominal: Soft, nondistended, nontender. Bowel sounds active throughout. There are no masses palpable. No hepatomegaly. Neurological: Alert and oriented to person place and time.  Extremities: *** edema  Terri Cluster, NP  02/25/2023, 1:43 PM  Cc:  Deeann Saint, MD

## 2023-02-25 NOTE — Patient Instructions (Addendum)
Contact Paula,NP in 1 month with an update and to discuss colonoscopy.  We have sent the following medications to your pharmacy for you to pick up at your convenience: Colestid  Due to recent changes in healthcare laws, you may see the results of your imaging and laboratory studies on MyChart before your provider has had a chance to review them.  We understand that in some cases there may be results that are confusing or concerning to you. Not all laboratory results come back in the same time frame and the provider may be waiting for multiple results in order to interpret others.  Please give [place] 48 hours in order for your provider to thoroughly review all the results before contacting the office for clarification of your results.   It was a pleasure to see you today!  Thank you for trusting me with your gastrointestinal care!

## 2023-02-26 ENCOUNTER — Encounter: Payer: Self-pay | Admitting: Nurse Practitioner

## 2023-02-28 ENCOUNTER — Encounter: Payer: Self-pay | Admitting: Gastroenterology

## 2023-03-01 NOTE — Progress Notes (Signed)
____________________________________________________________  Attending physician addendum:  Thank you for sending this case to me. I have reviewed the entire note and agree with the plan.   Malyssa Maris Danis, MD  ____________________________________________________________  

## 2023-03-28 IMAGING — CT CT ABD-PELV W/ CM
2 of 4 series · 16 of 46 positions shown, 18 images · IV contrast (omnipaque)
Comparison: None.

CLINICAL DATA: Right lower quadrant abdominal pain

EXAM:
CT ABDOMEN AND PELVIS WITH CONTRAST
TECHNIQUE: Multidetector CT imaging of the abdomen and pelvis was performed
using the standard protocol following bolus administration of
intravenous contrast.
CONTRAST:  100mL OMNIPAQUE IOHEXOL 300 MG/ML  SOLN

[Series 4: abdomen 5.0 · axial · 0.98mm/px · z∈[-992,-532]mm · 13 of 106 slices shown, 15 images]
[im 7/106  soft-tissue]
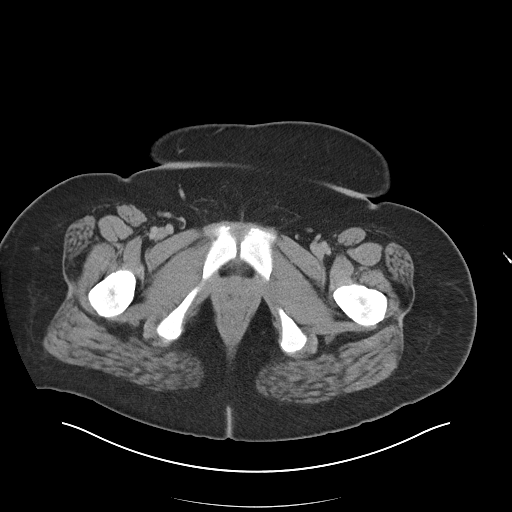
[im 7/106  bone]
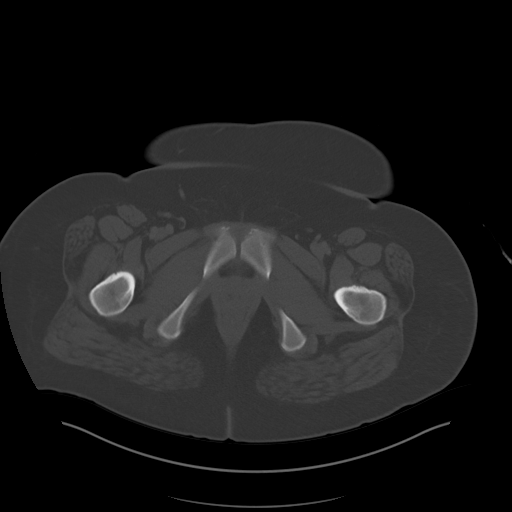
[im 13/106  soft-tissue]
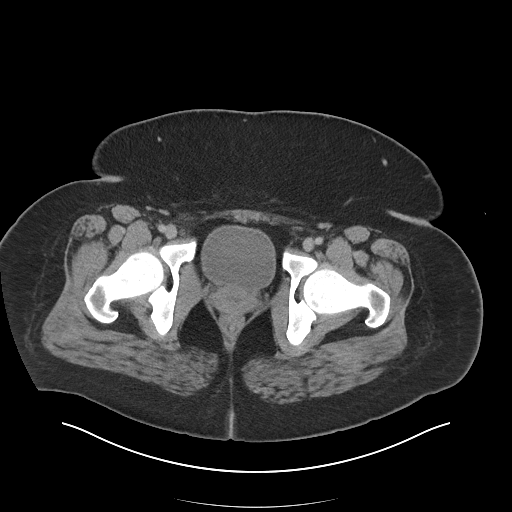
[im 25/106  soft-tissue]
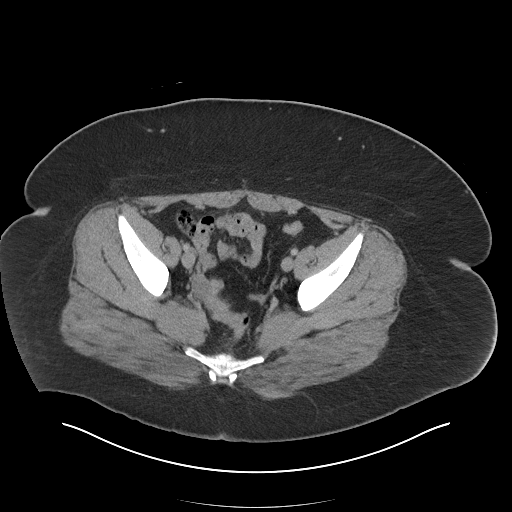
[im 31/106  soft-tissue]
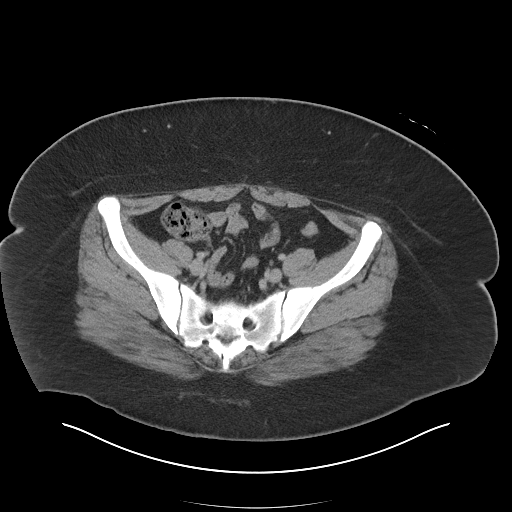
[im 38/106  soft-tissue]
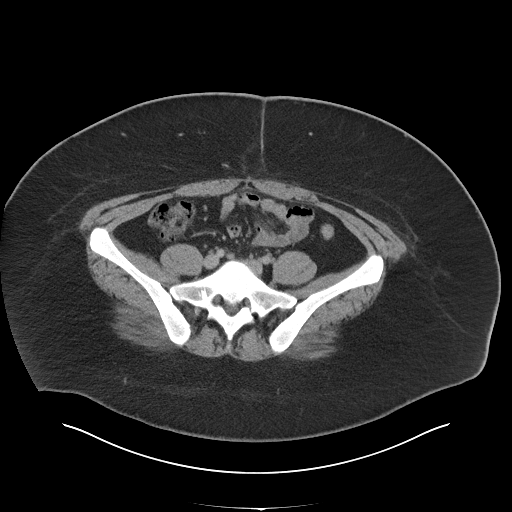
[im 44/106  soft-tissue]
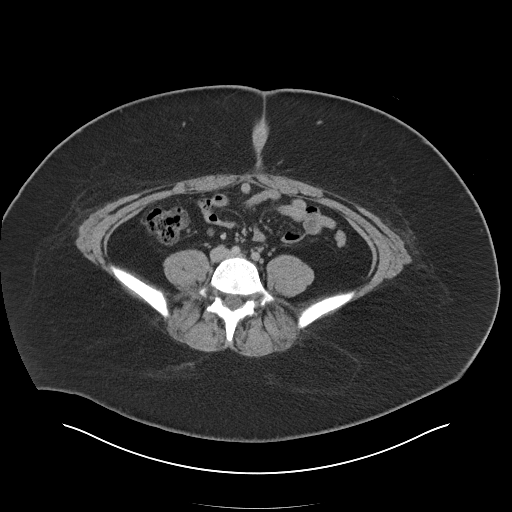
[im 56/106  soft-tissue]
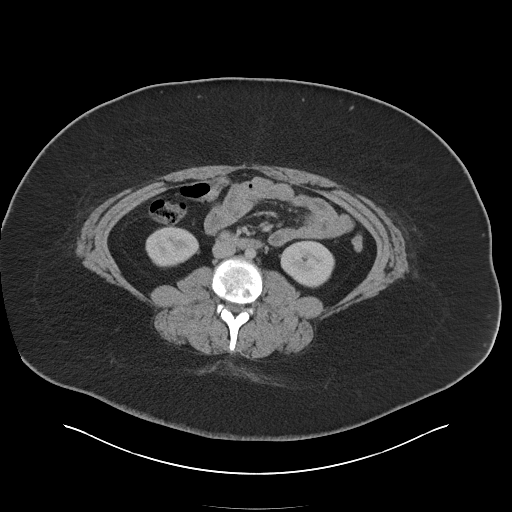
[im 62/106  soft-tissue]
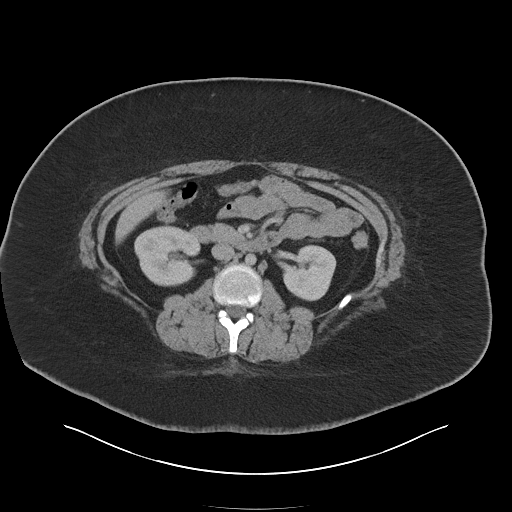
[im 68/106  soft-tissue]
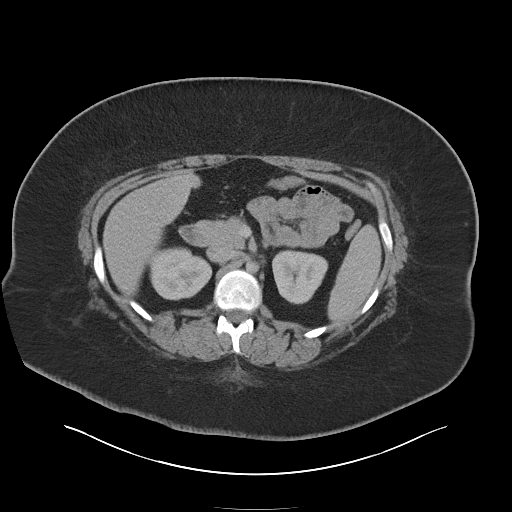
[im 68/106  bone]
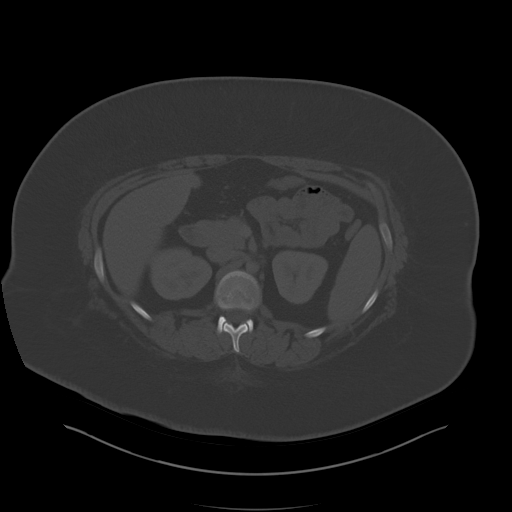
[im 75/106  soft-tissue]
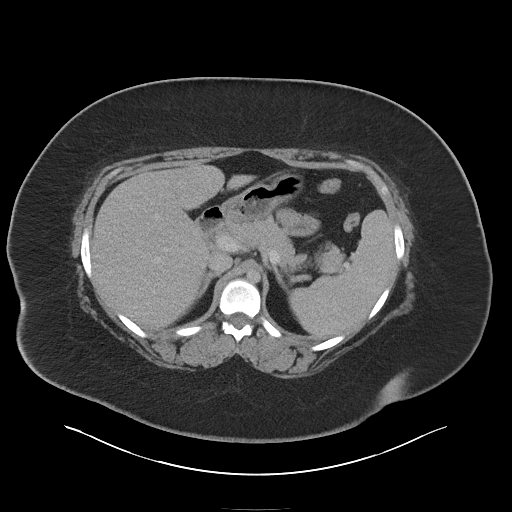
[im 81/106  soft-tissue]
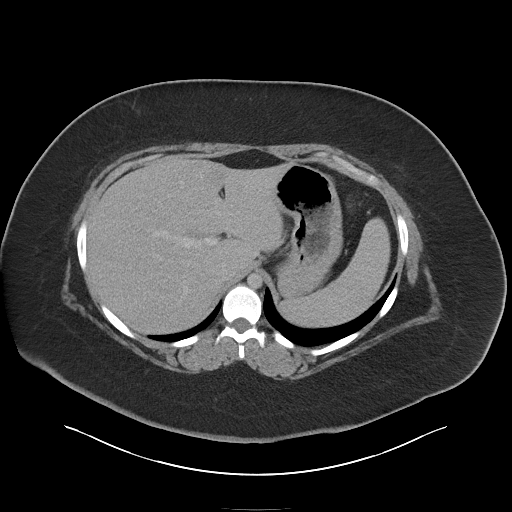
[im 93/106  soft-tissue]
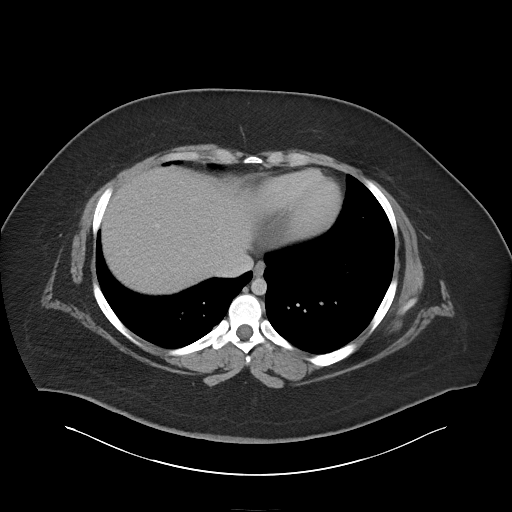
[im 99/106  soft-tissue]
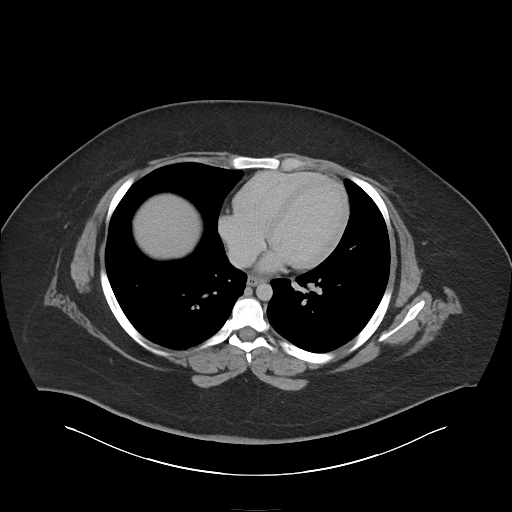

[Series 6: abdomen 3.0 mpr cor · coronal · 0.98mm/px · 3 of 117 slices shown]
[im 39/117  soft-tissue]
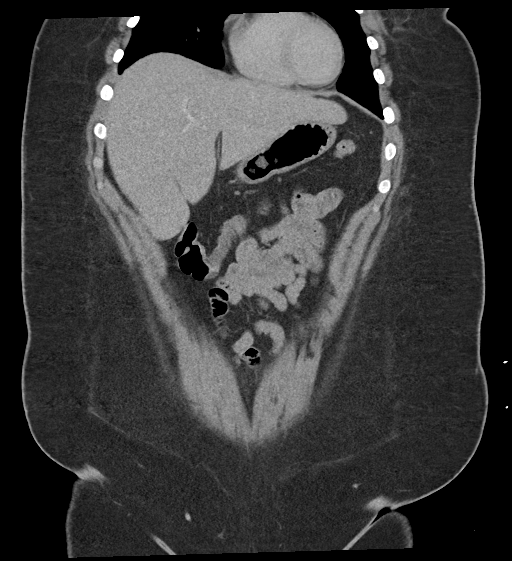
[im 52/117  soft-tissue]
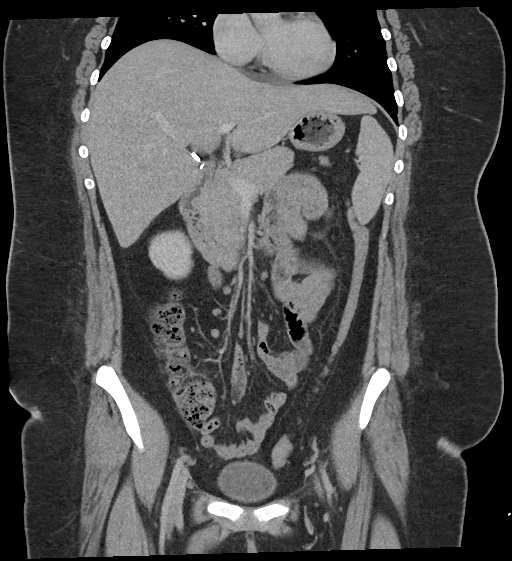
[im 65/117  soft-tissue]
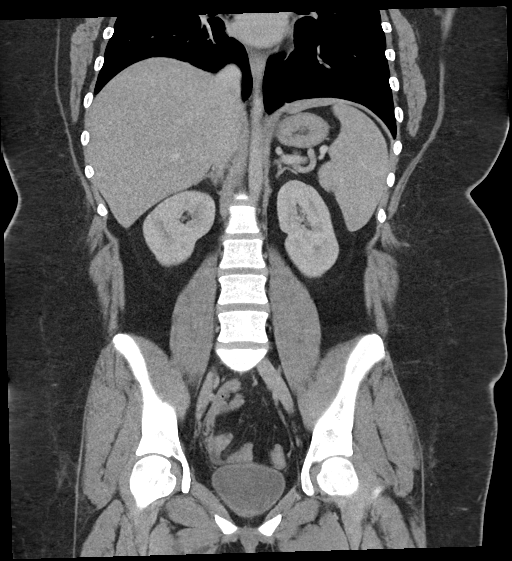

[16 of 46 positions shown; findings below may reference images not displayed]

FINDINGS: Lower chest: No acute abnormality.

Hepatobiliary: No focal liver abnormality is seen. Status post
cholecystectomy. No biliary dilatation.

Pancreas: Unremarkable. No pancreatic ductal dilatation or
surrounding inflammatory changes.

Spleen: Normal in size without significant abnormality.

Adrenals/Urinary Tract: Adrenal glands are unremarkable. Kidneys are
normal, without renal calculi, solid lesion, or hydronephrosis.
Bladder is unremarkable.

Stomach/Bowel: Stomach is within normal limits. There is an
appendicolith in the distal appendix, which is normal in caliber
without evidence of inflammation (series 4, image 78, series 7,
image 73). No evidence of bowel wall thickening, distention, or
inflammatory changes.

Vascular/Lymphatic: No significant vascular findings are present. No
enlarged abdominal or pelvic lymph nodes.

Reproductive: No mass or other significant abnormality. Small
ovarian follicles.

Other: No abdominal wall hernia or abnormality. Trace free fluid in
the low pelvis.

Musculoskeletal: No acute or significant osseous findings.
IMPRESSION: 1. No CT findings of the abdomen or pelvis to explain right lower
quadrant pain.
2. There is an appendicolith in the distal appendix, which is
however normal in caliber without evidence of inflammation.
3. Trace free fluid in the low pelvis, nonspecific and likely
functional in the reproductive age setting.
4. Status post cholecystectomy.

## 2023-04-08 ENCOUNTER — Telehealth: Payer: Self-pay

## 2023-04-08 ENCOUNTER — Encounter: Payer: Self-pay | Admitting: Physician Assistant

## 2023-04-08 NOTE — Telephone Encounter (Signed)
 Dr. Legrand,   I see this patient had an OV on 02/25/23. She is scheduled to have a colonoscopy with you here at [facility] but unfortunately her BMI is > 50. I have attached [place]. I am not sure if we are able to ger her apt before her PV on 04/18/23.   Thank you, PV

## 2023-04-11 ENCOUNTER — Other Ambulatory Visit: Payer: Self-pay

## 2023-04-11 ENCOUNTER — Telehealth: Payer: Self-pay | Admitting: *Deleted

## 2023-04-11 DIAGNOSIS — K529 Noninfective gastroenteritis and colitis, unspecified: Secondary | ICD-10-CM

## 2023-04-11 NOTE — Telephone Encounter (Signed)
 She can go in my hospital procedure block on 05/10/23. Please contact her and make the arrangements.  - HD

## 2023-04-11 NOTE — Telephone Encounter (Signed)
 Yesi,  This pt's BMI is greater than 50; their procedure will need to be performed at the hospital.  Thanks,  Cathlyn Parsons

## 2023-04-11 NOTE — Telephone Encounter (Deleted)
Dra

## 2023-04-11 NOTE — Telephone Encounter (Signed)
 Called and spoke with patient regarding changing location of her colonoscopy. Patient is OK with changing to [facility] on 05/10/23 at 10:15 am. Pt knows that she will need to arrive at [facility] by 8:45 am with a care partner. Pt knows to expect a call from PV next week to review prep further. Pt also mentioned RUQ pain at night, s/p cholecystectomy and hx of fatty liver. I told pt that it is possible to still get stones stuck in the duct. Pt has been advised to follow a low fat diet, pt will follow up if she has continued pain. Pt verbalized understanding and had no concerns at the end of the call.   [facility] colonoscopy cancelled.

## 2023-04-18 ENCOUNTER — Ambulatory Visit (AMBULATORY_SURGERY_CENTER): Payer: Managed Care, Other (non HMO)

## 2023-04-18 VITALS — Ht 67.0 in | Wt 350.0 lb

## 2023-04-18 DIAGNOSIS — K529 Noninfective gastroenteritis and colitis, unspecified: Secondary | ICD-10-CM

## 2023-04-18 MED ORDER — NA SULFATE-K SULFATE-MG SULF 17.5-3.13-1.6 GM/177ML PO SOLN: 1.0000 | Freq: Once | ORAL | 0 refills | Status: AC

## 2023-04-18 NOTE — Progress Notes (Signed)
 No egg or soy allergy known to patient  No issues known to pt with past sedation with any surgeries or procedures Patient denies ever being told they had issues or difficulty with intubation  No FH of Malignant Hyperthermia Pt is not on diet pills Pt is not on  home 02  Pt is not on blood thinners  Pt reports constipation with diarrhea. RUQ pain reported.  No A fib or A flutter Have any cardiac testing pending-- no  LOA: independent  Prep: suprep     PV competed with patient. Prep instructions sent via mychart and home address.

## 2023-05-03 ENCOUNTER — Telehealth: Payer: Self-pay | Admitting: Gastroenterology

## 2023-05-03 ENCOUNTER — Encounter (HOSPITAL_COMMUNITY): Payer: Self-pay | Admitting: Gastroenterology

## 2023-05-03 NOTE — Telephone Encounter (Signed)
Inbound call from patient stating that she is scheduled to have a colonoscopy on 2/4 with Dr. Myrtie Neither at the hospital and has questions regarding the procedure and is requesting a call from the nurse to discuss. Please advise.

## 2023-05-03 NOTE — Telephone Encounter (Signed)
Returned call.  Wanted to verify she can have clear liquids (blue or white) with her prep.  Informed patient as long as they were clear and she could see through liquid she is able to have them.  Reiterated not to have anything colored red or purple, she verbalized understanding.  Denies further questions.

## 2023-05-05 ENCOUNTER — Encounter: Payer: Self-pay | Admitting: Gastroenterology

## 2023-05-10 ENCOUNTER — Ambulatory Visit (HOSPITAL_COMMUNITY)
Admission: RE | Admit: 2023-05-10 | Discharge: 2023-05-10 | Disposition: A | Discharge status: Home / Self Care | Payer: Managed Care, Other (non HMO) | Attending: Gastroenterology | Admitting: Gastroenterology

## 2023-05-10 ENCOUNTER — Encounter (HOSPITAL_COMMUNITY): Payer: Self-pay | Admitting: Gastroenterology

## 2023-05-10 ENCOUNTER — Other Ambulatory Visit: Payer: Self-pay

## 2023-05-10 ENCOUNTER — Ambulatory Visit (HOSPITAL_COMMUNITY): Payer: Managed Care, Other (non HMO)

## 2023-05-10 ENCOUNTER — Encounter (HOSPITAL_COMMUNITY)
Admission: RE | Disposition: A | Discharge status: Home / Self Care | Payer: Self-pay | Source: Home / Self Care | Attending: Gastroenterology

## 2023-05-10 DIAGNOSIS — K625 Hemorrhage of anus and rectum: Secondary | ICD-10-CM | POA: Diagnosis not present

## 2023-05-10 DIAGNOSIS — K529 Noninfective gastroenteritis and colitis, unspecified: Secondary | ICD-10-CM | POA: Diagnosis present

## 2023-05-10 DIAGNOSIS — J45909 Unspecified asthma, uncomplicated: Secondary | ICD-10-CM | POA: Diagnosis not present

## 2023-05-10 DIAGNOSIS — F17200 Nicotine dependence, unspecified, uncomplicated: Secondary | ICD-10-CM | POA: Diagnosis not present

## 2023-05-10 DIAGNOSIS — K219 Gastro-esophageal reflux disease without esophagitis: Secondary | ICD-10-CM | POA: Insufficient documentation

## 2023-05-10 DIAGNOSIS — K648 Other hemorrhoids: Secondary | ICD-10-CM | POA: Diagnosis not present

## 2023-05-10 DIAGNOSIS — K760 Fatty (change of) liver, not elsewhere classified: Secondary | ICD-10-CM | POA: Diagnosis not present

## 2023-05-10 HISTORY — PX: BIOPSY: SHX5522

## 2023-05-10 HISTORY — PX: COLONOSCOPY WITH PROPOFOL: SHX5780

## 2023-05-10 LAB — PREGNANCY, URINE: Preg Test, Ur: NEGATIVE

## 2023-05-10 SURGERY — COLONOSCOPY WITH PROPOFOL
Anesthesia: Monitor Anesthesia Care

## 2023-05-10 MED ORDER — SODIUM CHLORIDE 0.9 % IV SOLN: INTRAVENOUS | Status: DC

## 2023-05-10 MED ORDER — PROPOFOL 10 MG/ML IV BOLUS
INTRAVENOUS | Status: DC | PRN
  Administered 2023-05-10: 50 mg via INTRAVENOUS

## 2023-05-10 MED ORDER — PROPOFOL 500 MG/50ML IV EMUL
INTRAVENOUS | Status: DC | PRN
  Administered 2023-05-10: 175 ug/kg/min via INTRAVENOUS

## 2023-05-10 SURGICAL SUPPLY — 20 items
ELECT REM PT RETURN 9FT ADLT (ELECTROSURGICAL)
ELECTRODE REM PT RTRN 9FT ADLT (ELECTROSURGICAL) IMPLANT
FLOOR PAD 36X40 (MISCELLANEOUS) ×2
FORCEPS BIOP RAD 4 LRG CAP 4 (CUTTING FORCEPS) IMPLANT
FORCEPS BIOP RJ4 240 W/NDL (CUTTING FORCEPS)
FORCEPS BXJMBJMB 240X2.8X (CUTTING FORCEPS) IMPLANT
INJECTOR/SNARE I SNARE (MISCELLANEOUS) IMPLANT
LUBRICANT JELLY 4.5OZ STERILE (MISCELLANEOUS) IMPLANT
MANIFOLD NEPTUNE II (INSTRUMENTS) IMPLANT
NDL SCLEROTHERAPY 25GX240 (NEEDLE) IMPLANT
NEEDLE SCLEROTHERAPY 25GX240 (NEEDLE)
PAD FLOOR 36X40 (MISCELLANEOUS) ×3 IMPLANT
PROBE APC STR FIRE (PROBE) IMPLANT
PROBE INJECTION GOLD 7FR (MISCELLANEOUS) IMPLANT
SNARE ROTATE MED OVAL 20MM (MISCELLANEOUS) IMPLANT
SYR 50ML LL SCALE MARK (SYRINGE) IMPLANT
TRAP SPECIMEN MUCOUS 40CC (MISCELLANEOUS) IMPLANT
TUBING ENDO SMARTCAP PENTAX (MISCELLANEOUS) IMPLANT
TUBING IRRIGATION ENDOGATOR (MISCELLANEOUS) ×3 IMPLANT
WATER STERILE IRR 1000ML POUR (IV SOLUTION) IMPLANT

## 2023-05-10 NOTE — H&P (Signed)
 History and Physical:  This patient presents for endoscopic testing for: Chronic diarrhea Rectal bleeding  Clinical details in 02/25/2023 office consult note.  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  Was improved on WelChol , then changed to colestipol  for insurance reasons.  Has had some bloating and generalized abdominal pain.  Iron  deficiency anemia with negative celiac serology, felt likely due to menorrhagia. She is here today for colonoscopic evaluation of the symptoms.  Patient is otherwise without complaints or active issues today.   Past Medical History: Past Medical History:  Diagnosis Date   Abnormal uterine bleeding    Acid reflux    Anemia    Asthma    Fatty liver    High blood pressure    High cholesterol    Ingrown left big toenail 09/2016   recurrent   PCOS (polycystic ovarian syndrome)    Pre-diabetes    Seasonal allergies      Past Surgical History: Past Surgical History:  Procedure Laterality Date   GALLBLADDER SURGERY  2021   LESION EXCISION Left 07/11/2014   Procedure: EXCISION OF BENIGN LESIONS INCLUDING MARGINS ;  Surgeon: Julietta Millman, MD;  Location: [facility];  Service: Pediatrics;  Laterality: Left;  mid back  and right neck   TOENAIL EXCISION  01/27/2012   Procedure: MINOR TOENAIL EXCISION;  Surgeon: CHRISTELLA. Julietta Millman, MD;  Location: [facility];  Service: Pediatrics;  Laterality: Left;   TOENAIL EXCISION  03/23/2012   Procedure: MINOR TOENAIL EXCISION;  Surgeon: CHRISTELLA. Julietta Millman, MD;  Location: [facility];  Service: Pediatrics;  Laterality: Right;   TOENAIL EXCISION Right 09/21/2012   Procedure: Partial Excision of Right Toenail;  Surgeon: CHRISTELLA. Julietta Millman, MD;  Location: [facility];  Service: Pediatrics;  Laterality: Right;   TOENAIL EXCISION Left 02/15/2013   Procedure: PARTIAL TOENAIL EXCISION ON THE LEFT BIG TOE AT THE OUTER  LATERAL FOLD WITH PHENOL (MP ROOM) ;  Surgeon: CHRISTELLA. Julietta Millman, MD;  Location: [facility];  Service: Pediatrics;  Laterality: Left;   TOENAIL EXCISION Left 08/16/2013   Procedure: PARTIAL EXCISION OF LEFT BIG TOE NAIL FROM OUTER LATERAL FOLD;  Surgeon: CHRISTELLA. Julietta Millman, MD;  Location: [facility];  Service: Pediatrics;  Laterality: Left;   TOENAIL EXCISION Left 09/16/2016   Procedure: PARTIAL PERMANENT EXCISION OF LEFT INGROWN BIG TOENAIL;  Surgeon: Millman Julietta, MD;  Location:  SUR[facility]ERY C[facility];  Service: General;  Laterality: Left;   WISDOM TOOTH EXTRACTION  12/08/2018   all 4    Allergies: Allergies  Allergen Reactions   Clindamycin/Lincomycin Anaphylaxis    Trouble breathing?    Outpatient Meds: Current Facility-Administered Medications  Medication Dose Route Frequency Provider Last Rate Last Admin   0.9 %  sodium chloride  infusion   Intravenous Continuous Danis, Victory CROME III, MD          ___________________________________________________________________ Objective   Exam:  Wt (!) 158 kg   BMI 54.56 kg/m   CV: regular , S1/S2 Resp: clear to auscultation bilaterally, normal RR and effort noted GI: soft, no tenderness, with active bowel sounds.   Assessment: Chronic diarrhea Rectal bleeding Generalized abdominal pain   Plan: Colonoscopy   The benefits and risks of the planned procedure were described in detail with the patient or (when appropriate) their health care proxy.  Risks were outlined as including, but not limited to, bleeding, infection, perforation, adverse medication reaction leading to  cardiac or pulmonary decompensation, pancreatitis (if ERCP).  The limitation of incomplete mucosal visualization was also discussed.  No guarantees or warranties were given.  The patient is appropriate for an endoscopic procedure in the ambulatory setting.   - Victory Brand, MD

## 2023-05-10 NOTE — Transfer of Care (Signed)
 Immediate Anesthesia Transfer of Care Note  Patient: ZURIE PLATAS  Procedure(s) Performed: COLONOSCOPY WITH PROPOFOL  BIOPSY  Patient Location: PACU  Anesthesia Type:MAC  Level of Consciousness: awake, alert , and oriented  Airway & Oxygen Therapy: Patient Spontanous Breathing and Patient connected to face mask oxygen  Post-op Assessment: Report given to RN and Post -op Vital signs reviewed and stable  Post vital signs: Reviewed and stable  Last Vitals:  Vitals Value Taken Time  BP 138/69 05/10/23 1111  Temp    Pulse    Resp 16 05/10/23 1112  SpO2    Vitals shown include unfiled device data.  Last Pain:  Vitals:   05/10/23 0906  TempSrc: Tympanic  PainSc: 0-No pain         Complications: No notable events documented.

## 2023-05-10 NOTE — Anesthesia Postprocedure Evaluation (Signed)
 Anesthesia Post Note  Patient: ARDEAN SIMONICH  Procedure(s) Performed: COLONOSCOPY WITH PROPOFOL  BIOPSY     Patient location during evaluation: PACU Anesthesia Type: MAC Level of consciousness: awake and alert Pain management: pain level controlled Vital Signs Assessment: post-procedure vital signs reviewed and stable Respiratory status: spontaneous breathing, nonlabored ventilation, respiratory function stable and patient connected to nasal cannula oxygen Cardiovascular status: stable and blood pressure returned to baseline Postop Assessment: no apparent nausea or vomiting Anesthetic complications: no   No notable events documented.  Last Vitals:  Vitals:   05/10/23 1120 05/10/23 1130  BP: 134/85 (!) 145/83  Pulse: 82 80  Resp: 18 14  Temp:    SpO2: 98% 97%    Last Pain:  Vitals:   05/10/23 1112  TempSrc:   PainSc: 0-No pain                 Thom JONELLE Peoples

## 2023-05-10 NOTE — Discharge Instructions (Signed)

## 2023-05-10 NOTE — Interval H&P Note (Signed)
 History and Physical Interval Note:  05/10/2023 10:45 AM  Terri Cook  has presented today for surgery, with the diagnosis of chronic diarrhea.  The various methods of treatment have been discussed with the patient and family. After consideration of risks, benefits and other options for treatment, the patient has consented to  Procedure(s): COLONOSCOPY WITH PROPOFOL  (N/A) as a surgical intervention.  The patient's history has been reviewed, patient examined, no change in status, stable for surgery.  I have reviewed the patient's chart and labs.  Questions were answered to the patient's satisfaction.     Victory LITTIE Brand III

## 2023-05-10 NOTE — Op Note (Signed)
 [facility] Patient Name: Terri Cook Procedure Date: 05/10/2023 MRN: [id number] Attending MD: Victory CROME. Legrand , MD, [id number] Date of Birth: 2001/03/17 CSN: [id number] Age: 23 Admit Type: Outpatient Procedure:                Colonoscopy Indications:              Chronic diarrhea (over a year), Rectal bleeding                            (occasional)                           See most recent clinic note for details Providers:                Victory L. Legrand, MD, Particia Fischer, RN, Curtistine Bishop, Technician Referring MD:              Medicines:                Monitored Anesthesia Care Complications:            No immediate complications. Estimated Blood Loss:     Estimated blood loss was minimal. Procedure:                Pre-Anesthesia Assessment:                           - Prior to the procedure, a History and Physical                            was performed, and patient medications and                            allergies were reviewed. The patient's tolerance of                            previous anesthesia was also reviewed. The risks                            and benefits of the procedure and the sedation                            options and risks were discussed with the patient.                            All questions were answered, and informed consent                            was obtained. Prior Anticoagulants: The patient has                            taken no anticoagulant or antiplatelet agents. ASA                            Grade Assessment:  III - A patient with severe                            systemic disease. After reviewing the risks and                            benefits, the patient was deemed in satisfactory                            condition to undergo the procedure.                           After obtaining informed consent, the colonoscope                            was passed under direct vision.  Throughout the                            procedure, the patient's blood pressure, pulse, and                            oxygen saturations were monitored continuously. The                            CF-HQ190L ([id number]) Olympus colonoscope was                            introduced through the anus and advanced to the the                            terminal ileum, with identification of the                            appendiceal orifice and IC valve. The colonoscopy                            was performed without difficulty. The patient                            tolerated the procedure well. The quality of the                            bowel preparation was excellent. The terminal                            ileum, ileocecal valve, appendiceal orifice, and                            rectum were photographed. Scope In: 10:54:11 AM Scope Out: 11:04:55 AM Scope Withdrawal Time: 0 hours 8 minutes 54 seconds  Total Procedure Duration: 0 hours 10 minutes 44 seconds  Findings:      The perianal and digital rectal examinations were normal.      The terminal ileum appeared normal.      Normal mucosa was found  in the entire colon. Biopsies for histology were       taken with a cold forceps from the right colon and left colon for       evaluation of microscopic colitis.      Internal hemorrhoids were found. The hemorrhoids were very small.      The exam was otherwise without abnormality. Impression:               No inflammatory bowel disease seen.                           - The examined portion of the ileum was normal.                           - Normal mucosa in the entire examined colon.                            Biopsied.                           - Internal hemorrhoids.                           - The examination was otherwise normal. Moderate Sedation:      MAC sedation used Recommendation:           - Patient has a contact number available for                            emergencies.  The signs and symptoms of potential                            delayed complications were discussed with the                            patient. Return to normal activities tomorrow.                            Written discharge instructions were provided to the                            patient.                           - Resume previous diet.                           - Continue present medications.                           - Await pathology results. Procedure Code(s):        --- Professional ---                           [phone], Colonoscopy, flexible; with biopsy, single                            or multiple Diagnosis Code(s):        ---  Professional ---                           K52.9, Noninfective gastroenteritis and colitis,                            unspecified                           K62.5, Hemorrhage of anus and rectum CPT copyright 2022 American [employer]. All rights reserved. The codes documented in this report are preliminary and upon coder review may  be revised to meet current compliance requirements. Miray Mancino L. Legrand, MD 05/10/2023 11:12:17 AM This report has been signed electronically. Number of Addenda: 0

## 2023-05-10 NOTE — Anesthesia Preprocedure Evaluation (Signed)
Anesthesia Evaluation  Patient identified by MRN, date of birth, ID band Patient awake    Reviewed: Allergy & Precautions, H&P , NPO status , Patient's Chart, lab work & pertinent test results  Airway Mallampati: III  TM Distance: >3 FB Neck ROM: Full    Dental no notable dental hx.    Pulmonary asthma , Current Smoker   Pulmonary exam normal breath sounds clear to auscultation       Cardiovascular hypertension, (-) angina negative cardio ROS Normal cardiovascular exam Rhythm:Regular Rate:Normal     Neuro/Psych neg Seizures negative neurological ROS  negative psych ROS   GI/Hepatic Neg liver ROS,GERD  ,,  Endo/Other  negative endocrine ROS    Renal/GU negative Renal ROS  negative genitourinary   Musculoskeletal negative musculoskeletal ROS (+)    Abdominal  (+) + obese  Peds negative pediatric ROS (+)  Hematology  (+) Blood dyscrasia, anemia   Anesthesia Other Findings   Reproductive/Obstetrics negative OB ROS                              Anesthesia Physical Anesthesia Plan  ASA: 3  Anesthesia Plan: MAC   Post-op Pain Management:    Induction: Intravenous  PONV Risk Score and Plan: 1 and Propofol infusion and Treatment may vary due to age or medical condition  Airway Management Planned: Natural Airway  Additional Equipment:   Intra-op Plan:   Post-operative Plan:   Informed Consent: I have reviewed the patients History and Physical, chart, labs and discussed the procedure including the risks, benefits and alternatives for the proposed anesthesia with the patient or authorized representative who has indicated his/her understanding and acceptance.     Dental advisory given  Plan Discussed with: CRNA  Anesthesia Plan Comments:          Anesthesia Quick Evaluation

## 2023-05-11 ENCOUNTER — Telehealth: Payer: Self-pay | Admitting: Gastroenterology

## 2023-05-11 ENCOUNTER — Encounter: Payer: Self-pay | Admitting: Gastroenterology

## 2023-05-11 LAB — SURGICAL PATHOLOGY

## 2023-05-11 NOTE — Telephone Encounter (Signed)
 Inbound call from patient stating that she had a colonoscopy yesterday and believes she has a yeast infection and is wondering if we can write a prescription for her. Please advise.

## 2023-05-11 NOTE — Telephone Encounter (Signed)
 RN called patient and received voice message. RN left message letting patient know that she will need to contact her PCP regarding her possible yeast infection.

## 2023-05-13 ENCOUNTER — Ambulatory Visit: Payer: Managed Care, Other (non HMO) | Admitting: Family Medicine

## 2023-05-13 ENCOUNTER — Ambulatory Visit (INDEPENDENT_AMBULATORY_CARE_PROVIDER_SITE_OTHER): Payer: Managed Care, Other (non HMO) | Admitting: Family Medicine

## 2023-05-13 VITALS — BP 122/96 | HR 94 | Temp 97.5°F | Ht 67.0 in | Wt 348.2 lb

## 2023-05-13 DIAGNOSIS — R300 Dysuria: Secondary | ICD-10-CM | POA: Diagnosis not present

## 2023-05-13 DIAGNOSIS — N3001 Acute cystitis with hematuria: Secondary | ICD-10-CM

## 2023-05-13 DIAGNOSIS — N898 Other specified noninflammatory disorders of vagina: Secondary | ICD-10-CM | POA: Diagnosis not present

## 2023-05-13 LAB — POCT URINALYSIS DIPSTICK
Bilirubin, UA: NEGATIVE
Blood, UA: POSITIVE
Glucose, UA: NEGATIVE
Ketones, UA: NEGATIVE
Nitrite, UA: NEGATIVE
Protein, UA: NEGATIVE
Spec Grav, UA: 1.02 (ref 1.010–1.025)
Urobilinogen, UA: 0.2 U/dL
pH, UA: 5.5 (ref 5.0–8.0)

## 2023-05-13 MED ORDER — NITROFURANTOIN MONOHYD MACRO 100 MG PO CAPS: 100.0000 mg | ORAL_CAPSULE | Freq: Two times a day (BID) | ORAL | 0 refills | Status: AC

## 2023-05-13 MED ORDER — FLUCONAZOLE 150 MG PO TABS: ORAL_TABLET | ORAL | 0 refills | Status: DC

## 2023-05-13 NOTE — Patient Instructions (Addendum)
-  Prescribed Macrobid  100mg  tablet, take 1 tablet every 12 hours for 5 days for a urinary tract infection.  -Sent urine for a culture. Office will call with lab results and you will see them on MyChart.  -Prescribed Fluconazole  for possible yeast infection since you are experiencing vaginal itching. Recommend to take one tablet today and take a second tablet after antibiotic use if symptoms continue.  -Provided material on urinary tract infection and yeast infection. -If symptoms become worse over the weekend, develop a fever, nausea, vomiting, please go to the closes emergency department.  -I hope you get to feeling better soon.

## 2023-05-13 NOTE — Progress Notes (Signed)
 Acute Office Visit   Subjective:  Patient ID: Terri Cook, female    DOB: 02/22/2001, 23 y.o.   MRN: [id number]  Chief Complaint  Patient presents with   Dysuria    Pt reports painful to urinates, vaginal itching. Feeling urgency to urinate and urinate frequently. Denied vaginal odor or discharge. Sx started on Wednesday.     HPI Patient reports she had a colonoscopy on Tuesday. On Wednesday, she developed vaginal itching. Yesterday, the itching became worse and then she had burning when urinating.   Denies fever, nausea, or vomiting. Denies blood in urine. Denies vaginal odor or discharge.  Review of Systems  Genitourinary:  Positive for dysuria.   See HPI above      Objective:   BP (!) 122/96 (BP Location: Right Arm, Patient Position: Sitting, Cuff Size: Large)   Pulse 94   Temp (!) 97.5 F (36.4 C) (Oral)   Ht 5' 7 (1.702 m)   Wt (!) 348 lb 3.2 oz (157.9 kg)   LMP 04/12/2023 (Approximate)   SpO2 98%   BMI 54.54 kg/m    Physical Exam Vitals reviewed.  Constitutional:      General: She is not in acute distress.    Appearance: Normal appearance. She is obese. She is not ill-appearing, toxic-appearing or diaphoretic.  HENT:     Head: Normocephalic and atraumatic.  Eyes:     General:        Right eye: No discharge.        Left eye: No discharge.     Conjunctiva/sclera: Conjunctivae normal.  Cardiovascular:     Rate and Rhythm: Normal rate.  Pulmonary:     Effort: Pulmonary effort is normal. No respiratory distress.  Abdominal:     General: There is no distension.  Musculoskeletal:        General: Normal range of motion.  Skin:    General: Skin is warm and dry.  Neurological:     General: No focal deficit present.     Mental Status: She is alert and oriented to person, place, and time. Mental status is at baseline.  Psychiatric:        Mood and Affect: Mood normal.        Behavior: Behavior normal.        Thought Content: Thought content normal.         Judgment: Judgment normal.     Results for orders placed or performed in visit on 05/13/23  POC Urinalysis Dipstick  Result Value Ref Range   Color, UA Yellow    Clarity, UA Cloudy    Glucose, UA Negative Negative   Bilirubin, UA Negative    Ketones, UA Negative    Spec Grav, UA 1.020 1.010 - 1.025   Blood, UA Positive    pH, UA 5.5 5.0 - 8.0   Protein, UA Negative Negative   Urobilinogen, UA 0.2 0.2 or 1.0 E.U./dL   Nitrite, UA Negative    Leukocytes, UA Large (3+) (A) Negative   Appearance     Odor        Assessment & Plan:  Acute cystitis with hematuria -     Nitrofurantoin  Monohyd Macro; Take 1 capsule (100 mg total) by mouth 2 (two) times daily for 5 days.  Dispense: 10 capsule; Refill: 0  Vaginal itching -     Fluconazole ; Take 1 tablet today and then a second tablet in 72 hours if symptoms continue.  Dispense: 2 tablet; Refill: 0  Dysuria -  POCT urinalysis dipstick -     Urine Culture; Future  -Prescribed Macrobid  100mg  tablet, take 1 tablet every 12 hours for 5 days for a urinary tract infection.  -Sent urine for a culture. Will notify patient of results and change antibiotic if needed.  -Prescribed Fluconazole  for possible yeast infection since she is experiencing vaginal itching. Recommend to take one tablet today and take a second tablet after antibiotic use if symptoms continue.  -Provided material on urinary tract infection and yeast infection. -Advised if symptoms become worse over the weekend, develop a fever, nausea, vomiting, please go to the closes emergency department.   Tyarra Nolton, NP

## 2023-05-14 LAB — URINE CULTURE
MICRO NUMBER:: 16056873
Result:: NO GROWTH
SPECIMEN QUALITY:: ADEQUATE

## 2023-05-16 ENCOUNTER — Encounter: Payer: Self-pay | Admitting: Family Medicine

## 2023-05-16 ENCOUNTER — Encounter: Payer: Managed Care, Other (non HMO) | Admitting: Gastroenterology

## 2023-05-17 ENCOUNTER — Encounter: Payer: Self-pay | Admitting: Physician Assistant

## 2023-05-17 ENCOUNTER — Ambulatory Visit: Payer: Self-pay | Admitting: Family Medicine

## 2023-05-17 NOTE — Telephone Encounter (Signed)
Copied from CRM [id number]. Topic: Clinical - Red Word Triage >> May 17, 2023 10:50 AM Fonda Kinder J wrote: [facility] that prompted transfer to Nurse Triage: Pain/Itching/burning on vaginal    Chief Complaint: Vaginal symptoms Symptoms: Vaginal itching and burning Pertinent Negatives: Patient denies discharge or odor Disposition: [] ED /[] Urgent Care (no appt availability in office) / [x] Appointment(In office/virtual)/ []  [facility]/ [] Home Care/ [] Refused Recommended Disposition /[] [place] [facility]/ []  Follow-up with PCP  Additional Notes: Patient was seen on 2/7 and she was given prescriptions for Fluconazole and Nitrofurantoin. Patient was later advised to stop taking Nitrofurantoin because urine culture was negative. Patient called today because she is still having vaginal itching and burning. She stated the itching is worse. She has only taken 1 Fluconazole tablet on Friday. She is asking if she should take the other tablet. Advised patient that the provider may recommend for her to take the other tablet to complete the treatment and I will send a message to confirm. Please advise if patient should be seen again.   Answer Assessment - Initial Assessment Questions 1. SYMPTOM: "What's the main symptom you're concerned about?" (e.g., pain, itching, dryness)     Vaginal itching and burning  2. LOCATION: "Where is it located?" (e.g., inside/outside, left/right)     Inner labia  3. ITCHING: "Is there any itching?" If Yes, ask: "How bad is it?" (Scale: 1-10; mild, moderate, severe)     Very itchy  4. CAUSE: "Is there any discharge?"     No  5. OTHER SYMPTOMS: "Do you have any other symptoms?" (e.g., fever, itching, vaginal bleeding, pain with urination, injury to genital area, vaginal foreign body)     No  Protocols used: Vaginal Symptoms-A-AH

## 2023-05-18 ENCOUNTER — Telehealth: Payer: Self-pay | Admitting: Family Medicine

## 2023-05-18 NOTE — Telephone Encounter (Signed)
Pt returned CMA Terrah's call. Informed pt provider only worked a half day and is gone. She is requesting a call back, she states she works [employer], [employer] and would like a call back outside of those hours.

## 2023-05-18 NOTE — Telephone Encounter (Signed)
Left a voice message asking to give our office a call back. I will try calling again.

## 2023-05-19 NOTE — Telephone Encounter (Signed)
See other telephone encounter.

## 2023-05-19 NOTE — Telephone Encounter (Signed)
Return call to patient and informed her of Zandra Abts, NP recommendation of taking the second tablet of Diflucan, if not improved after taking medication to make a follow up appointment. Patient stated that she has already take the second tablet of Diflucan and it is not betting the itching is worse and hurts when she wipes and thinks there maybe some discharge. Patient wants to know if she can try something else before making an appointment. Please advise.

## 2023-05-23 ENCOUNTER — Encounter: Payer: Self-pay | Admitting: Physician Assistant

## 2023-05-23 ENCOUNTER — Ambulatory Visit: Payer: Managed Care, Other (non HMO) | Admitting: Family Medicine

## 2023-05-24 ENCOUNTER — Ambulatory Visit (INDEPENDENT_AMBULATORY_CARE_PROVIDER_SITE_OTHER): Payer: Managed Care, Other (non HMO) | Admitting: Family Medicine

## 2023-05-24 ENCOUNTER — Encounter: Payer: Self-pay | Admitting: Physician Assistant

## 2023-05-24 ENCOUNTER — Other Ambulatory Visit (HOSPITAL_COMMUNITY)
Admission: RE | Admit: 2023-05-24 | Discharge: 2023-05-24 | Disposition: A | Discharge status: Home / Self Care | Payer: Managed Care, Other (non HMO) | Source: Ambulatory Visit | Attending: Family Medicine | Admitting: Family Medicine

## 2023-05-24 ENCOUNTER — Encounter: Payer: Self-pay | Admitting: Family Medicine

## 2023-05-24 VITALS — BP 122/80 | HR 72 | Temp 97.6°F | Ht 67.0 in | Wt 352.4 lb

## 2023-05-24 DIAGNOSIS — R8290 Unspecified abnormal findings in urine: Secondary | ICD-10-CM | POA: Diagnosis not present

## 2023-05-24 DIAGNOSIS — N9489 Other specified conditions associated with female genital organs and menstrual cycle: Secondary | ICD-10-CM | POA: Diagnosis not present

## 2023-05-24 DIAGNOSIS — N898 Other specified noninflammatory disorders of vagina: Secondary | ICD-10-CM | POA: Insufficient documentation

## 2023-05-24 LAB — POC URINALSYSI DIPSTICK (AUTOMATED)
Bilirubin, UA: NEGATIVE
Blood, UA: NEGATIVE
Glucose, UA: NEGATIVE
Ketones, UA: NEGATIVE
Nitrite, UA: NEGATIVE
Protein, UA: POSITIVE — AB
Spec Grav, UA: 1.025 (ref 1.010–1.025)
Urobilinogen, UA: NEGATIVE U/dL — AB
pH, UA: 6 (ref 5.0–8.0)

## 2023-05-24 MED ORDER — NITROFURANTOIN MONOHYD MACRO 100 MG PO CAPS: 100.0000 mg | ORAL_CAPSULE | Freq: Two times a day (BID) | ORAL | 0 refills | Status: AC

## 2023-05-24 MED ORDER — FLUCONAZOLE 150 MG PO TABS
ORAL_TABLET | ORAL | 0 refills | Status: DC
Start: 2023-05-24 — End: 2023-06-06

## 2023-05-24 NOTE — Addendum Note (Signed)
Addended by: Zandra Abts R on: 05/24/2023 12:58 PM   Modules accepted: Orders

## 2023-05-24 NOTE — Patient Instructions (Addendum)
-  Uranalysis came with leukocytes. Will send urine for culture. Start treatment with a Macrobid 100mg , take 1 tablet every 12 hours for 5 days. We will be in touch with your results.  -Prescribed Diflucan 150mg  tablet, take 1 tablet today and every 72 hours for 3 doses.  -You self swab vaginally for to detect any yeast, bacterial vaginosis,  GC/chlamydia, or trichomonas. Office will call with results.  -Take care and we will be in touch with results.

## 2023-05-24 NOTE — Progress Notes (Signed)
Acute Office Visit   Subjective:  Patient ID: Terri Cook, female    DOB: 2000/06/13, 23 y.o.   MRN: [id number]  Chief Complaint  Patient presents with   Vaginal Itching    Itching and burning sensation has pain when wiping and after, pain when urinating     Vaginal Itching   Patient reports she is still experiencing some burning when urinating, vaginal itching and pain.Uncertain of vaginal discharge.   Denies fever, abd pain, nausea, vomiting, lower back pain or blood in urine.   Patient was seen on 02/07 by this provider for acute cystitis with hematuria and vaginal itching. Initially urinalysis had leukocytes and blood present, but culture came back with no growth. Prescribed Macrobid, which she took for 3 days. Discontinued medication since her culture came back with no growth. Also, prescribed Diflucan. She took the first tablet on 02/07 and then second tablet on 02/11. She reports the Diflucan helped for that day and then symptoms returned.  ROS See HPI above      Objective:   BP 122/80 (BP Location: Right Arm, Patient Position: Sitting, Cuff Size: Large)   Pulse 72   Temp 97.6 F (36.4 C) (Oral)   Ht 5\' 7"  (1.702 m)   Wt (!) 352 lb 6.4 oz (159.8 kg)   LMP 04/12/2023 (Approximate)   SpO2 98%   BMI 55.19 kg/m    Physical Exam Vitals reviewed.  Constitutional:      General: She is not in acute distress.    Appearance: Normal appearance. She is obese. She is not ill-appearing, toxic-appearing or diaphoretic.  HENT:     Head: Normocephalic and atraumatic.  Eyes:     General:        Right eye: No discharge.        Left eye: No discharge.     Conjunctiva/sclera: Conjunctivae normal.  Cardiovascular:     Rate and Rhythm: Normal rate.  Pulmonary:     Effort: Pulmonary effort is normal. No respiratory distress.  Musculoskeletal:        General: Normal range of motion.  Skin:    General: Skin is warm and dry.  Neurological:     General: No focal deficit  present.     Mental Status: She is alert and oriented to person, place, and time. Mental status is at baseline.  Psychiatric:        Mood and Affect: Mood normal.        Behavior: Behavior normal.        Thought Content: Thought content normal.        Judgment: Judgment normal.     Results for orders placed or performed in visit on 05/24/23  POCT Urinalysis Dipstick (Automated)  Result Value Ref Range   Color, UA Yellow    Clarity, UA Slightly Cloudy    Glucose, UA Negative Negative   Bilirubin, UA Negative    Ketones, UA Negative    Spec Grav, UA 1.025 1.010 - 1.025   Blood, UA Negative    pH, UA 6.0 5.0 - 8.0   Protein, UA Positive (A) Negative   Urobilinogen, UA negative (A) 0.2 or 1.0 E.U./dL   Nitrite, UA Negative    Leukocytes, UA Trace (A) Negative      Assessment & Plan:  Abnormal urinalysis -     Nitrofurantoin Monohyd Macro; Take 1 capsule (100 mg total) by mouth 2 (two) times daily for 5 days.  Dispense: 10 capsule; Refill: 0  Vaginal  itching -     POCT Urinalysis Dipstick (Automated) -     Fluconazole; Take 1 tablet (150 mg) orally every 72 hours for 3 doses  Dispense: 3 tablet; Refill: 0 -     Cervicovaginal ancillary only; Future  Vaginal burning -     POCT Urinalysis Dipstick (Automated)  -Uranalysis came with leukocytes present, no blood. Will send urine for culture. Start treatment with a Macrobid 100mg , take 1 tablet every 12 hours for 5 days. -Prescribed Diflucan 150mg  tablet, take 1 tablet today and every 72 hours for 3 doses.  -Patient self swab vaginally for to detect any yeast, bacterial vaginosis, GC/chlamydia, or trichomonas.  -Office will call with results.   Zandra Abts, NP

## 2023-05-25 ENCOUNTER — Encounter: Payer: Self-pay | Admitting: Family Medicine

## 2023-05-25 ENCOUNTER — Other Ambulatory Visit: Payer: Self-pay

## 2023-05-25 ENCOUNTER — Telehealth: Payer: Self-pay

## 2023-05-25 DIAGNOSIS — N760 Acute vaginitis: Secondary | ICD-10-CM

## 2023-05-25 LAB — CERVICOVAGINAL ANCILLARY ONLY
Bacterial Vaginitis (gardnerella): POSITIVE — AB
Candida Glabrata: NEGATIVE
Candida Vaginitis: NEGATIVE
Chlamydia: NEGATIVE
Comment: NEGATIVE
Comment: NEGATIVE
Comment: NEGATIVE
Comment: NEGATIVE
Comment: NEGATIVE
Comment: NORMAL
Neisseria Gonorrhea: NEGATIVE
Trichomonas: NEGATIVE

## 2023-05-25 LAB — URINE CULTURE
MICRO NUMBER:: 16099241
Result:: NO GROWTH
SPECIMEN QUALITY:: ADEQUATE

## 2023-05-25 MED ORDER — METRONIDAZOLE 500 MG PO TABS: 500.0000 mg | ORAL_TABLET | Freq: Three times a day (TID) | ORAL | 0 refills | Status: AC

## 2023-05-25 NOTE — Telephone Encounter (Addendum)
Called and left a voice message asking to give the office a call and a MyChart message was sent to patient by provider   ----- Message from Zandra Abts sent at 05/25/2023  7:29 AM EST ----- Your vaginal swab came back with bacterial vaginitis. Recommend to take Flagyl 500mg  tablet, take 1 tablet every 8 hours for 7 days. (Dx: Bacterial vaginitis) If you drink alcohol, please do not drink alcohol while taking this medication. It can make you very sick. Continue to take Macrobid until we can get your urine culture back. This should help your symptoms.

## 2023-06-06 ENCOUNTER — Encounter (HOSPITAL_BASED_OUTPATIENT_CLINIC_OR_DEPARTMENT_OTHER): Payer: Self-pay | Admitting: Obstetrics & Gynecology

## 2023-06-06 ENCOUNTER — Ambulatory Visit (HOSPITAL_BASED_OUTPATIENT_CLINIC_OR_DEPARTMENT_OTHER): Payer: Managed Care, Other (non HMO) | Admitting: Obstetrics & Gynecology

## 2023-06-06 ENCOUNTER — Other Ambulatory Visit (HOSPITAL_COMMUNITY)
Admission: RE | Admit: 2023-06-06 | Discharge: 2023-06-06 | Disposition: A | Discharge status: Home / Self Care | Source: Ambulatory Visit | Attending: Obstetrics & Gynecology | Admitting: Obstetrics & Gynecology

## 2023-06-06 VITALS — BP 114/81 | HR 70 | Ht 68.0 in | Wt 354.6 lb

## 2023-06-06 DIAGNOSIS — Z23 Encounter for immunization: Secondary | ICD-10-CM | POA: Diagnosis not present

## 2023-06-06 DIAGNOSIS — F17200 Nicotine dependence, unspecified, uncomplicated: Secondary | ICD-10-CM | POA: Diagnosis not present

## 2023-06-06 DIAGNOSIS — D500 Iron deficiency anemia secondary to blood loss (chronic): Secondary | ICD-10-CM | POA: Diagnosis not present

## 2023-06-06 DIAGNOSIS — Z113 Encounter for screening for infections with a predominantly sexual mode of transmission: Secondary | ICD-10-CM | POA: Insufficient documentation

## 2023-06-06 DIAGNOSIS — Z01419 Encounter for gynecological examination (general) (routine) without abnormal findings: Secondary | ICD-10-CM | POA: Diagnosis not present

## 2023-06-06 DIAGNOSIS — Z3041 Encounter for surveillance of contraceptive pills: Secondary | ICD-10-CM | POA: Diagnosis not present

## 2023-06-06 DIAGNOSIS — Z8619 Personal history of other infectious and parasitic diseases: Secondary | ICD-10-CM

## 2023-06-06 DIAGNOSIS — R21 Rash and other nonspecific skin eruption: Secondary | ICD-10-CM

## 2023-06-06 MED ORDER — VALACYCLOVIR HCL 500 MG PO TABS: ORAL_TABLET | ORAL | 2 refills | Status: AC

## 2023-06-06 MED ORDER — BALZIVA 0.4-35 MG-MCG PO TABS: 1.0000 | ORAL_TABLET | Freq: Every day | ORAL | 3 refills | Status: DC

## 2023-06-06 MED ORDER — HYDROCORTISONE 2.5 % EX CREA
TOPICAL_CREAM | CUTANEOUS | 0 refills | Status: DC
Start: 2023-06-06 — End: 2023-09-14

## 2023-06-06 NOTE — Progress Notes (Signed)
 ANNUAL EXAM Patient name: Terri Cook MRN [id number]  Date of birth: 12/30/2000 Chief Complaint:   Gynecologic Exam  History of Present Illness:   Terri Cook is a 23 y.o. G0P0000 Caucasian female being seen today for a routine annual exam.  She was seen prior in 2021.  She has been seeing Dr. Oscar La in the interim and she is now retired.    H/o iron deficiency anemia.  She did have a colonoscopy and it was negative but she has IBS.  She has follow up with Dr. Myrtie Neither.  On OCPs.  She does smoke socially.  Risks discussed.   She has an itchy rash on her breasts she's like me to look at today.    Patient's last menstrual period was 06/03/2023 (approximate).   The pregnancy intention screening data noted above was reviewed. Potential methods of contraception were discussed. The patient elected to proceed with No data recorded.   Last pap 05/14/22. Results were: NILM w/ HRHPV negative. H/O abnormal pap: no Last mammogram: guidelines reviewed to start around age 48.  Family h/o breast cancer: no Last colonoscopy: 05/10/2023. Results were: normal. Family h/o colorectal cancer: no     06/06/2023    8:54 AM 12/10/2022    3:57 PM 02/04/2022    9:10 AM 10/07/2021   11:36 AM 09/30/2021    8:23 AM  Depression screen PHQ 2/9  Decreased Interest 0 0 0 0 1  Down, Depressed, Hopeless 0 0 0 0 0  PHQ - 2 Score 0 0 0 0 1  Altered sleeping  0 0 0 1  Tired, decreased energy  0 0 0 1  Change in appetite  0 0 0 1  Feeling bad or failure about yourself   0 0 0 0  Trouble concentrating  0 0 0 0  Moving slowly or fidgety/restless  0 0 0 0  Suicidal thoughts  0 0 0 0  PHQ-9 Score  0 0 0 4  Difficult doing work/chores  Not difficult at all Not difficult at all Not difficult at all Not difficult at all       Review of Systems:   Pertinent items are noted in HPI  Denies any headaches, blurred vision, fatigue, shortness of breath, chest pain, abdominal pain, abnormal vaginal  discharge/itching/odor/irritation, problems with periods, urination, or intercourse unless otherwise stated above.  She does sometimes have constipation.    Pertinent History Reviewed:  Reviewed past medical,surgical, social and family history.   Reviewed problem list, medications and allergies. Physical Assessment:   Vitals:   06/06/23 0847  BP: 114/81  Pulse: 70  Weight: (!) 354 lb 9.6 oz (160.8 kg)  Height: 5\' 8"  (1.727 m)  Body mass index is 53.92 kg/m.        Physical Examination:   General appearance - well appearing, and in no distress  Mental status - alert, oriented to person, place, and time  Psych:  She has a normal mood and affect  Skin - warm and dry, normal color, no suspicious lesions noted  Chest - effort normal, all lung fields clear to auscultation bilaterally  Heart - normal rate and regular rhythm  Neck:  midline trachea, no thyromegaly or nodules  Breasts - breasts appear normal, no suspicious masses, no nipple changes or  axillary nodes, nummular rash on right areola noted  Abdomen - soft, nontender, nondistended, no masses or organomegaly  Pelvic - VULVA: normal appearing vulva with no masses, tenderness or lesions  VAGINA: normal appearing vagina with normal color and discharge, no lesions   CERVIX: normal appearing cervix without discharge or lesions, no CMT  Thin prep pap was not done/not indicated this year.  UTERUS: uterus is felt to be normal size, shape, consistency and nontender   ADNEXA: No adnexal masses or tenderness noted.  Rectal - deferred  Extremities:  No swelling or varicosities noted  Raechel Ache, RN, chaperone present for exam  Assessment & Plan:  1. Well woman exam with routine gynecological exam (Primary) - Pap smear updated last year - Mammogram guidelines reviewed - Colonoscopy done 2025 - lab work done - vaccines reviewed/updated  2. Iron deficiency anemia due to chronic blood loss - Iron, TIBC and Ferritin Panel - CBC  3.  Smoker - discussed risks including DVT.  Cessation highly encouraged.  4. Encounter for surveillance of contraceptive pills - BALZIVA 0.4-35 MG-MCG tablet; Take 1 tablet by mouth daily.  Dispense: 84 tablet; Refill: 3  5. History of herpes genitalis - valACYclovir (VALTREX) 500 MG tablet; Take one tablet po BID x 3 days with an outbreak  Dispense: 30 tablet; Refill: 2  6. Routine screening for STI (sexually transmitted infection) - Cervicovaginal ancillary only( [place])   7. Skin rash - rx for hydrocortisone 2.5% cream to pharmacy to use twice daily for up to 7 days   Meds:  Meds ordered this encounter  Medications   BALZIVA 0.4-35 MG-MCG tablet    Sig: Take 1 tablet by mouth daily.    Dispense:  84 tablet    Refill:  3   valACYclovir (VALTREX) 500 MG tablet    Sig: Take one tablet po BID x 3 days with an outbreak    Dispense:  30 tablet    Refill:  2    Follow-up: Return in about 1 year (around 06/05/2024).  Jerene Bears, MD 06/06/2023 9:16 AM

## 2023-06-07 ENCOUNTER — Encounter (HOSPITAL_BASED_OUTPATIENT_CLINIC_OR_DEPARTMENT_OTHER): Payer: Self-pay | Admitting: Obstetrics & Gynecology

## 2023-06-07 LAB — CERVICOVAGINAL ANCILLARY ONLY
Chlamydia: NEGATIVE
Comment: NEGATIVE
Comment: NORMAL
Neisseria Gonorrhea: NEGATIVE

## 2023-06-07 LAB — CBC
Hematocrit: 45.6 % (ref 34.0–46.6)
Hemoglobin: 15.3 g/dL (ref 11.1–15.9)
MCH: 28 pg (ref 26.6–33.0)
MCHC: 33.6 g/dL (ref 31.5–35.7)
MCV: 84 fL (ref 79–97)
Platelets: 383 10*3/uL (ref 150–450)
RBC: 5.46 x10E6/uL — ABNORMAL HIGH (ref 3.77–5.28)
RDW: 12.6 % (ref 11.7–15.4)
WBC: 7.6 10*3/uL (ref 3.4–10.8)

## 2023-06-07 LAB — IRON,TIBC AND FERRITIN PANEL
Ferritin: 46 ng/mL (ref 15–150)
Iron Saturation: 12 % — ABNORMAL LOW (ref 15–55)
Iron: 47 ug/dL (ref 27–159)
Total Iron Binding Capacity: 383 ug/dL (ref 250–450)
UIBC: 336 ug/dL (ref 131–425)

## 2023-08-01 ENCOUNTER — Ambulatory Visit (INDEPENDENT_AMBULATORY_CARE_PROVIDER_SITE_OTHER): Payer: Managed Care, Other (non HMO) | Admitting: Gastroenterology

## 2023-08-01 ENCOUNTER — Encounter: Payer: Self-pay | Admitting: Gastroenterology

## 2023-08-01 ENCOUNTER — Encounter (HOSPITAL_BASED_OUTPATIENT_CLINIC_OR_DEPARTMENT_OTHER): Payer: Self-pay

## 2023-08-01 VITALS — BP 118/74 | HR 91 | Ht 67.75 in | Wt 345.2 lb

## 2023-08-01 DIAGNOSIS — K9089 Other intestinal malabsorption: Secondary | ICD-10-CM

## 2023-08-01 DIAGNOSIS — E739 Lactose intolerance, unspecified: Secondary | ICD-10-CM | POA: Diagnosis not present

## 2023-08-01 DIAGNOSIS — K529 Noninfective gastroenteritis and colitis, unspecified: Secondary | ICD-10-CM

## 2023-08-01 MED ORDER — COLESTIPOL HCL 1 G PO TABS: 1.0000 g | ORAL_TABLET | Freq: Two times a day (BID) | ORAL | 5 refills | Status: AC

## 2023-08-01 NOTE — Progress Notes (Signed)
 [facility] Progress Note  Chief Complaint: Abdominal pain and diarrhea  Summary of GI history:  Chronic diarrhea felt to be bile acid malabsorption prior cholecystectomy.  Most likely lactose intolerant by history Little improvement on dicyclomine . Did well on WelChol  twice daily but insurance stopped covering it and changed to colestipol  twice daily Negative TTG IgA antibody and normal IgA level January 2024 Empiric course of rifaximin  for possible SIBO June 2024 improved diarrhea somewhat but not be abdominal pain. Had intermittent rectal bleeding when seen in office November 2024. Colonoscopy to the terminal ileum (at [facility] due to elevated BMI) on 05/10/2023 was normal except for internal hemorrhoids, including negative biopsies for microscopic colitis.  Subjective  HPI:  Dominic Friendly says she has had return of abdominal cramps and several loose stools per day with urgency.  Hemorrhoidal bleeding has stopped since her colonoscopy. She has again noted to me that milk and other dairy products may trigger the symptoms (see prior notes for those discussions).  She likes to have ice cream as a snack before bed and this morning had 2 loose bowel movements shortly after awakening.  When asked in detail about her meds, it turns out she is no longer taking the colestipol  since the colonoscopy.  It was held for the procedure, but she did not know if it should be resumed and had not heard from our office about that.   ROS: Cardiovascular:  no chest pain Respiratory: no dyspnea  The patient's Past Medical, Family and Social History were reviewed and are on file in the EMR. Past Medical History:  Diagnosis Date   Abnormal uterine bleeding    Acid reflux    Anemia    Asthma    Fatty liver    High blood pressure    High cholesterol    Ingrown left big toenail 09/2016   recurrent   PCOS (polycystic ovarian syndrome)    Pre-diabetes    Seasonal allergies     Past  Surgical History:  Procedure Laterality Date   BIOPSY  05/10/2023   Procedure: BIOPSY;  Surgeon: Albertina Hugger, MD;  Location: [facility];  Service: Gastroenterology;;   COLONOSCOPY WITH PROPOFOL  N/A 05/10/2023   Procedure: COLONOSCOPY WITH PROPOFOL ;  Surgeon: Albertina Hugger, MD;  Location: [facility];  Service: Gastroenterology;  Laterality: N/A;   GALLBLADDER SURGERY  2021   LESION EXCISION Left 07/11/2014   Procedure: EXCISION OF BENIGN LESIONS INCLUDING MARGINS ;  Surgeon: Alanda Allegra, MD;  Location: [facility];  Service: Pediatrics;  Laterality: Left;  mid back  and right neck   TOENAIL EXCISION  01/27/2012   Procedure: MINOR TOENAIL EXCISION;  Surgeon: Melven Stable. Alanda Allegra, MD;  Location: [facility];  Service: Pediatrics;  Laterality: Left;   TOENAIL EXCISION  03/23/2012   Procedure: MINOR TOENAIL EXCISION;  Surgeon: Melven Stable. Alanda Allegra, MD;  Location: [facility];  Service: Pediatrics;  Laterality: Right;   TOENAIL EXCISION Right 09/21/2012   Procedure: Partial Excision of Right Toenail;  Surgeon: Melven Stable. Alanda Allegra, MD;  Location:  SURGERY CENTER;  Service: Pediatrics;  Laterality: Right;   TOENAIL EXCISION Left 02/15/2013   Procedure: PARTIAL TOENAIL EXCISION ON THE LEFT BIG TOE AT THE OUTER LATERAL FOLD WITH PHENOL (MP ROOM) ;  Surgeon: Melven Stable. Alanda Allegra, MD;  Location:  SURGERY CENTER;  Service: Pediatrics;  Laterality: Left;   TOENAIL EXCISION Left 08/16/2013   Procedure: PARTIAL EXCISION OF LEFT  BIG TOE NAIL FROM OUTER LATERAL FOLD;  Surgeon: Melven Stable. Alanda Allegra, MD;  Location: [facility];  Service: Pediatrics;  Laterality: Left;   TOENAIL EXCISION Left 09/16/2016   Procedure: PARTIAL PERMANENT EXCISION OF LEFT INGROWN BIG TOENAIL;  Surgeon: Alanda Allegra, MD;  Location: [facility];  Service: General;  Laterality: Left;   WISDOM TOOTH EXTRACTION  12/08/2018   all 4     Objective:  Med list reviewed  Current Outpatient Medications:    Acetaminophen -Caff-Pyrilamine (MIDOL MAX ST MENSTRUAL PO), Take 2 tablets by mouth 2 (two) times daily as needed (cramps)., Disp: , Rfl:    ADVAIR  HFA 230-21 MCG/ACT inhaler, Inhale 2 puffs into the lungs 2 (two) times daily., Disp: 12 g, Rfl: 5   albuterol  (VENTOLIN  HFA) 108 (90 Base) MCG/ACT inhaler, Inhale 2 puffs into the lungs every 6 (six) hours as needed for wheezing or shortness of breath., Disp: 8 g, Rfl: 6   Ascorbic Acid (VITA-C PO), Take by mouth., Disp: , Rfl:    aspirin-acetaminophen -caffeine (EXCEDRIN EXTRA STRENGTH) 250-250-65 MG tablet, Take 2 tablets by mouth every 8 (eight) hours as needed for headache or migraine., Disp: , Rfl:    BALZIVA  0.4-35 MG-MCG tablet, Take 1 tablet by mouth daily., Disp: 84 tablet, Rfl: 3   cetirizine (ZYRTEC) 10 MG tablet, Take 10 mg by mouth daily as needed for allergies., Disp: , Rfl:    cholecalciferol (VITAMIN D3) 25 MCG (1000 UNIT) tablet, Take 1,000 Units by mouth daily., Disp: , Rfl:    hydrocortisone  2.5 % cream, Use small amount on skin rash twice daily for up to 7 days., Disp: 28 g, Rfl: 0   ibuprofen (ADVIL,MOTRIN) 200 MG tablet, Take 400 mg by mouth every 6 (six) hours as needed for mild pain (pain score 1-3) or headache., Disp: , Rfl:    valACYclovir  (VALTREX ) 500 MG tablet, Take one tablet po BID x 3 days with an outbreak, Disp: 30 tablet, Rfl: 2   zinc  gluconate 50 MG tablet, Take 50 mg by mouth daily., Disp: , Rfl:    colestipol  (COLESTID ) 1 g tablet, Take 1 tablet (1 g total) by mouth 2 (two) times daily. Take 1 hour before or 4 hours after other medications., Disp: 60 tablet, Rfl: 5   FeFum-FePoly-FA-B Cmp-C-Biot (INTEGRA PLUS ) CAPS, Take 1 capsule by mouth every morning. (Patient not taking: Reported on 08/01/2023), Disp: 30 capsule, Rfl: 2   Vital signs in last 24 hrs: Vitals:   08/01/23 0823  BP: 118/74  Pulse: 91  SpO2: 98%   Wt Readings from Last 3  Encounters:  08/01/23 (!) 345 lb 4 oz (156.6 kg)  06/06/23 (!) 354 lb 9.6 oz (160.8 kg)  05/24/23 (!) 352 lb 6.4 oz (159.8 kg)    Physical Exam  Well-appearing  Cardiac: Regular without appreciable murmur,  no peripheral edema Pulm: clear to auscultation bilaterally, normal RR and effort noted Abdomen: soft, morbidly obese, no tenderness, with active bowel sounds. No guarding or palpable hepatosplenomegaly. Skin; warm and dry, no jaundice or rash  Labs:   ___________________________________________ Radiologic studies:   ____________________________________________ Other:  Colon biopsies negative for microscopic colitis as noted above _____________________________________________ Assessment & Plan  Assessment: Encounter Diagnoses  Name Primary?   Chronic diarrhea Yes   Lactose intolerance    Bile salt-induced diarrhea     As before, I suspect that Libbi has a combination of bile acid diarrhea and lactose intolerance (planning her diarrhea and abdominal cramps).  As we have  done in previous visits, I showed her diagram of the anatomy and explained the nature of these 2 conditions, which she found helpful as on previous occasions.  I reinforced the rationale for the colestipol  and apologize for misunderstanding about whether or not she should continue it.  I renewed the prescription for a gram twice daily.  If she develops constipation or notices a significant improvement in diarrhea after completely eliminating lactose from her diet, then she can certainly drop the colestipol  to once daily or discontinue it altogether if tolerated.  6 months of medications were prescribed, she will contact us  as needed and pharmacy can let us  know when refills needed as well.  As end of the 4, I reinforced the need to take the cholesterol at least 2 hours away from any other medicines, most notably her birth control.  32  minutes were spent on this encounter (including chart review,  history/exam, counseling/coordination of care, and documentation) > 50% of that time was spent on counseling and coordination of care.   Kerby Pearson III

## 2023-08-01 NOTE — Patient Instructions (Addendum)
 We have sent the following medications to your pharmacy for you to pick up at your convenience: Colestid   _______________________________________________________  If your blood pressure at your visit was 140/90 or greater, please contact your primary care physician to follow up on this.  _______________________________________________________  If you are age 23 or older, your body mass index should be between 23-30. Your Body mass index is 52.88 kg/m. If this is out of the aforementioned range listed, please consider follow up with your Primary Care Provider.  If you are age 62 or younger, your body mass index should be between 19-25. Your Body mass index is 52.88 kg/m. If this is out of the aformentioned range listed, please consider follow up with your Primary Care Provider.   ________________________________________________________  The [facility] providers would like to encourage you to use MYCHART to communicate with providers for non-urgent requests or questions.  Due to long hold times on the telephone, sending your provider a message by [facility] may be a faster and more efficient way to get a response.  Please allow 48 business hours for a response.  Please remember that this is for non-urgent requests.  _______________________________________________________  Thank you for choosing me and [facility].  Dr. Dominic Friendly

## 2023-08-02 ENCOUNTER — Ambulatory Visit (HOSPITAL_BASED_OUTPATIENT_CLINIC_OR_DEPARTMENT_OTHER): Admitting: Pulmonary Disease

## 2023-09-12 ENCOUNTER — Telehealth (HOSPITAL_BASED_OUTPATIENT_CLINIC_OR_DEPARTMENT_OTHER): Payer: Self-pay | Admitting: *Deleted

## 2023-09-12 NOTE — Telephone Encounter (Signed)
 Pt called in requesting appt to be seen for irregular vaginal bleeding. She reports that she has had her cycle for 1 month and has passed some clots. Pt denies symptoms of dizziness, lightheadedness, fatigue. Pt provided with appt for evaluation. Advised pt that if her bleeding is increased to soaking a pad an hour, passing clots golf ball sized or larger, or if she experiences dizziness/lightheadedness before her appt, she should go to ED for evaluation.

## 2023-09-14 ENCOUNTER — Ambulatory Visit (HOSPITAL_BASED_OUTPATIENT_CLINIC_OR_DEPARTMENT_OTHER): Admitting: Obstetrics & Gynecology

## 2023-09-14 VITALS — BP 109/69 | HR 59 | Ht 68.0 in | Wt 343.0 lb

## 2023-09-14 DIAGNOSIS — Z6841 Body Mass Index (BMI) 40.0 and over, adult: Secondary | ICD-10-CM

## 2023-09-14 DIAGNOSIS — N939 Abnormal uterine and vaginal bleeding, unspecified: Secondary | ICD-10-CM | POA: Diagnosis not present

## 2023-09-14 DIAGNOSIS — N921 Excessive and frequent menstruation with irregular cycle: Secondary | ICD-10-CM

## 2023-09-14 DIAGNOSIS — E88819 Insulin resistance, unspecified: Secondary | ICD-10-CM

## 2023-09-14 LAB — HEMOGLOBIN A1C
Est. average glucose Bld gHb Est-mCnc: 105 mg/dL
Hgb A1c MFr Bld: 5.3 % (ref 4.8–5.6)

## 2023-09-14 LAB — CBC
Hematocrit: 45.3 % (ref 34.0–46.6)
Hemoglobin: 15.6 g/dL (ref 11.1–15.9)
MCH: 28.8 pg (ref 26.6–33.0)
MCHC: 34.4 g/dL (ref 31.5–35.7)
MCV: 84 fL (ref 79–97)
Platelets: 345 10*3/uL (ref 150–450)
RBC: 5.41 x10E6/uL — ABNORMAL HIGH (ref 3.77–5.28)
RDW: 13.4 % (ref 11.7–15.4)
WBC: 8.2 10*3/uL (ref 3.4–10.8)

## 2023-09-14 NOTE — Progress Notes (Signed)
 GYNECOLOGY  VISIT  CC:   abnormal bleeding x 1 month  HPI: 23 y.o. G0P0000 Single White or Caucasian female here for complaint of bleeding x 1 month.  Is on OCPs and has been doing well with them until the last month.  Pt states she is not missing any pills.  Last rx done 06/2023 and pt feels she may have one more pack left.  That rx was done for #3 packs.  Will need to check with pharmacy about this and if she has gotten a different generic.  She is SA.  No STI concerns.  Had GC/Chl testing 06/2023.  Partner has regular testing, per pt, due to some prior activity and it was negative last month and was negative.     Past Medical History:  Diagnosis Date   Abnormal uterine bleeding    Acid reflux    Anemia    Asthma    Fatty liver    High blood pressure    High cholesterol    Ingrown left big toenail 09/2016   recurrent   PCOS (polycystic ovarian syndrome)    Pre-diabetes    Seasonal allergies     MEDS:   Current Outpatient Medications on File Prior to Visit  Medication Sig Dispense Refill   Acetaminophen -Caff-Pyrilamine (MIDOL MAX ST MENSTRUAL PO) Take 2 tablets by mouth 2 (two) times daily as needed (cramps).     ADVAIR  HFA 230-21 MCG/ACT inhaler Inhale 2 puffs into the lungs 2 (two) times daily. 12 g 5   albuterol  (VENTOLIN  HFA) 108 (90 Base) MCG/ACT inhaler Inhale 2 puffs into the lungs every 6 (six) hours as needed for wheezing or shortness of breath. 8 g 6   Ascorbic Acid (VITA-C PO) Take by mouth.     aspirin-acetaminophen -caffeine (EXCEDRIN EXTRA STRENGTH) 250-250-65 MG tablet Take 2 tablets by mouth every 8 (eight) hours as needed for headache or migraine.     BALZIVA  0.4-35 MG-MCG tablet Take 1 tablet by mouth daily. 84 tablet 3   cetirizine (ZYRTEC) 10 MG tablet Take 10 mg by mouth daily as needed for allergies.     cholecalciferol (VITAMIN D3) 25 MCG (1000 UNIT) tablet Take 1,000 Units by mouth daily.     FeFum-FePoly-FA-B Cmp-C-Biot (INTEGRA PLUS ) CAPS Take 1 capsule by  mouth every morning. 30 capsule 2   ibuprofen (ADVIL,MOTRIN) 200 MG tablet Take 400 mg by mouth every 6 (six) hours as needed for mild pain (pain score 1-3) or headache.     valACYclovir  (VALTREX ) 500 MG tablet Take one tablet po BID x 3 days with an outbreak 30 tablet 2   zinc  gluconate 50 MG tablet Take 50 mg by mouth daily.     colestipol  (COLESTID ) 1 g tablet Take 1 tablet (1 g total) by mouth 2 (two) times daily. Take 1 hour before or 4 hours after other medications. 60 tablet 5   [DISCONTINUED] pantoprazole (PROTONIX) 40 MG tablet Take 40 mg by mouth daily.     No current facility-administered medications on file prior to visit.    ALLERGIES: Clindamycin/lincomycin  SH:  single, non smoker  Review of Systems  Constitutional: Negative.     PHYSICAL EXAMINATION:    BP 109/69   Pulse (!) 59   Ht 5' 8 (1.727 m)   Wt (!) 343 lb (155.6 kg)   BMI 52.15 kg/m     General appearance: alert, cooperative and appears stated age CV:  Regular rate and rhythm Abdomen: soft, non-tender; bowel sounds normal; no  masses,  no organomegaly Lymph:  no inguinal LAD noted  Pelvic: External genitalia:  no lesions              Urethra:  normal appearing urethra with no masses, tenderness or lesions              Bartholins and Skenes: normal                 Vagina: normal mucosa without prolapse or lesions              Cervix: no lesions and dark blood present, small amount, not active flow              Bimanual Exam:  Uterus:  normal size, contour, position, consistency, mobility, non-tender              Adnexa: no mass, fullness, tenderness  Chaperone, Myrtie Atkinson, CMA, was present for exam.  Assessment/Plan: 1. Abnormal uterine bleeding (Primary) - will check for anemia - CBC - will check with pharmacy about last rx and if new generic was given.  (Pharmacy was called and last rx was dispensed 06/08/2023 with #3 packs so pt should be out of pills but has another week before placebo pills  start)  2. BMI 50.0-59.9, adult (HCC) - Hemoglobin A1c  3. Breakthrough bleeding on OCPs  4. Insulin  resistance

## 2023-09-16 ENCOUNTER — Ambulatory Visit (HOSPITAL_BASED_OUTPATIENT_CLINIC_OR_DEPARTMENT_OTHER): Payer: Self-pay | Admitting: Obstetrics & Gynecology

## 2023-09-16 ENCOUNTER — Encounter (HOSPITAL_BASED_OUTPATIENT_CLINIC_OR_DEPARTMENT_OTHER): Payer: Self-pay | Admitting: Obstetrics & Gynecology

## 2023-10-19 ENCOUNTER — Ambulatory Visit (HOSPITAL_BASED_OUTPATIENT_CLINIC_OR_DEPARTMENT_OTHER): Admitting: Obstetrics & Gynecology

## 2023-10-19 VITALS — BP 118/95 | HR 81 | Wt 348.4 lb

## 2023-10-19 DIAGNOSIS — Z3041 Encounter for surveillance of contraceptive pills: Secondary | ICD-10-CM | POA: Diagnosis not present

## 2023-10-19 DIAGNOSIS — N926 Irregular menstruation, unspecified: Secondary | ICD-10-CM

## 2023-10-19 MED ORDER — BALZIVA 0.4-35 MG-MCG PO TABS: ORAL_TABLET | ORAL | 3 refills | Status: AC

## 2023-10-19 NOTE — Progress Notes (Signed)
 ANNUAL EXAM Patient name: Terri Cook MRN [id number]  Date of birth: [birth year] Chief Complaint:   Contraception (nexplanon) and iron   (Would like to have iron  tested )  History of Present Illness:   Terri Cook is a 23 y.o. G0P0000 Caucasian female being seen today to discuss Nexplanon placement.  She had about a month of irregular bleeding with her OCPs in June.  This has stopped now.  She feels Nexplanon could be a good option for her.  We discussed the risks of irregular bleeding, about 30%.  Bleeding does seem to be improved at this point.  She does want to make sure she is not pregnant with the irregular bleeding.  Also, she's wondering about her iron  levels.  She has started taking oral iron .  Will check some blood work today.    After discussion, she is going to monitor her bleeding and continue on OCPs.  If lab work is normal and if irregular bleeding occurs again, she does think she will want to try the Nexplanon.       10/19/2023    9:59 AM 06/06/2023    8:54 AM 12/10/2022    3:57 PM 02/04/2022    9:10 AM 10/07/2021   11:36 AM  Depression screen PHQ 2/9  Decreased Interest 0 0 0 0 0  Down, Depressed, Hopeless 0 0 0 0 0  PHQ - 2 Score 0 0 0 0 0  Altered sleeping 0  0 0 0  Tired, decreased energy 0  0 0 0  Change in appetite 0  0 0 0  Feeling bad or failure about yourself  0  0 0 0  Trouble concentrating 0  0 0 0  Moving slowly or fidgety/restless 0  0 0 0  Suicidal thoughts 0  0 0 0  PHQ-9 Score 0  0 0 0  Difficult doing work/chores   Not difficult at all Not difficult at all Not difficult at all     Review of Systems:   Pertinent items are noted in HPI  Denies any headaches, blurred vision, fatigue, shortness of breath, chest pain. Pertinent History Reviewed:  Reviewed past medical,surgical, social and family history.  Reviewed problem list, medications and allergies. Physical Assessment:   Vitals:   10/19/23 0945  BP: (!) 118/95  Pulse: 81  SpO2: 100%   Weight: (!) 348 lb 6.4 oz (158 kg)  Body mass index is 52.97 kg/m.        Physical Examination:   General appearance - well appearing, and in no distress  Mental status - alert, oriented to person, place, and time  Psych:  She has a normal mood and affect   Assessment & Plan:  1. Irregular bleeding (Primary) - will check iron  levels and r/o pregnancy today - Iron  - Ferritin - Beta hCG quant (ref lab)  2. Encounter for surveillance of contraceptive pills - BALZIVA  0.4-35 MG-MCG tablet; 1 tablet daily, takes continuous active pills.  Dispense: 112 tablet; Refill: 3  - if has another episode of irregular bleeding, she will call and return for Nexplanon placement  Orders Placed This Encounter  Procedures   Iron    Ferritin   Beta hCG quant (ref lab)    Meds:  Meds ordered this encounter  Medications   BALZIVA  0.4-35 MG-MCG tablet    Sig: 1 tablet daily, takes continuous active pills.    Dispense:  112 tablet    Refill:  3    Ronal GORMAN Pinal, MD  10/19/2023 11:16 AM

## 2023-10-20 LAB — IRON: Iron: 31 ug/dL (ref 27–159)

## 2023-10-20 LAB — BETA HCG QUANT (REF LAB): hCG Quant: <1 m[IU]/mL

## 2023-10-20 LAB — FERRITIN: Ferritin: 26 ng/mL (ref 15–150)

## 2023-11-09 ENCOUNTER — Ambulatory Visit (HOSPITAL_BASED_OUTPATIENT_CLINIC_OR_DEPARTMENT_OTHER): Payer: Self-pay | Admitting: Obstetrics & Gynecology

## 2023-11-18 ENCOUNTER — Encounter: Payer: Self-pay | Admitting: Physician Assistant

## 2023-11-21 ENCOUNTER — Other Ambulatory Visit (HOSPITAL_COMMUNITY)
Admission: RE | Admit: 2023-11-21 | Discharge: 2023-11-21 | Disposition: A | Discharge status: Home / Self Care | Source: Ambulatory Visit | Attending: Obstetrics & Gynecology | Admitting: Obstetrics & Gynecology

## 2023-11-21 ENCOUNTER — Ambulatory Visit (INDEPENDENT_AMBULATORY_CARE_PROVIDER_SITE_OTHER)

## 2023-11-21 VITALS — BP 133/94 | HR 84 | Temp 98.6°F | Wt 351.0 lb

## 2023-11-21 DIAGNOSIS — N898 Other specified noninflammatory disorders of vagina: Secondary | ICD-10-CM | POA: Diagnosis present

## 2023-11-21 DIAGNOSIS — R399 Unspecified symptoms and signs involving the genitourinary system: Secondary | ICD-10-CM

## 2023-11-21 LAB — POCT URINALYSIS DIP (CLINITEK)
Bilirubin, UA: NEGATIVE
Blood, UA: NEGATIVE
Glucose, UA: NEGATIVE mg/dL
Ketones, POC UA: NEGATIVE mg/dL
Leukocytes, UA: NEGATIVE
Nitrite, UA: NEGATIVE
POC PROTEIN,UA: NEGATIVE
Spec Grav, UA: >=1.03 — AB (ref 1.010–1.025)
Urobilinogen, UA: 0.2 U/dL
pH, UA: 6 (ref 5.0–8.0)

## 2023-11-21 MED ORDER — FLUCONAZOLE 150 MG PO TABS: 150.0000 mg | ORAL_TABLET | Freq: Once | ORAL | 0 refills | Status: AC

## 2023-11-21 NOTE — Progress Notes (Addendum)
 NURSE VISIT- VAGINITIS/STD/POC  SUBJECTIVE:  Terri Cook is a 23 y.o. G0P0000 GYN patientfemale here for a vaginal swab for vaginitis screening.  She reports the following symptoms: burning, odor, and vulvar itching for 2 days. Denies abnormal vaginal bleeding, significant pelvic pain, fever.  OBJECTIVE:  BP (!) 133/94 (BP Location: Left Arm, Patient Position: Sitting, Cuff Size: Large)   Pulse 84   Temp 98.6 F (37 C)   Appears well, in no apparent distress.  ASSESSMENT: Vaginal swab for vaginitis screening  PLAN: Self-collected vaginal probe for Bacterial Vaginosis, Yeast sent to lab Treatment: Per protocol Diflucan  150 mg po x 1 sent to [facility] off [address]. Follow-up as needed if symptoms persist/worsen, or new symptoms develop.

## 2023-11-21 NOTE — Addendum Note (Signed)
 Addended by: Mattisyn Cardona E on: 11/21/2023 09:24 AM   Modules accepted: Orders

## 2023-11-23 ENCOUNTER — Telehealth (HOSPITAL_BASED_OUTPATIENT_CLINIC_OR_DEPARTMENT_OTHER): Payer: Self-pay

## 2023-11-23 DIAGNOSIS — N760 Acute vaginitis: Secondary | ICD-10-CM

## 2023-11-23 LAB — CERVICOVAGINAL ANCILLARY ONLY
Bacterial Vaginitis (gardnerella): POSITIVE — AB
Candida Glabrata: NEGATIVE
Candida Vaginitis: NEGATIVE
Comment: NEGATIVE
Comment: NEGATIVE
Comment: NEGATIVE

## 2023-11-23 MED ORDER — METRONIDAZOLE 500 MG PO TABS: 500.0000 mg | ORAL_TABLET | Freq: Two times a day (BID) | ORAL | 0 refills | Status: AC

## 2023-11-23 NOTE — Telephone Encounter (Signed)
 Patient left a voicemail on the nurse line requesting a return call regarding results from testing for yeast and BV. Spoke with patient advised testing was negative for yeast but positive for BV.  Rx for Metronidazole  500 mg BID x 7 days #14 0RF sent to verified pharmacy on file ([facility] on [address]).  Patient is requesting rx for Diflucan  be sent to pharmacy incase she gets a yeast infection from the metronidazole . Advised will review with Dr.Miller and return call. Patient verbalizes understanding.

## 2023-11-24 ENCOUNTER — Ambulatory Visit (HOSPITAL_BASED_OUTPATIENT_CLINIC_OR_DEPARTMENT_OTHER): Payer: Self-pay | Admitting: Obstetrics & Gynecology

## 2023-11-25 MED ORDER — FLUCONAZOLE 150 MG PO TABS: 150.0000 mg | ORAL_TABLET | Freq: Once | ORAL | 0 refills | Status: AC

## 2023-12-27 ENCOUNTER — Telehealth (HOSPITAL_BASED_OUTPATIENT_CLINIC_OR_DEPARTMENT_OTHER): Payer: Self-pay

## 2023-12-27 ENCOUNTER — Encounter: Payer: Self-pay | Admitting: Physician Assistant

## 2023-12-27 NOTE — Telephone Encounter (Signed)
 Patient called and stated that she has had several + pregnancy test and she is bleeding. Patient wants to know what her next steps should be.

## 2023-12-27 NOTE — Telephone Encounter (Signed)
 Spoke with patient. Patient reports she had a positive at home pregnancy test on 12/24/23. Yesterday began having light bleeding that has continued into today. Denies any heavy bleeding or pain. Advised patient she will need to be seen in the office for evaluation and lab work. Offered appointment today at 2:15 pm with Dr.Miller. Patient declines due to scheduling conflict. Appointment scheduled for tomorrow 12/28/23 at 10:35 am with Dr.Miller. Patient is agreeable to date and time. Advised if she is able to be seen today to return call to adjust appointment. If patient develops any pain or heavy bleeding will need to be seen in office or at [facility] for evaluation.

## 2023-12-27 NOTE — Telephone Encounter (Signed)
 Left message to call Aranda Bihm at [phone].  Reviewed with Dr.Miller. Patient will need to be seen in the office for additional evaluation and labwork.

## 2023-12-27 NOTE — Telephone Encounter (Signed)
 Patient would like for you to call her back; she missed your call.

## 2023-12-28 ENCOUNTER — Encounter (HOSPITAL_BASED_OUTPATIENT_CLINIC_OR_DEPARTMENT_OTHER): Payer: Self-pay | Admitting: Obstetrics & Gynecology

## 2023-12-28 ENCOUNTER — Ambulatory Visit (INDEPENDENT_AMBULATORY_CARE_PROVIDER_SITE_OTHER): Admitting: Obstetrics & Gynecology

## 2023-12-28 VITALS — BP 125/80 | HR 101 | Ht 68.0 in | Wt 356.0 lb

## 2023-12-28 DIAGNOSIS — N926 Irregular menstruation, unspecified: Secondary | ICD-10-CM

## 2023-12-28 LAB — POCT URINE PREGNANCY: Preg Test, Ur: NEGATIVE

## 2023-12-28 NOTE — Progress Notes (Signed)
 GYNECOLOGY  VISIT  CC:   Bleeding, positive home UPT  HPI: 23 y.o. G24P0010 Single White or Caucasian female here for vaginal bleeding with positive pregnancy test. She reports two positive pregnancy tests on Saturday and vaginal bleeding started Monday morning. She reports that she is not taking her OCP which she stopped in August.  Cycles are irregular since stopping OCPs.  Is with her boyfriend who has just become her fiance.  They would like to have a pregnancy.  UPT here is negative.  Discussed with pt she is likely having her menstrual cycle but will check HCG to confirm.     Past Medical History:  Diagnosis Date   Abnormal uterine bleeding    Acid reflux    Anemia    Asthma    Fatty liver    High blood pressure    High cholesterol    Ingrown left big toenail 09/2016   recurrent   PCOS (polycystic ovarian syndrome)    Pre-diabetes    Seasonal allergies     MEDS:   Current Outpatient Medications on File Prior to Visit  Medication Sig Dispense Refill   Acetaminophen -Caff-Pyrilamine (MIDOL MAX ST MENSTRUAL PO) Take 2 tablets by mouth 2 (two) times daily as needed (cramps).     ADVAIR  HFA 230-21 MCG/ACT inhaler Inhale 2 puffs into the lungs 2 (two) times daily. 12 g 5   albuterol  (VENTOLIN  HFA) 108 (90 Base) MCG/ACT inhaler Inhale 2 puffs into the lungs every 6 (six) hours as needed for wheezing or shortness of breath. 8 g 6   Ascorbic Acid (VITA-C PO) Take by mouth.     aspirin-acetaminophen -caffeine (EXCEDRIN EXTRA STRENGTH) 250-250-65 MG tablet Take 2 tablets by mouth every 8 (eight) hours as needed for headache or migraine.     cetirizine (ZYRTEC) 10 MG tablet Take 10 mg by mouth daily as needed for allergies.     cholecalciferol (VITAMIN D3) 25 MCG (1000 UNIT) tablet Take 1,000 Units by mouth daily.     colestipol  (COLESTID ) 1 g tablet Take 1 tablet (1 g total) by mouth 2 (two) times daily. Take 1 hour before or 4 hours after other medications. 60 tablet 5   FeFum-FePoly-FA-B  Cmp-C-Biot (INTEGRA PLUS ) CAPS Take 1 capsule by mouth every morning. 30 capsule 2   ibuprofen (ADVIL,MOTRIN) 200 MG tablet Take 400 mg by mouth every 6 (six) hours as needed for mild pain (pain score 1-3) or headache.     valACYclovir  (VALTREX ) 500 MG tablet Take one tablet po BID x 3 days with an outbreak 30 tablet 2   zinc  gluconate 50 MG tablet Take 50 mg by mouth daily.     BALZIVA  0.4-35 MG-MCG tablet 1 tablet daily, takes continuous active pills. 112 tablet 3   metroNIDAZOLE  (FLAGYL ) 500 MG tablet Take 1 tablet (500 mg total) by mouth 2 (two) times daily. (Patient not taking: Reported on 12/28/2023) 14 tablet 0   [DISCONTINUED] pantoprazole (PROTONIX) 40 MG tablet Take 40 mg by mouth daily.     No current facility-administered medications on file prior to visit.    ALLERGIES: Clindamycin/lincomycin  SH:  single, non smoker  Review of Systems  Constitutional: Negative.   Genitourinary: Negative.     PHYSICAL EXAMINATION:    BP 125/80 (BP Location: Left Arm, Patient Position: Sitting, Cuff Size: Large)   Pulse (!) 101   Ht 5' 8 (1.727 m)   Wt (!) 356 lb (161.5 kg)   LMP 11/28/2023 (Approximate)   SpO2 100%  BMI 54.13 kg/m     General appearance: alert, cooperative and appears stated age Lymph:  no inguinal LAD noted Pelvic: External genitalia:  no lesions              Urethra:  normal appearing urethra with no masses, tenderness or lesions              Bartholins and Skenes: normal                 Vagina: normal mucosa without prolapse or lesions              Cervix: no lesions, blood at os              Bimanual Exam:  Uterus:  normal size, contour, position, consistency, mobility, non-tender              Adnexa: no mass, fullness, tenderness               Chaperone was present for exam.  Assessment/Plan: 1. Irregular bleeding (Primary) - will check HCG today.  If negative, recommend monitoring cycles.   2. Missed period - POCT urine pregnancy - Beta hCG quant  (ref lab)  Total time with pt 18 minutes answering questions.  Documentation:  additional four minutes. Total time:  22 minutes

## 2023-12-29 ENCOUNTER — Ambulatory Visit (HOSPITAL_BASED_OUTPATIENT_CLINIC_OR_DEPARTMENT_OTHER): Payer: Self-pay | Admitting: Obstetrics & Gynecology

## 2023-12-29 LAB — BETA HCG QUANT (REF LAB): hCG Quant: <1 m[IU]/mL

## 2024-01-04 ENCOUNTER — Emergency Department
Admission: EM | Admit: 2024-01-04 | Discharge: 2024-01-05 | Disposition: A | Attending: Emergency Medicine | Admitting: Emergency Medicine

## 2024-01-04 ENCOUNTER — Other Ambulatory Visit: Payer: Self-pay

## 2024-01-04 DIAGNOSIS — W1830XA Fall on same level, unspecified, initial encounter: Secondary | ICD-10-CM | POA: Insufficient documentation

## 2024-01-04 DIAGNOSIS — J45909 Unspecified asthma, uncomplicated: Secondary | ICD-10-CM | POA: Diagnosis not present

## 2024-01-04 DIAGNOSIS — R55 Syncope and collapse: Secondary | ICD-10-CM | POA: Insufficient documentation

## 2024-01-04 DIAGNOSIS — D72829 Elevated white blood cell count, unspecified: Secondary | ICD-10-CM | POA: Diagnosis not present

## 2024-01-04 DIAGNOSIS — I10 Essential (primary) hypertension: Secondary | ICD-10-CM | POA: Insufficient documentation

## 2024-01-04 DIAGNOSIS — R1031 Right lower quadrant pain: Secondary | ICD-10-CM | POA: Diagnosis present

## 2024-01-04 DIAGNOSIS — S3011XA Contusion of abdominal wall, initial encounter: Secondary | ICD-10-CM | POA: Insufficient documentation

## 2024-01-04 LAB — CBC WITH DIFFERENTIAL/PLATELET
Abs Immature Granulocytes: 0.05 K/uL (ref 0.00–0.07)
Basophils Absolute: 0.1 K/uL (ref 0.0–0.1)
Basophils Relative: 1 %
Eosinophils Absolute: 0.3 K/uL (ref 0.0–0.5)
Eosinophils Relative: 3 %
HCT: 42.9 % (ref 36.0–46.0)
Hemoglobin: 14.9 g/dL (ref 12.0–15.0)
Immature Granulocytes: 0 %
Lymphocytes Relative: 22 %
Lymphs Abs: 2.5 K/uL (ref 0.7–4.0)
MCH: 27.4 pg (ref 26.0–34.0)
MCHC: 34.7 g/dL (ref 30.0–36.0)
MCV: 79 fL — ABNORMAL LOW (ref 80.0–100.0)
Monocytes Absolute: 0.8 K/uL (ref 0.1–1.0)
Monocytes Relative: 7 %
Neutro Abs: 7.5 K/uL (ref 1.7–7.7)
Neutrophils Relative %: 67 %
Platelets: 351 K/uL (ref 150–400)
RBC: 5.43 MIL/uL — ABNORMAL HIGH (ref 3.87–5.11)
RDW: 12.3 % (ref 11.5–15.5)
WBC: 11.3 K/uL — ABNORMAL HIGH (ref 4.0–10.5)
nRBC: 0 % (ref 0.0–0.2)

## 2024-01-04 LAB — BASIC METABOLIC PANEL WITH GFR
Anion gap: 9 (ref 5–15)
BUN: 6 mg/dL (ref 6–20)
CO2: 27 mmol/L (ref 22–32)
Calcium: 8.8 mg/dL — ABNORMAL LOW (ref 8.9–10.3)
Chloride: 103 mmol/L (ref 98–111)
Creatinine, Ser: 0.73 mg/dL (ref 0.44–1.00)
GFR, Estimated: >60 mL/min (ref >60)
Glucose, Bld: 145 mg/dL — ABNORMAL HIGH (ref 70–99)
Potassium: 4 mmol/L (ref 3.5–5.1)
Sodium: 139 mmol/L (ref 135–145)

## 2024-01-04 LAB — POC URINE PREG, ED: Preg Test, Ur: NEGATIVE

## 2024-01-04 MED ORDER — SODIUM CHLORIDE 0.9 % IV BOLUS
1000.0000 mL | Freq: Once | INTRAVENOUS | Status: AC
  Administered 2024-01-04: 1000 mL via INTRAVENOUS

## 2024-01-04 NOTE — ED Provider Notes (Signed)
 [facility] Provider Note    Event Date/Time   First MD Initiated Contact with Patient 01/04/24 2158     (approximate)   History   Loss of Consciousness   HPI  Terri Cook is a 23 y.o. female with a history of obesity, prediabetes, asthma, hypertension, high cholesterol, and PCOS who presents with a near syncopal episode and right lower quadrant pain after a fall.  The patient states that she was taking a shower when she leaned over, suddenly started to see colors in her vision, became very lightheaded, and then almost passed out.  This caused her to fall.  She hit the right lower quadrant of her abdomen on the side of the tub causing pain.  She did not fully lose consciousness.  She did not hit her head.  Currently she is feeling somewhat lightheaded.  She reports right lower quadrant pain.  She states there was initially some bruising although there is no longer.  She denies any chest pain or difficulty breathing.  She states that she ate normally today.  She has not been feeling sick.  She denies any recent prior syncopal or near syncopal episodes like this.  I reviewed the past medical records.  The patient's most recent outpatient encounter was on 9/24 with gynecology for initial evaluation after positive pregnancy test.   Physical Exam   Triage Vital Signs: ED Triage Vitals  Encounter Vitals Group     BP 01/04/24 2140 (!) 143/79     Girls Systolic BP Percentile --      Girls Diastolic BP Percentile --      Boys Systolic BP Percentile --      Boys Diastolic BP Percentile --      Pulse Rate 01/04/24 2136 97     Resp 01/04/24 2136 20     Temp 01/04/24 2140 99.1 F (37.3 C)     Temp Source 01/04/24 2140 Oral     SpO2 01/04/24 2129 99 %     Weight 01/04/24 2132 (!) 365 lb (165.6 kg)     Height 01/04/24 2132 5' 8 (1.727 m)     Head Circumference --      Peak Flow --      Pain Score 01/04/24 2132 7     Pain Loc --      Pain Education --       Exclude from Growth Chart --     Most recent vital signs: Vitals:   01/04/24 2136 01/04/24 2140  BP:  (!) 143/79  Pulse: 97 (!) 111  Resp: 20 (!) 21  Temp:  99.1 F (37.3 C)  SpO2: 99% 99%     General: Alert and oriented, no distress.  CV:  Good peripheral perfusion.  Resp:  Normal effort.  Abd:  Mild right lower quadrant tenderness.  No visible ecchymosis.  No distention.  Other:  EOMI.  PERRLA.  No facial droop.  Normal speech.  Motor intact in all extremities.   ED Results / Procedures / Treatments   Labs (all labs ordered are listed, but only abnormal results are displayed) Labs Reviewed  CBC WITH DIFFERENTIAL/PLATELET - Abnormal; Notable for the following components:      Result Value   WBC 11.3 (*)    RBC 5.43 (*)    MCV 79.0 (*)    All other components within normal limits  BASIC METABOLIC PANEL WITH GFR - Abnormal; Notable for the following components:   Glucose, Bld 145 (*)  Calcium 8.8 (*)    All other components within normal limits  URINALYSIS, ROUTINE W REFLEX MICROSCOPIC  HCG, QUANTITATIVE, PREGNANCY  POC URINE PREG, ED  TROPONIN I (HIGH SENSITIVITY)     EKG  ED ECG REPORT I, Waylon Cassis, the attending physician, personally viewed and interpreted this ECG.  Date: 01/04/2024 EKG Time: 2137 Rate: 94 Rhythm: normal sinus rhythm QRS Axis: normal Intervals: normal ST/T Wave abnormalities: normal Narrative Interpretation: no evidence of acute ischemia    RADIOLOGY    PROCEDURES:  Critical Care performed: No  Procedures   MEDICATIONS ORDERED IN ED: Medications  sodium chloride  0.9 % bolus 1,000 mL (1,000 mLs Intravenous New Bag/Given 01/04/24 2210)     IMPRESSION / MDM / ASSESSMENT AND PLAN / ED COURSE  I reviewed the triage vital signs and the nursing notes.  23 year old female with PMH as noted above presents with a near syncopal episode while in the shower, causing her to fall and hit her right lower quadrant on the  edge of the tub.  She has some tenderness there on exam.  She is tachycardic.  Her temperature is borderline elevated.  Other vital signs appear normal.  Neurologic exam is nonfocal.  She recently was seen for a positive pregnancy test.  Differential diagnosis includes, but is not limited to, dehydration, electrolyte abnormality, other metabolic cause, vasovagal syncope.  The right lower quadrant pain is consistent with abdominal wall contusion.  She did not have any pain before the fall.  The mechanism is relatively minor, with a fall from standing height.  Especially given that there is no bruising, I have a very low suspicion for visceral injury.  We did discuss potential imaging which I briefly considered before realizing that the patient was pregnant.  At this time the risk of significant visceral injury is low and there is no indication for CT.  Patient's presentation is most consistent with acute complicated illness / injury requiring diagnostic workup.   The patient is on the cardiac monitor to evaluate for evidence of arrhythmia and/or significant heart rate changes.  ----------------------------------------- 11:07 PM on 01/04/2024 -----------------------------------------  BMP shows no acute findings.  CBC shows mild leukocytosis but no anemia.  The patient states that she had a positive pregnancy test at home but then a negative at the doctor's office.  I have ordered a serum hCG and I have also added on a troponin and urinalysis.  She is receiving fluids.  She is not having any abdominal pain when she is still.  If the rest of the workup is reassuring, anticipate likely discharge home.  I will sign the patient out to the oncoming ED physician Dr. Gordan.  FINAL CLINICAL IMPRESSION(S) / ED DIAGNOSES   Final diagnoses:  Contusion of abdominal wall, initial encounter  Near syncope     Rx / DC Orders   ED Discharge Orders     None        Note:  This document was prepared  using Dragon voice recognition software and may include unintentional dictation errors.    Cassis Waylon, MD 01/04/24 2308

## 2024-01-04 NOTE — ED Triage Notes (Signed)
 PT BIB [facility] from home after syncopal episode in the shower. Pt states that she was washing in the shower saw colors and woke up on the ground. Pt states that she hit her RLQ on the step into the shower. Pt has tenderness to RLQ. Pt Aox4 upon arrival EMS BGL 163

## 2024-01-04 NOTE — ED Provider Notes (Signed)
-----------------------------------------   11:10 PM on 01/04/2024 -----------------------------------------  Assuming care from Dr. Jacolyn.  In short, Terri Cook is a 23 y.o. female with a chief complaint of near syncope and fall w/ RLQ pain after contusion.  Refer to the original H&P for additional details.  The current plan of care is to reassess after labs and fluids.   Clinical Course as of 01/05/24 0126  Thu Jan 05, 2024  0125 Patient hCG is negative and troponin is negative.  Urine is unremarkable, some mild hematuria of unclear etiology but no evidence of infection.  I am sending a urine culture to verify but this does not appear consistent with a UTI.  I reassessed the patient and she seems comfortable, having a FaceTime conversation with her fianc and a friend.  She says she is still having some pain where she fell, but her abdomen is soft with no guarding and no clinically significant tenderness to palpation, consistent only with a contusion rather than internal injury.  Patient is comfortable with the plan for discharge and outpatient follow-up, ibuprofen and Tylenol  as needed for pain control.  The patient's medical screening exam is reassuring with no indication of an emergent medical condition requiring hospitalization or additional evaluation at this point.  The patient is safe and appropriate for discharge and outpatient follow up. [CF]    Clinical Course User Index [CF] Gordan Huxley, MD     Medications  sodium chloride  0.9 % bolus 1,000 mL (0 mLs Intravenous Stopped 01/04/24 2355)     ED Discharge Orders     None      Final diagnoses:  Contusion of abdominal wall, initial encounter  Near syncope     Gordan Huxley, MD 01/05/24 0127

## 2024-01-05 DIAGNOSIS — S3011XA Contusion of abdominal wall, initial encounter: Secondary | ICD-10-CM | POA: Diagnosis not present

## 2024-01-05 LAB — URINALYSIS, ROUTINE W REFLEX MICROSCOPIC
Bilirubin Urine: NEGATIVE
Glucose, UA: NEGATIVE mg/dL
Ketones, ur: NEGATIVE mg/dL
Leukocytes,Ua: NEGATIVE
Nitrite: NEGATIVE
Protein, ur: 30 mg/dL — AB
RBC / HPF: >50 RBC/hpf (ref 0–5)
Specific Gravity, Urine: 1.023 (ref 1.005–1.030)
pH: 8 (ref 5.0–8.0)

## 2024-01-05 LAB — HCG, QUANTITATIVE, PREGNANCY: hCG, Beta Chain, Quant, S: <1 m[IU]/mL (ref <5)

## 2024-01-05 LAB — TROPONIN I (HIGH SENSITIVITY): Troponin I (High Sensitivity): <2 ng/L (ref <18)

## 2024-01-05 NOTE — Discharge Instructions (Signed)
 Your workup in the Emergency Department today was reassuring.  We did not find any specific abnormalities.  We recommend you drink plenty of fluids, take your regular medications and/or any new ones prescribed today, and follow up with the doctor(s) listed in these documents as recommended.  Return to the Emergency Department if you develop new or worsening symptoms that concern you.

## 2024-01-12 ENCOUNTER — Ambulatory Visit: Admitting: Family Medicine

## 2024-01-12 ENCOUNTER — Encounter: Payer: Self-pay | Admitting: Family Medicine

## 2024-01-12 VITALS — BP 138/96 | HR 106 | Temp 98.4°F | Ht 68.0 in | Wt 356.6 lb

## 2024-01-12 DIAGNOSIS — S3011XD Contusion of abdominal wall, subsequent encounter: Secondary | ICD-10-CM

## 2024-01-12 DIAGNOSIS — R55 Syncope and collapse: Secondary | ICD-10-CM | POA: Diagnosis not present

## 2024-01-12 DIAGNOSIS — R339 Retention of urine, unspecified: Secondary | ICD-10-CM | POA: Diagnosis not present

## 2024-01-12 DIAGNOSIS — R310 Gross hematuria: Secondary | ICD-10-CM | POA: Diagnosis not present

## 2024-01-12 DIAGNOSIS — N926 Irregular menstruation, unspecified: Secondary | ICD-10-CM

## 2024-01-12 DIAGNOSIS — R7309 Other abnormal glucose: Secondary | ICD-10-CM

## 2024-01-12 LAB — POC URINALSYSI DIPSTICK (AUTOMATED)
Bilirubin, UA: NEGATIVE
Blood, UA: POSITIVE
Glucose, UA: NEGATIVE
Ketones, UA: POSITIVE
Nitrite, UA: NEGATIVE
Protein, UA: POSITIVE — AB
Spec Grav, UA: >=1.03 — AB (ref 1.010–1.025)
Urobilinogen, UA: 1 U/dL
pH, UA: 5 (ref 5.0–8.0)

## 2024-01-12 LAB — POCT GLYCOSYLATED HEMOGLOBIN (HGB A1C): Hemoglobin A1C: 5.6 % (ref 4.0–5.6)

## 2024-01-12 NOTE — Progress Notes (Signed)
 Established Patient Office Visit   Subjective  Patient ID: Terri Cook, female    DOB: November 10, 2000  Age: 23 y.o. MRN: [id number]  Chief Complaint  Patient presents with   Hospitalization Follow-up    Seen in the ED for a fall on 10/1, Near syncopal episode, with right lower quad  abdominal pain,rate of pain 5 out of 10,  patient is also having urine retention,    Pt accompanied by her fiance.  Pt is a 23 year old female seen for ED follow-up.  Patient seen in ED on 01/04/2024 for presyncopal episode with fall.  Patient fell in shower while leaning over hitting lower abd against tub.  No LOC. ED work up negative.  Pt still having soreness in R lower abd. Rates the discomfort 5/10.  Initially had ecchymosis which has since resolved.  Pt inquires if bs is high.  Pt has an appt with OB/Gyn on Monday.  States menses started in the middle of Sept, but hasn't stopped.  Changing pad q 1 hr x 2-3 wks.  Bleeding seemed to decrease then picked back up.    Pt mentions not urinating that much.  Mostly drinking flavored skittles water.  Denies back pain, suprapubic pain, n/v.     Patient Active Problem List   Diagnosis Date Noted   Chronic diarrhea 05/10/2023   Rectal bleeding 05/10/2023   Mild persistent asthma with acute exacerbation 09/17/2022   Snoring 09/17/2022   Iron  deficiency anemia 10/23/2021   Other fatigue 09/30/2021   SOB (shortness of breath) on exertion 09/30/2021   Gastroesophageal reflux disease 09/30/2021   Iron  deficiency 09/30/2021   Depression screening 09/30/2021   HSV-2 infection 01/09/2021   PCOS (polycystic ovarian syndrome)    Dysmetabolic syndrome 07/17/2017   BMI 50.0-59.9, adult (HCC) 07/17/2017   Melanocytic nevus 07/17/2017   Hypovitaminosis D 09/04/2015   Hypertriglyceridemia 04/10/2015   Acanthosis 04/10/2015   Insulin  resistance 04/10/2015   Menorrhagia 04/10/2015   Personal history presenting hazards to health 08/03/2006   Past Medical History:   Diagnosis Date   Abnormal uterine bleeding    Acid reflux    Anemia    Asthma    Fatty liver    High blood pressure    High cholesterol    Ingrown left big toenail 09/2016   recurrent   PCOS (polycystic ovarian syndrome)    Pre-diabetes    Seasonal allergies    Past Surgical History:  Procedure Laterality Date   BIOPSY  05/10/2023   Procedure: BIOPSY;  Surgeon: Legrand Victory LITTIE DOUGLAS, MD;  Location: [facility];  Service: Gastroenterology;;   COLONOSCOPY WITH PROPOFOL  N/A 05/10/2023   Procedure: COLONOSCOPY WITH PROPOFOL ;  Surgeon: Legrand Victory LITTIE DOUGLAS, MD;  Location: [facility];  Service: Gastroenterology;  Laterality: N/A;   GALLBLADDER SURGERY  2021   LESION EXCISION Left 07/11/2014   Procedure: EXCISION OF BENIGN LESIONS INCLUDING MARGINS ;  Surgeon: Julietta Millman, MD;  Location: [facility];  Service: Pediatrics;  Laterality: Left;  mid back  and right neck   TOENAIL EXCISION  01/27/2012   Procedure: MINOR TOENAIL EXCISION;  Surgeon: CHRISTELLA. Julietta Millman, MD;  Location: [facility];  Service: Pediatrics;  Laterality: Left;   TOENAIL EXCISION  03/23/2012   Procedure: MINOR TOENAIL EXCISION;  Surgeon: CHRISTELLA. Julietta Millman, MD;  Location: [facility];  Service: Pediatrics;  Laterality: Right;   TOENAIL EXCISION Right 09/21/2012   Procedure: Partial Excision of Right Toenail;  Surgeon: CHRISTELLA. Julietta Millman,  MD;  Location: [facility];  Service: Pediatrics;  Laterality: Right;   TOENAIL EXCISION Left 02/15/2013   Procedure: PARTIAL TOENAIL EXCISION ON THE LEFT BIG TOE AT THE OUTER LATERAL FOLD WITH PHENOL (MP ROOM) ;  Surgeon: CHRISTELLA. Julietta Millman, MD;  Location: [facility];  Service: Pediatrics;  Laterality: Left;   TOENAIL EXCISION Left 08/16/2013   Procedure: PARTIAL EXCISION OF LEFT BIG TOE NAIL FROM OUTER LATERAL FOLD;  Surgeon: CHRISTELLA. Julietta Millman, MD;  Location: [facility];  Service: Pediatrics;  Laterality:  Left;   TOENAIL EXCISION Left 09/16/2016   Procedure: PARTIAL PERMANENT EXCISION OF LEFT INGROWN BIG TOENAIL;  Surgeon: Millman Julietta, MD;  Location: [facility];  Service: General;  Laterality: Left;   WISDOM TOOTH EXTRACTION  12/08/2018   all 4   Social History   Tobacco Use   Smoking status: Former    Current packs/day: 0.25    Average packs/day: 0.3 packs/day for 2.0 years (0.5 ttl pk-yrs)    Types: Cigarettes   Smokeless tobacco: Never   Tobacco comments:    1-2 a day  Vaping Use   Vaping status: Every Day   Substances: Nicotine, Flavoring  Substance Use Topics   Alcohol  use: No   Drug use: No   Family History  Problem Relation Age of Onset   Asthma Mother    Obesity Mother    Diabetes Father    Hypertension Father    Asthma Father    High Cholesterol Father    Obesity Father    Cervical cancer Maternal Grandmother    Cirrhosis Paternal Grandmother        caused by medication   Diabetes Paternal Grandmother    Colon cancer Neg Hx    Rectal cancer Neg Hx    Stomach cancer Neg Hx    Allergies  Allergen Reactions   Clindamycin/Lincomycin Anaphylaxis    Trouble breathing?    ROS Negative unless stated above    Objective:     BP (!) 138/96 (BP Location: Left Arm, Patient Position: Sitting, Cuff Size: Large)   Pulse (!) 106   Temp 98.4 F (36.9 C) (Oral)   Ht 5' 8 (1.727 m)   Wt (!) 356 lb 9.6 oz (161.8 kg)   LMP 12/30/2023 (Approximate)   SpO2 98%   BMI 54.22 kg/m  BP Readings from Last 3 Encounters:  01/12/24 (!) 138/96  01/05/24 113/67  12/28/23 125/80   Wt Readings from Last 3 Encounters:  01/12/24 (!) 356 lb 9.6 oz (161.8 kg)  01/04/24 (!) 365 lb (165.6 kg)  12/28/23 (!) 356 lb (161.5 kg)      Physical Exam Constitutional:      General: She is not in acute distress.    Appearance: Normal appearance.  HENT:     Head: Normocephalic and atraumatic.     Nose: Nose normal.     Mouth/Throat:     Mouth: Mucous membranes  are moist.  Cardiovascular:     Rate and Rhythm: Normal rate and regular rhythm.     Heart sounds: Normal heart sounds. No murmur heard.    No gallop.  Pulmonary:     Effort: Pulmonary effort is normal. No respiratory distress.     Breath sounds: Normal breath sounds. No wheezing, rhonchi or rales.  Abdominal:     Tenderness: There is abdominal tenderness in the right lower quadrant.  Skin:    General: Skin is warm and dry.  Neurological:  Mental Status: She is alert and oriented to person, place, and time.        10/19/2023    9:59 AM 06/06/2023    8:54 AM 12/10/2022    3:57 PM  Depression screen PHQ 2/9  Decreased Interest 0 0 0  Down, Depressed, Hopeless 0 0 0  PHQ - 2 Score 0 0 0  Altered sleeping 0  0  Tired, decreased energy 0  0  Change in appetite 0  0  Feeling bad or failure about yourself  0  0  Trouble concentrating 0  0  Moving slowly or fidgety/restless 0  0  Suicidal thoughts 0  0  PHQ-9 Score 0  0  Difficult doing work/chores   Not difficult at all      10/19/2023    9:56 AM 12/10/2022    3:57 PM  GAD 7 : Generalized Anxiety Score  Nervous, Anxious, on Edge 0 0  Control/stop worrying 0 0  Worry too much - different things 1 0  Trouble relaxing 0 0  Restless 1 0  Easily annoyed or irritable 0 0  Afraid - awful might happen 1 0  Total GAD 7 Score 3 0  Anxiety Difficulty  Not difficult at all     Results for orders placed or performed in visit on 01/12/24  POCT Urinalysis Dipstick (Automated)  Result Value Ref Range   Color, UA red    Clarity, UA clear    Glucose, UA Negative Negative   Bilirubin, UA neg    Ketones, UA pos    Spec Grav, UA >=1.030 (A) 1.010 - 1.025   Blood, UA pos    pH, UA 5.0 5.0 - 8.0   Protein, UA Positive (A) Negative   Urobilinogen, UA 1.0 0.2 or 1.0 E.U./dL   Nitrite, UA neg    Leukocytes, UA Small (1+) (A) Negative      Assessment & Plan:   Urine retention -     POCT Urinalysis Dipstick (Automated) -     Urine  Culture; Future  Near syncope -     POCT glycosylated hemoglobin (Hb A1C)  Gross hematuria  Irregular bleeding  Contusion of abdominal wall, subsequent encounter  Elevated glucose -     POCT glycosylated hemoglobin (Hb A1C)  Urinary retention.  POC UA with SG 1.030, 1+leuks, RBCs.  Obtain UCx. Advised to increase intake of plain water and have less flavored, sugary drinks.  Dehydration and positional change likely caused near syncopal episode.    RBCs in UA likely from menses.  Hgb in ED 14.9 which is reassuring and UhCG negative in ED.  Question of how heavy menses is/has been.  Encouraged to keep f/u with OB/Gyn.  Supportive care for contusion including ice, heat, and time.  Pt was concerned about A1C, POC testing 5.6% this visit.  Return if symptoms worsen or fail to improve.   Clotilda JONELLE Single, MD

## 2024-01-13 LAB — URINE CULTURE
MICRO NUMBER:: 17079245
SPECIMEN QUALITY:: ADEQUATE

## 2024-01-16 ENCOUNTER — Ambulatory Visit: Payer: Self-pay | Admitting: Family Medicine

## 2024-01-16 ENCOUNTER — Ambulatory Visit (INDEPENDENT_AMBULATORY_CARE_PROVIDER_SITE_OTHER): Admitting: Obstetrics & Gynecology

## 2024-01-16 ENCOUNTER — Encounter (HOSPITAL_BASED_OUTPATIENT_CLINIC_OR_DEPARTMENT_OTHER): Payer: Self-pay | Admitting: Obstetrics & Gynecology

## 2024-01-16 VITALS — BP 138/98 | HR 75 | Wt 356.0 lb

## 2024-01-16 DIAGNOSIS — N926 Irregular menstruation, unspecified: Secondary | ICD-10-CM

## 2024-01-16 DIAGNOSIS — Z6841 Body Mass Index (BMI) 40.0 and over, adult: Secondary | ICD-10-CM

## 2024-01-16 NOTE — Progress Notes (Unsigned)
 GYNECOLOGY  VISIT  CC:   irregular bleeding  HPI: 23 y.o. G50P0010 Single White or Caucasian female here for heavy bleeding. Patient reports heavy bleeding for last two - three weeks with light cramping. She was seen on 9/24 for possible pregnancy after stopping her pills and then having a late cycle.  She had just started bleeding the day of that visit.  HCG level was negative.  She states she restarted her pill is on week three but this doesn't quite correspond to the timing with the last visit.  She reported she did not have her pill pack with her today so not completely sure where she is in the pack of pills.  Pt reports changing pad every 30 minutes yesterday and that flow is heavy.  States bleeding is similar today.  Had pad on for at least an hour today and there is a small amount of blood present on the pad.   Upon additional questioning, she reports bleeding is only heavier in the afternoon and not the morning.  No SOB or palpitations.  Also, she reports falling in the shower a couple of weeks ago and went to ER. ER noted from 10/1 reviewed.    Pt also request considering placement of Nexplanon to get bleeding stopped.  Advised this will not stop her bleeding.  Discussed use for contraception and not for bleeding.  Declines placement today.   Past Medical History:  Diagnosis Date   Abnormal uterine bleeding    Acid reflux    Anemia    Asthma    Fatty liver    High blood pressure    High cholesterol    Ingrown left big toenail 09/2016   recurrent   PCOS (polycystic ovarian syndrome)    Pre-diabetes    Seasonal allergies     MEDS:   Current Outpatient Medications on File Prior to Visit  Medication Sig Dispense Refill   Acetaminophen -Caff-Pyrilamine (MIDOL MAX ST MENSTRUAL PO) Take 2 tablets by mouth 2 (two) times daily as needed (cramps).     ADVAIR  HFA 230-21 MCG/ACT inhaler Inhale 2 puffs into the lungs 2 (two) times daily. 12 g 5   albuterol  (VENTOLIN  HFA) 108 (90 Base)  MCG/ACT inhaler Inhale 2 puffs into the lungs every 6 (six) hours as needed for wheezing or shortness of breath. 8 g 6   Ascorbic Acid (VITA-C PO) Take by mouth.     aspirin-acetaminophen -caffeine (EXCEDRIN EXTRA STRENGTH) 250-250-65 MG tablet Take 2 tablets by mouth every 8 (eight) hours as needed for headache or migraine.     BALZIVA  0.4-35 MG-MCG tablet 1 tablet daily, takes continuous active pills. 112 tablet 3   cetirizine (ZYRTEC) 10 MG tablet Take 10 mg by mouth daily as needed for allergies.     cholecalciferol (VITAMIN D3) 25 MCG (1000 UNIT) tablet Take 1,000 Units by mouth daily.     colestipol  (COLESTID ) 1 g tablet Take 1 tablet (1 g total) by mouth 2 (two) times daily. Take 1 hour before or 4 hours after other medications. 60 tablet 5   FeFum-FePoly-FA-B Cmp-C-Biot (INTEGRA PLUS ) CAPS Take 1 capsule by mouth every morning. 30 capsule 2   ibuprofen (ADVIL,MOTRIN) 200 MG tablet Take 400 mg by mouth every 6 (six) hours as needed for mild pain (pain score 1-3) or headache.     metroNIDAZOLE  (FLAGYL ) 500 MG tablet Take 1 tablet (500 mg total) by mouth 2 (two) times daily. 14 tablet 0   valACYclovir  (VALTREX ) 500 MG tablet Take one  tablet po BID x 3 days with an outbreak 30 tablet 2   zinc  gluconate 50 MG tablet Take 50 mg by mouth daily.     [DISCONTINUED] pantoprazole (PROTONIX) 40 MG tablet Take 40 mg by mouth daily.     No current facility-administered medications on file prior to visit.    ALLERGIES: Clindamycin/lincomycin  SH:  engaged, non smoker  Review of Systems  Constitutional: Negative.   Genitourinary:        Irregular bleeding    PHYSICAL EXAMINATION:    BP (!) 138/98 (BP Location: Right Arm, Patient Position: Sitting, Cuff Size: Large)   Pulse 75   Wt (!) 356 lb (161.5 kg)   LMP 12/30/2023 (Approximate)   SpO2 99%   BMI 54.13 kg/m     General appearance: alert, cooperative and appears stated age CV:  Regular rate and rhythm Lungs:  clear to auscultation, no  wheezes, rales or rhonchi, symmetric air entry  Pelvic: External genitalia:  no lesions              Urethra:  normal appearing urethra with no masses, tenderness or lesions              Bartholins and Skenes: normal                 Vagina: normal mucosa without prolapse or lesions              Cervix: no lesions and blood at os              Bimanual Exam:  Uterus:  normal size, contour, position, consistency, mobility, non-tender              Adnexa: no mass, fullness, tenderness             Chaperone was present for exam.  Assessment/Plan: 1. Irregular bleeding (Primary) - advised pt to stay on pill and send picture for me so I know where she is on her pack of pills.  Before leaving, she found it in her bag and is on day 8.  Will add 10mg  norethindrone  and if bleeding not stopped in 2 days, will increase this dosage.  #60/0RF to pharmacy.    2. BMI 50.0-59.9, adult (HCC)

## 2024-01-17 ENCOUNTER — Telehealth (HOSPITAL_BASED_OUTPATIENT_CLINIC_OR_DEPARTMENT_OTHER): Payer: Self-pay

## 2024-01-17 MED ORDER — NORETHINDRONE ACETATE 5 MG PO TABS: 10.0000 mg | ORAL_TABLET | Freq: Every day | ORAL | 0 refills | Status: DC

## 2024-01-17 NOTE — Telephone Encounter (Signed)
 Tried calling patient without success. LMOM at 2:59 for patient to call office. tbw

## 2024-01-17 NOTE — Telephone Encounter (Signed)
 Terri Cook would like for someone to call her, she has questions about how to take her medication.

## 2024-01-17 NOTE — Telephone Encounter (Signed)
 Patient called back wanting to know if she is suppose to continue with her birth control. According to the chart note she is to continue with her pill and Dr. Cleotilde is going to add the Norethindrone . Patient expressed understanding. tbw

## 2024-01-19 ENCOUNTER — Telehealth (HOSPITAL_BASED_OUTPATIENT_CLINIC_OR_DEPARTMENT_OTHER): Payer: Self-pay

## 2024-01-19 NOTE — Telephone Encounter (Signed)
 Called and spoke with patient. She states that her bleeding has stopped since adding the Norethindrone . She does state that she continues to have bleeding during/after intercourse and would like to know if she is to continue taking medications as prescribed. RN told her to continue to take medications as I do believe that she was supposed to continue them but would discuss with Dr. Cleotilde regarding vaginal bleeding during and after intercourse as well as medication plan and call her back.    Morna LOISE Quale, RN

## 2024-01-19 NOTE — Telephone Encounter (Signed)
 Spoke with patient and advised of Dr. Dianne instructions. Patient verbalized understanding with no further questions or concerns at this time.   Morna LOISE Quale, RN

## 2024-01-19 NOTE — Telephone Encounter (Signed)
 Patient would like for a nurse to call her concerning for medications.

## 2024-01-31 ENCOUNTER — Telehealth (HOSPITAL_BASED_OUTPATIENT_CLINIC_OR_DEPARTMENT_OTHER): Payer: Self-pay

## 2024-01-31 NOTE — Telephone Encounter (Signed)
 Pt would like a nurse to call her concerning her heavy periods.

## 2024-02-01 ENCOUNTER — Telehealth (HOSPITAL_BASED_OUTPATIENT_CLINIC_OR_DEPARTMENT_OTHER): Payer: Self-pay

## 2024-02-01 NOTE — Telephone Encounter (Signed)
 Patient called and stated she is bleeding very heavy. She is soaking a pad every 30 minutes. She states that she was told to take Norethindrone  10mg  for 3 weeks and off for the 4th week. Patient wants to know what to do. Please advise.

## 2024-02-01 NOTE — Telephone Encounter (Signed)
 Called patient and asked her to come in 02/02/2024 @3 :55 per Dr. Cleotilde. tbw

## 2024-02-01 NOTE — Telephone Encounter (Signed)
 Telephone message sent to Dr. Cleotilde. tbw

## 2024-02-01 NOTE — Telephone Encounter (Signed)
 Called patient and she has been set up to be seen in the clinic tomorrow. tbw

## 2024-02-02 ENCOUNTER — Ambulatory Visit (HOSPITAL_BASED_OUTPATIENT_CLINIC_OR_DEPARTMENT_OTHER): Admitting: Obstetrics & Gynecology

## 2024-02-02 ENCOUNTER — Encounter (HOSPITAL_BASED_OUTPATIENT_CLINIC_OR_DEPARTMENT_OTHER): Payer: Self-pay | Admitting: Obstetrics & Gynecology

## 2024-02-02 VITALS — BP 136/82 | HR 92 | Wt 355.2 lb

## 2024-02-02 DIAGNOSIS — Z6841 Body Mass Index (BMI) 40.0 and over, adult: Secondary | ICD-10-CM

## 2024-02-02 DIAGNOSIS — N921 Excessive and frequent menstruation with irregular cycle: Secondary | ICD-10-CM

## 2024-02-02 NOTE — Progress Notes (Signed)
   GYNECOLOGY  VISIT  CC:   heavy bleeding   HPI: 23 y.o. [id number] Single White or Caucasian female here for irregular bleeding. Patient reports that she has been taking birth control pills every day around 11 am. She reports that she started her placebo week on Saturday and started spotting. She reports heavy bleeding since Sunday and has been soaking through one pad every 45 - 1 hour.  It is not full when she changes.  She changes for hygiene.  No SOB or palpitations.  No trouble with ambulation.  She is passing some small clots.    Discussed with pt I understood from her communication that she had only been bleeding heavily for about 24 hours so will check hb to check for anemia.  She is not taking any iron  at this time.    Patient's last menstrual period was 01/28/2024 (approximate).  Past Medical History:  Diagnosis Date   Abnormal uterine bleeding    Acid reflux    Anemia    Asthma    Fatty liver    High blood pressure    High cholesterol    Ingrown left big toenail 09/2016   recurrent   PCOS (polycystic ovarian syndrome)    Pre-diabetes    Seasonal allergies     MEDS:  Reviewed in EPIC  ALLERGIES: Clindamycin/lincomycin  SH:  single, non smoker  Review of Systems  Constitutional: Negative.   Genitourinary:        Menorrhagia    PHYSICAL EXAMINATION:    BP 136/82 (BP Location: Left Arm, Patient Position: Sitting, Cuff Size: Large)   Pulse 92   Wt (!) 355 lb 3.2 oz (161.1 kg)   LMP 01/28/2024 (Approximate)   SpO2 99%   BMI 54.01 kg/m     General appearance: alert, cooperative and appears stated age CV:  Regular rate and rhythm Lungs:  clear to auscultation, no wheezes, rales or rhonchi, symmetric air entry  Pelvic: External genitalia:  no lesions              Urethra:  normal appearing urethra with no masses, tenderness or lesions              Bartholins and Skenes: normal                 Vagina: normal mucosa without prolapse or lesions, dark blood in  os with small               Cervix: no lesions              Bimanual Exam:  Uterus:  normal size, contour, position, consistency, mobility, non-tender              Adnexa: no mass noted  Chaperone was present for exam.  Assessment/Plan: 1. Menorrhagia with irregular cycle (Primary) - advised pt to restart her OCP today with 10mg  norethindrone  considering the amount of bleeding she's had this week.   - CBC.  Depending on level, may need her to start oral iron .  Will await results.   - US  PELVIC COMPLETE WITH TRANSVAGINAL; Future - Beta hCG quant (ref lab)  2. BMI 50.0-59.9, adult (HCC)

## 2024-02-03 ENCOUNTER — Ambulatory Visit (HOSPITAL_BASED_OUTPATIENT_CLINIC_OR_DEPARTMENT_OTHER): Payer: Self-pay | Admitting: Obstetrics & Gynecology

## 2024-02-03 LAB — CBC
Hematocrit: 43.8 % (ref 34.0–46.6)
Hemoglobin: 14.1 g/dL (ref 11.1–15.9)
MCH: 26.9 pg (ref 26.6–33.0)
MCHC: 32.2 g/dL (ref 31.5–35.7)
MCV: 84 fL (ref 79–97)
Platelets: 435 x10E3/uL (ref 150–450)
RBC: 5.24 x10E6/uL (ref 3.77–5.28)
RDW: 13.2 % (ref 11.7–15.4)
WBC: 9.6 x10E3/uL (ref 3.4–10.8)

## 2024-02-03 LAB — BETA HCG QUANT (REF LAB): hCG Quant: <1 m[IU]/mL

## 2024-02-04 ENCOUNTER — Ambulatory Visit (HOSPITAL_BASED_OUTPATIENT_CLINIC_OR_DEPARTMENT_OTHER)
Admission: RE | Admit: 2024-02-04 | Discharge: 2024-02-04 | Disposition: A | Discharge status: Home / Self Care | Source: Ambulatory Visit | Attending: Obstetrics & Gynecology | Admitting: Obstetrics & Gynecology

## 2024-02-04 DIAGNOSIS — N921 Excessive and frequent menstruation with irregular cycle: Secondary | ICD-10-CM

## 2024-02-08 ENCOUNTER — Ambulatory Visit (HOSPITAL_BASED_OUTPATIENT_CLINIC_OR_DEPARTMENT_OTHER)
Admission: RE | Admit: 2024-02-08 | Discharge: 2024-02-08 | Disposition: A | Discharge status: Home / Self Care | Source: Ambulatory Visit | Attending: Obstetrics & Gynecology | Admitting: Obstetrics & Gynecology

## 2024-02-08 DIAGNOSIS — N921 Excessive and frequent menstruation with irregular cycle: Secondary | ICD-10-CM | POA: Insufficient documentation

## 2024-02-10 ENCOUNTER — Other Ambulatory Visit (HOSPITAL_BASED_OUTPATIENT_CLINIC_OR_DEPARTMENT_OTHER): Payer: Self-pay | Admitting: Obstetrics & Gynecology

## 2024-02-15 ENCOUNTER — Other Ambulatory Visit (HOSPITAL_BASED_OUTPATIENT_CLINIC_OR_DEPARTMENT_OTHER): Payer: Self-pay

## 2024-02-15 MED ORDER — METFORMIN HCL ER 500 MG PO TB24: 500.0000 mg | ORAL_TABLET | Freq: Every day | ORAL | 0 refills | Status: DC

## 2024-02-15 NOTE — Progress Notes (Signed)
 Patient to start Metformin 500mg  with breakfast for 30 days and then start Metformin 500mg  twice daily with meals per Cleotilde. Patient will call in 1 week before Rx runs out to request new Rx. tbw

## 2024-03-05 ENCOUNTER — Ambulatory Visit: Payer: Self-pay

## 2024-03-05 NOTE — Telephone Encounter (Signed)
 FYI Only or Action Required?: FYI only for provider: appointment scheduled on 03/07/24.  Patient was last seen in primary care on 01/12/2024 by Mercer Clotilda SAUNDERS, MD.  Called Nurse Triage reporting Rash.  Symptoms began several weeks ago.  Interventions attempted: OTC medications: Hydrocortisone , Eczema Lotion , Rest, hydration, or home remedies, and Ice/heat application.  Symptoms are: gradually worsening.  Triage Disposition: See PCP When Office is Open (Within 3 Days)  Patient/caregiver understands and will follow disposition?: Yes   Copied from CRM #[id number]. Topic: Clinical - Red Word Triage >> Mar 05, 2024  8:03 AM Amy B wrote: Red Word that prompted transfer to Nurse Triage: Rash under breasts, spreading to torso, pain with itching Reason for Disposition  Localized rash present > 7 days  Answer Assessment - Initial Assessment Questions 1. APPEARANCE of RASH: What does the rash look like? (e.g., blisters, dry flaky skin, red spots, redness, sores)     Red, looks like bug bites but not a bug bite 2. LOCATION: Where is the rash located?      Under and in between both breasts, spreading to abdomen 3. NUMBER: How many spots are there?      Too many to count 4. SIZE: How big are the spots? (e.g., inches, cm; or compare to size of pinhead, tip of pen, eraser, pea)      Tip of a pen  5. ONSET: When did the rash start?      X 2-3 weeks 6. ITCHING: Does the rash itch? If Yes, ask: How bad is the itch?  (Scale 0-10; or none, mild, moderate, severe)     Moderate-Severe 7. PAIN: Does the rash hurt? If Yes, ask: How bad is the pain?  (Scale 0-10; or none, mild, moderate, severe)     Only painful is scratching too much 8. OTHER SYMPTOMS: Do you have any other symptoms? (e.g., fever)     One period of oozing 9. PREGNANCY: Is there any chance you are pregnant? When was your last menstrual period?     Unknown, pt notes she has been trying  Protocols used: Rash  or Redness - Localized-A-AH

## 2024-03-05 NOTE — Telephone Encounter (Signed)
Patient has appt 12/3

## 2024-03-07 ENCOUNTER — Other Ambulatory Visit (HOSPITAL_COMMUNITY)
Admission: RE | Admit: 2024-03-07 | Discharge: 2024-03-07 | Disposition: A | Source: Ambulatory Visit | Attending: Family Medicine | Admitting: Family Medicine

## 2024-03-07 ENCOUNTER — Ambulatory Visit: Admitting: Family Medicine

## 2024-03-07 ENCOUNTER — Encounter: Payer: Self-pay | Admitting: Family Medicine

## 2024-03-07 VITALS — BP 130/72 | HR 95 | Temp 98.5°F | Wt 352.2 lb

## 2024-03-07 DIAGNOSIS — J020 Streptococcal pharyngitis: Secondary | ICD-10-CM

## 2024-03-07 DIAGNOSIS — N898 Other specified noninflammatory disorders of vagina: Secondary | ICD-10-CM | POA: Insufficient documentation

## 2024-03-07 DIAGNOSIS — J029 Acute pharyngitis, unspecified: Secondary | ICD-10-CM | POA: Diagnosis not present

## 2024-03-07 DIAGNOSIS — L304 Erythema intertrigo: Secondary | ICD-10-CM | POA: Diagnosis not present

## 2024-03-07 LAB — POCT RAPID STREP A (OFFICE): Rapid Strep A Screen: POSITIVE — AB

## 2024-03-07 MED ORDER — AMOXICILLIN 500 MG PO TABS: 500.0000 mg | ORAL_TABLET | Freq: Two times a day (BID) | ORAL | 0 refills | Status: AC

## 2024-03-07 MED ORDER — NYSTATIN 100000 UNIT/GM EX POWD: 1.0000 | Freq: Three times a day (TID) | CUTANEOUS | 0 refills | Status: AC

## 2024-03-07 NOTE — Progress Notes (Signed)
 Established Patient Office Visit   Subjective  Patient ID: Terri Cook, female    DOB: 2001/03/29  Age: 23 y.o. MRN: [id number]  Chief Complaint  Patient presents with   Acute Visit    Patient came in because she has a rash under her breast, started 2 weeks ago and vaginal odor started 1 week ago.  Patient also has a sore throat that began on tuesday and wants a strep test.  Patient has tried cold compresses, corn starch, hydrocortizone cream    Pt is a 23 year old female seen for acute concerns.  Patient endorses pruritic rash underneath breast x 3-weeks.  Tried cortisone cream, cornstarch, cold compresses without relief.  Patient endorses increased sweating underneath breasts.    Pt with vaginal odor, noticed by her fiance.  Endorses increased sexual activity as she and her fiance are hoping to conceive.  Pt mentions she is on pills to help with AUB that she takes for 3 wks, then switches to the next pack without taking the last wk of pills.  Denies d/c, irritation, changes in soaps, lotions, or detergents.  Pt feels like she may be getting sick.  Endorses sore throat x 2 days with nasal congestion, emesis, and decreased energy.  Called out of work today due to symptoms.    Patient Active Problem List   Diagnosis Date Noted   Chronic diarrhea 05/10/2023   Rectal bleeding 05/10/2023   Mild persistent asthma with acute exacerbation 09/17/2022   Snoring 09/17/2022   Iron  deficiency anemia 10/23/2021   Other fatigue 09/30/2021   SOB (shortness of breath) on exertion 09/30/2021   Gastroesophageal reflux disease 09/30/2021   Iron  deficiency 09/30/2021   Depression screening 09/30/2021   HSV-2 infection 01/09/2021   PCOS (polycystic ovarian syndrome)    Dysmetabolic syndrome 07/17/2017   BMI 50.0-59.9, adult (HCC) 07/17/2017   Melanocytic nevus 07/17/2017   Hypovitaminosis D 09/04/2015   Hypertriglyceridemia 04/10/2015   Acanthosis 04/10/2015   Insulin  resistance 04/10/2015    Menorrhagia 04/10/2015   Personal history presenting hazards to health 08/03/2006   Past Medical History:  Diagnosis Date   Abnormal uterine bleeding    Acid reflux    Anemia    Asthma    Fatty liver    High blood pressure    High cholesterol    Ingrown left big toenail 09/2016   recurrent   PCOS (polycystic ovarian syndrome)    Pre-diabetes    Seasonal allergies    Past Surgical History:  Procedure Laterality Date   BIOPSY  05/10/2023   Procedure: BIOPSY;  Surgeon: Legrand Victory LITTIE DOUGLAS, MD;  Location: [facility];  Service: Gastroenterology;;   COLONOSCOPY WITH PROPOFOL  N/A 05/10/2023   Procedure: COLONOSCOPY WITH PROPOFOL ;  Surgeon: Legrand Victory LITTIE DOUGLAS, MD;  Location: [facility];  Service: Gastroenterology;  Laterality: N/A;   GALLBLADDER SURGERY  2021   LESION EXCISION Left 07/11/2014   Procedure: EXCISION OF BENIGN LESIONS INCLUDING MARGINS ;  Surgeon: Julietta Millman, MD;  Location: [facility];  Service: Pediatrics;  Laterality: Left;  mid back  and right neck   TOENAIL EXCISION  01/27/2012   Procedure: MINOR TOENAIL EXCISION;  Surgeon: CHRISTELLA. Julietta Millman, MD;  Location: [facility];  Service: Pediatrics;  Laterality: Left;   TOENAIL EXCISION  03/23/2012   Procedure: MINOR TOENAIL EXCISION;  Surgeon: CHRISTELLA. Julietta Millman, MD;  Location: [facility];  Service: Pediatrics;  Laterality: Right;   TOENAIL EXCISION Right 09/21/2012   Procedure:  Partial Excision of Right Toenail;  Surgeon: CHRISTELLA. Julietta Millman, MD;  Location: [facility];  Service: Pediatrics;  Laterality: Right;   TOENAIL EXCISION Left 02/15/2013   Procedure: PARTIAL TOENAIL EXCISION ON THE LEFT BIG TOE AT THE OUTER LATERAL FOLD WITH PHENOL (MP ROOM) ;  Surgeon: CHRISTELLA. Julietta Millman, MD;  Location: [facility];  Service: Pediatrics;  Laterality: Left;   TOENAIL EXCISION Left 08/16/2013   Procedure: PARTIAL EXCISION OF LEFT BIG TOE NAIL FROM OUTER LATERAL  FOLD;  Surgeon: CHRISTELLA. Julietta Millman, MD;  Location: [facility];  Service: Pediatrics;  Laterality: Left;   TOENAIL EXCISION Left 09/16/2016   Procedure: PARTIAL PERMANENT EXCISION OF LEFT INGROWN BIG TOENAIL;  Surgeon: Millman Julietta, MD;  Location: [facility];  Service: General;  Laterality: Left;   WISDOM TOOTH EXTRACTION  12/08/2018   all 4   Social History   Tobacco Use   Smoking status: Former    Current packs/day: 0.25    Average packs/day: 0.3 packs/day for 2.0 years (0.5 ttl pk-yrs)    Types: Cigarettes   Smokeless tobacco: Never   Tobacco comments:    1-2 a day  Vaping Use   Vaping status: Every Day   Substances: Nicotine, Flavoring  Substance Use Topics   Alcohol  use: No   Drug use: No   Family History  Problem Relation Age of Onset   Asthma Mother    Obesity Mother    Diabetes Father    Hypertension Father    Asthma Father    High Cholesterol Father    Obesity Father    Cervical cancer Maternal Grandmother    Cirrhosis Paternal Grandmother        caused by medication   Diabetes Paternal Grandmother    Colon cancer Neg Hx    Rectal cancer Neg Hx    Stomach cancer Neg Hx    Allergies  Allergen Reactions   Clindamycin/Lincomycin Anaphylaxis    Trouble breathing?    ROS Negative unless stated above    Objective:     BP 130/72 (BP Location: Left Arm, Patient Position: Sitting, Cuff Size: Large)   Pulse 95   Temp 98.5 F (36.9 C) (Oral)   Wt (!) 352 lb 3.2 oz (159.8 kg)   SpO2 99%   BMI 53.55 kg/m  BP Readings from Last 3 Encounters:  03/07/24 130/72  02/02/24 136/82  01/16/24 (!) 138/98   Wt Readings from Last 3 Encounters:  03/07/24 (!) 352 lb 3.2 oz (159.8 kg)  02/02/24 (!) 355 lb 3.2 oz (161.1 kg)  01/16/24 (!) 356 lb (161.5 kg)      Physical Exam Constitutional:      General: She is not in acute distress.    Appearance: Normal appearance.  HENT:     Head: Normocephalic and atraumatic.     Nose: Nose  normal.     Mouth/Throat:     Mouth: Mucous membranes are moist.     Pharynx: Posterior oropharyngeal erythema present.     Tonsils: Tonsillar exudate present.  Cardiovascular:     Rate and Rhythm: Normal rate and regular rhythm.     Heart sounds: Normal heart sounds. No murmur heard.    No gallop.  Pulmonary:     Effort: Pulmonary effort is normal. No respiratory distress.     Breath sounds: Normal breath sounds. No wheezing, rhonchi or rales.  Genitourinary:    Comments: Aptima self swab collected. Skin:    General:  Skin is warm and dry.     Findings: Rash present.     Comments: Erythema and satellite lesions underneath bilateral breast and midline sternum.  Excoriation noted.  Neurological:     Mental Status: She is alert and oriented to person, place, and time.        03/07/2024    4:39 PM 10/19/2023    9:59 AM 06/06/2023    8:54 AM  Depression screen PHQ 2/9  Decreased Interest 0 0 0  Down, Depressed, Hopeless 0 0 0  PHQ - 2 Score 0 0 0  Altered sleeping 0 0   Tired, decreased energy 0 0   Change in appetite 0 0   Feeling bad or failure about yourself  0 0   Trouble concentrating 0 0   Moving slowly or fidgety/restless 0 0   Suicidal thoughts 0 0   PHQ-9 Score 0 0    Difficult doing work/chores Not difficult at all       Data saved with a previous flowsheet row definition      03/07/2024    4:39 PM 10/19/2023    9:56 AM 12/10/2022    3:57 PM  GAD 7 : Generalized Anxiety Score  Nervous, Anxious, on Edge 0 0 0  Control/stop worrying 0 0 0  Worry too much - different things 0 1 0  Trouble relaxing 0 0 0  Restless 0 1 0  Easily annoyed or irritable 0 0 0  Afraid - awful might happen 0 1 0  Total GAD 7 Score 0 3 0  Anxiety Difficulty Not difficult at all  Not difficult at all     Results for orders placed or performed in visit on 03/07/24  POCT rapid strep A  Result Value Ref Range   Rapid Strep A Screen Positive (A) Negative      Assessment & Plan:    Strep pharyngitis -     Amoxicillin ; Take 1 tablet (500 mg total) by mouth 2 (two) times daily for 10 days.  Dispense: 20 tablet; Refill: 0  Sore throat -     POCT rapid strep A  Intertrigo -     Nystatin ; Apply 1 Application topically 3 (three) times daily.  Dispense: 15 g; Refill: 0  Vaginal odor -     Cervicovaginal ancillary only  Acute URI symptoms.  POC strep test positive.  Start amoxicillin .  Given precautions.  Start nystatin  powder for intertrigo.  Discussed prevention.  Advised to avoid scratching as may cause secondary infection.  Aptima self swab collected for vaginal odor.  Further recommendations if needed based on results.  Hygiene reviewed.  Advised currently taking OCP Balziva  continuously for AUB which would prevent patient from conceiving while taking med. F/u with OB/Gyn.  Return if symptoms worsen or fail to improve.   Clotilda JONELLE Single, MD

## 2024-03-08 ENCOUNTER — Other Ambulatory Visit (HOSPITAL_BASED_OUTPATIENT_CLINIC_OR_DEPARTMENT_OTHER): Payer: Self-pay | Admitting: Obstetrics & Gynecology

## 2024-03-08 LAB — CERVICOVAGINAL ANCILLARY ONLY
Bacterial Vaginitis (gardnerella): NEGATIVE
Candida Glabrata: NEGATIVE
Candida Vaginitis: NEGATIVE
Chlamydia: NEGATIVE
Comment: NEGATIVE
Comment: NEGATIVE
Comment: NEGATIVE
Comment: NEGATIVE
Comment: NEGATIVE
Comment: NORMAL
Neisseria Gonorrhea: NEGATIVE
Trichomonas: NEGATIVE

## 2024-03-10 ENCOUNTER — Other Ambulatory Visit (HOSPITAL_BASED_OUTPATIENT_CLINIC_OR_DEPARTMENT_OTHER): Payer: Self-pay | Admitting: Obstetrics & Gynecology

## 2024-03-12 ENCOUNTER — Ambulatory Visit: Payer: Self-pay | Admitting: Family Medicine

## 2024-03-12 NOTE — Telephone Encounter (Signed)
 FYI Only or Action Required?: Action required by provider: Request for a different rash medication.  Patient was last seen in primary care on 03/07/2024 by Mercer Clotilda SAUNDERS, MD.  Called Nurse Triage reporting Rash.  Symptoms began several days ago.  Interventions attempted: Prescription medications: Nystatin  powder.  Symptoms are: gradually worsening.  Triage Disposition: Home Care  Patient/caregiver understands and will follow disposition?: Yes Reason for Disposition  Mild localized rash  Answer Assessment - Initial Assessment Questions Was given a nystatin  powder 12/3, states its not working patient would to see if something else can be called in to pharmacy or if PCP needs her back in. Please advise.   1. APPEARANCE of RASH: What does the rash look like? (e.g., blisters, dry flaky skin, red spots, redness, sores)     Red and bumpy, some spots look dry  2. LOCATION: Where is the rash located?      Under breasts going down top of abd.   3. ITCHING: Does the rash itch? If Yes, ask: How bad is the itch?  (Scale 0-10; or none, mild, moderate, severe)     Severe  4. PAIN: Does the rash hurt? If Yes, ask: How bad is the pain?  (Scale 0-10; or none, mild, moderate, severe)     Only hurts when scratching, scratching it raw  Protocols used: Rash or Redness - Localized-A-AH  Copied from CRM #[id number]. Topic: Clinical - Red Word Triage >> Mar 05, 2024  8:03 AM Amy B wrote: Red Word that prompted transfer to Nurse Triage: Rash under breasts, spreading to torso, pain with itching >> Mar 12, 2024  3:04 PM Emylou G wrote: Was given a powder last week for her issue.SABRA it's still spreading and itchy.SABRA

## 2024-03-19 ENCOUNTER — Telehealth (HOSPITAL_BASED_OUTPATIENT_CLINIC_OR_DEPARTMENT_OTHER): Payer: Self-pay

## 2024-03-19 NOTE — Telephone Encounter (Signed)
 Patient would like for a nurse to call her, she has questions about her rx.

## 2024-03-19 NOTE — Telephone Encounter (Signed)
 Spoke with patient, she would like to know if she  should go ahead and have period this week. Advised patient to go ahead and take placebo week for birth control pill and to call office if bleeding is very heavy or painful/unbearable. Patient verbalized understanding with no further questions or concerns.   Morna LOISE Quale, RN

## 2024-03-21 ENCOUNTER — Ambulatory Visit: Payer: Self-pay | Admitting: Family Medicine

## 2024-03-30 ENCOUNTER — Telehealth (HOSPITAL_BASED_OUTPATIENT_CLINIC_OR_DEPARTMENT_OTHER): Payer: Self-pay

## 2024-03-30 ENCOUNTER — Ambulatory Visit: Payer: Self-pay

## 2024-03-30 NOTE — Telephone Encounter (Signed)
 Patient returned call stating she is unable to be seen at her PCP's office today. She would like to schedule an appointment to be seen in our office next week. Appointment scheduled for 12/29 at 2:55 pm with Dr.Miller. Advised patient is symptoms worsen or she develops new symptoms will need to be seen for immediate evaluation at urgent care or ER. Patient verbalizes understanding.

## 2024-03-30 NOTE — Telephone Encounter (Signed)
 FYI Only or Action Required?: FYI only for provider: No available appts today - advised UC. Pt agreeable to go to UC.  Patient was last seen in primary care on 03/07/2024 by Mercer Clotilda SAUNDERS, MD.  Called Nurse Triage reporting Abdominal Pain.  Symptoms began yesterday.  Interventions attempted: OTC medications: midol/ibuprofen.  Symptoms are: unchanged.  Triage Disposition: See Physician Within 24 Hours  Patient/caregiver understands and will follow disposition?: Yes  Reason for Disposition  [1] MODERATE pain (e.g., interferes with normal activities) AND [2] pain comes and goes (cramps) AND [3] present > 24 hours  (Exception: Pain with Vomiting or Diarrhea - see that Guideline.)  Answer Assessment - Initial Assessment Questions Patient called with lower abd pain that comes and goes, feels like a cramp but is sharp, onset yesterday 03/29/24, midol/ibuprofen - doesn't seem to help. Pt was advised by OBGYN to make an appt with PCP - no available appts -  advised UC. Pt willing to go to UC.   Pt period started 03/18/24 and was suppose to end 03/25/24.  1. LOCATION: Where does it hurt?      Lower abdomen 2. RADIATION: Does the pain shoot anywhere else? (e.g., chest, back)     Radiates into lower back 3. ONSET: When did the pain begin? (e.g., minutes, hours or days ago)      03/29/24 4. SUDDEN: Gradual or sudden onset?     Sudden sharp pain 5. PATTERN Does the pain come and go, or is it constant?     Comes and goes 6. SEVERITY: How bad is the pain?  (e.g., Scale 1-10; mild, moderate, or severe)     6/10 7. RECURRENT SYMPTOM: Have you ever had this type of stomach pain before? If Yes, ask: When was the last time? and What happened that time?      First time in a while 8. CAUSE: What do you think is causing the stomach pain? (e.g., gallstones, recent abdominal surgery)     N/a  9. RELIEVING/AGGRAVATING FACTORS: What makes it better or worse? (e.g., antacids,  bending or twisting motion, bowel movement)     Tried midol/ibuprofen - does not help 10. OTHER SYMPTOMS: Do you have any other symptoms? (e.g., back pain, diarrhea, fever, urination pain, vomiting)       Lower back pain, diarrhea. Denies fever or vomiting  11. PREGNANCY: Is there any chance you are pregnant? When was your last menstrual period?       Pt states does not know  Protocols used: Abdominal Pain - [facility]  Copied from CRM #[id number]. Topic: Clinical - Red Word Triage >> Mar 30, 2024  9:31 AM Ivette P wrote: Red Word that prompted transfer to Nurse Triage: Lower abdomen pain - doesnt seem to go away. bleeding wont stop. caleld OBGYN and was told to go to [facility] and attempt to get an appt. no appt availabel at [facility] with patient. Patient states that she began having sharp lower abdominal pain yesterday. Reports pain is currently a 6/10. Reports taking Ibuprofen is not helping. Denies any nausea, vomiting, fever or chills. Patient is taking norethindrone  and balziva  daily and has not missed any pills. Advised patient she will need to be seen for further evaluation today. Advised our providers are off for the holiday today. Recommended that she contact her PCP's office to see If they can see her today. Advised if she can not to contact our office back for assistance with getting an appointment. Patient verbalizes understanding.

## 2024-03-30 NOTE — Telephone Encounter (Signed)
 Spoke with patient. Patient states that she began having sharp lower abdominal pain yesterday. Reports pain is currently a 6/10. Reports taking Ibuprofen is not helping. Denies any nausea, vomiting, fever or chills. Patient is taking norethindrone  and balziva  daily and has not missed any pills. Advised patient she will need to be seen for further evaluation today. Advised our providers are off for the holiday today. Recommended that she contact her PCP's office to see If they can see her today. Advised if she can not to contact our office back for assistance with getting an appointment. Patient verbalizes understanding.

## 2024-04-02 ENCOUNTER — Ambulatory Visit (HOSPITAL_BASED_OUTPATIENT_CLINIC_OR_DEPARTMENT_OTHER): Payer: Self-pay | Admitting: Obstetrics & Gynecology

## 2024-04-02 ENCOUNTER — Encounter (HOSPITAL_BASED_OUTPATIENT_CLINIC_OR_DEPARTMENT_OTHER): Payer: Self-pay | Admitting: Obstetrics & Gynecology

## 2024-04-02 VITALS — BP 130/99 | HR 97 | Wt 347.0 lb

## 2024-04-02 DIAGNOSIS — Z6841 Body Mass Index (BMI) 40.0 and over, adult: Secondary | ICD-10-CM

## 2024-04-02 DIAGNOSIS — R300 Dysuria: Secondary | ICD-10-CM

## 2024-04-02 DIAGNOSIS — N921 Excessive and frequent menstruation with irregular cycle: Secondary | ICD-10-CM

## 2024-04-02 DIAGNOSIS — E88819 Insulin resistance, unspecified: Secondary | ICD-10-CM

## 2024-04-02 LAB — POCT URINALYSIS DIP (CLINITEK)
Glucose, UA: 100 mg/dL — AB
Nitrite, UA: POSITIVE — AB
POC PROTEIN,UA: >=300 — AB
Spec Grav, UA: 1.015
Urobilinogen, UA: >=8 U/dL — AB
pH, UA: 8.5 — AB

## 2024-04-02 LAB — CBC
Hematocrit: 45.8 % (ref 34.0–46.6)
Hemoglobin: 15.2 g/dL (ref 11.1–15.9)
MCH: 26.4 pg — ABNORMAL LOW (ref 26.6–33.0)
MCHC: 33.2 g/dL (ref 31.5–35.7)
MCV: 80 fL (ref 79–97)
Platelets: 426 x10E3/uL (ref 150–450)
RBC: 5.75 x10E6/uL — ABNORMAL HIGH (ref 3.77–5.28)
RDW: 12.6 % (ref 11.7–15.4)
WBC: 8.3 x10E3/uL (ref 3.4–10.8)

## 2024-04-02 LAB — TSH: TSH: 4.1 u[IU]/mL (ref 0.450–4.500)

## 2024-04-02 MED ORDER — SULFAMETHOXAZOLE-TRIMETHOPRIM 800-160 MG PO TABS: 1.0000 | ORAL_TABLET | Freq: Two times a day (BID) | ORAL | 0 refills | Status: DC

## 2024-04-02 MED ORDER — NORETHINDRONE ACETATE 5 MG PO TABS: 10.0000 mg | ORAL_TABLET | Freq: Every day | ORAL | 3 refills | Status: AC

## 2024-04-02 NOTE — Progress Notes (Unsigned)
" ° °  GYNECOLOGY  VISIT  CC:   Follow-up   HPI: 23 y.o. [id number] Single White or Caucasian female here for pelvic pain. Patient reports that she had a period the week of 03/19/2024. The period lasted for about a week and then she started new pill pack on 03/25/2024. She reports that on Christmas she developed severe pain and vaginal bleeding. She reports being very weak, dizzy and unable to eat because bloating has been very painful as well.   LMP: 03/29/2024  Past Medical History:  Diagnosis Date   Abnormal uterine bleeding    Acid reflux    Anemia    Asthma    Fatty liver    High blood pressure    High cholesterol    Ingrown left big toenail 09/2016   recurrent   PCOS (polycystic ovarian syndrome)    Pre-diabetes    Seasonal allergies     MEDS:  Reviewed in EPIC  ALLERGIES: Clindamycin/lincomycin  SH:  ***  ROS  PHYSICAL EXAMINATION:    BP (!) 130/99 (BP Location: Right Arm, Patient Position: Sitting, Cuff Size: Large)   Pulse 97   Wt (!) 347 lb (157.4 kg)   LMP 03/29/2024 (Exact Date)   SpO2 100%   BMI 52.76 kg/m     General appearance: alert, cooperative and appears stated age Neck: no adenopathy, supple, symmetrical, trachea midline and thyroid {CHL AMB PHY EX THYROID NORM DEFAULT:[phone]::normal to inspection and palpation} CV:  {Exam; heart brief:31539} Lungs:  {pe lungs ob:314451} Breasts: {Exam; breast:13139::normal appearance, no masses or tenderness} Abdomen: soft, non-tender; bowel sounds normal; no masses,  no organomegaly Lymph:  no inguinal LAD noted  Pelvic: External genitalia:  no lesions              Urethra:  normal appearing urethra with no masses, tenderness or lesions              Bartholins and Skenes: normal                 Vagina: {exam; pelvic vaginal:30846}              Cervix: {CHL AMB PHY EX CERVIX NORM DEFAULT:[phone]::no lesions}              Bimanual Exam:  Uterus:  {CHL AMB PHY EX UTERUS NORM [phone]::normal  size, contour, position, consistency, mobility, non-tender}              Adnexa: {CHL AMB PHY EX ADNEXA NO MASS DEFAULT:[phone]::no mass, fullness, tenderness}              Rectovaginal: {yes no:[id number]}.  Confirms.              Anus:  normal sphincter tone, no lesions  Chaperone was present for exam.  Assessment/Plan: There are no diagnoses linked to this encounter.  "

## 2024-04-03 ENCOUNTER — Ambulatory Visit (HOSPITAL_BASED_OUTPATIENT_CLINIC_OR_DEPARTMENT_OTHER): Payer: Self-pay | Admitting: Obstetrics & Gynecology

## 2024-04-03 NOTE — Telephone Encounter (Signed)
 Patient seen OBGYN 12/29

## 2024-04-05 ENCOUNTER — Encounter (HOSPITAL_BASED_OUTPATIENT_CLINIC_OR_DEPARTMENT_OTHER): Payer: Self-pay | Admitting: Obstetrics & Gynecology

## 2024-04-05 LAB — URINE CULTURE

## 2024-04-06 ENCOUNTER — Encounter: Payer: Self-pay | Admitting: Family Medicine

## 2024-04-09 ENCOUNTER — Telehealth (HOSPITAL_BASED_OUTPATIENT_CLINIC_OR_DEPARTMENT_OTHER): Payer: Self-pay

## 2024-04-09 NOTE — Telephone Encounter (Signed)
 Spoke with patient. Patient states that she finished Bactrim  DS on Satruday. Having ongoing constant lower abdominal pain and urinary frequency. Denies fever, chills, nausea, vomiting, or lower back pain. Reports her bleeding has stopped. Advised I will speak with Dr.Miller and return call. Patient verbalizes understanding.

## 2024-04-10 ENCOUNTER — Ambulatory Visit: Payer: Self-pay

## 2024-04-10 ENCOUNTER — Ambulatory Visit (INDEPENDENT_AMBULATORY_CARE_PROVIDER_SITE_OTHER): Admitting: Family Medicine

## 2024-04-10 VITALS — BP 130/80 | HR 88 | Temp 98.4°F | Wt 351.1 lb

## 2024-04-10 DIAGNOSIS — R300 Dysuria: Secondary | ICD-10-CM | POA: Diagnosis not present

## 2024-04-10 LAB — POC URINALSYSI DIPSTICK (AUTOMATED)
Bilirubin, UA: NEGATIVE
Blood, UA: POSITIVE
Glucose, UA: NEGATIVE
Ketones, UA: NEGATIVE
Nitrite, UA: NEGATIVE
Protein, UA: POSITIVE — AB
Spec Grav, UA: >=1.03 — AB
Urobilinogen, UA: 0.2 U/dL
pH, UA: 5.5

## 2024-04-10 MED ORDER — FLUCONAZOLE 150 MG PO TABS: 150.0000 mg | ORAL_TABLET | Freq: Once | ORAL | 0 refills | Status: AC

## 2024-04-10 MED ORDER — NITROFURANTOIN MONOHYD MACRO 100 MG PO CAPS: 100.0000 mg | ORAL_CAPSULE | Freq: Two times a day (BID) | ORAL | 0 refills | Status: AC

## 2024-04-10 NOTE — Telephone Encounter (Signed)
 Left message to call Benett Swoyer at [phone].  Calling to check in on patient. Patient was seen with PCP today for dysuria.

## 2024-04-10 NOTE — Progress Notes (Signed)
 "  Established Patient Office Visit  Subjective   Patient ID: Terri Cook, female    DOB: 29-Sep-2000  Age: 24 y.o. MRN: [id number]  Chief Complaint  Patient presents with   Dysuria    HPI    Terri Cook is seen as a work in with 2-week history of some urine frequency and suprapubic pressure and occasional burning with urination.  Denies any vaginal discharge.  She is sexually active but not over the past couple weeks and she has had 1 consistent partner.  Denies any fever.  She has had occasional nausea but no vomiting.  No flank pain.  She actually saw her GYN recently was placed on Septra  and urine culture on 12-29 came back negative.  Symptoms have progressed since then.  Past Medical History:  Diagnosis Date   Abnormal uterine bleeding    Acid reflux    Anemia    Asthma    Fatty liver    High blood pressure    High cholesterol    Ingrown left big toenail 09/2016   recurrent   PCOS (polycystic ovarian syndrome)    Pre-diabetes    Seasonal allergies    Past Surgical History:  Procedure Laterality Date   BIOPSY  05/10/2023   Procedure: BIOPSY;  Surgeon: Legrand Terri LITTIE DOUGLAS, MD;  Location: [facility];  Service: Gastroenterology;;   COLONOSCOPY WITH PROPOFOL  N/A 05/10/2023   Procedure: COLONOSCOPY WITH PROPOFOL ;  Surgeon: Legrand Terri LITTIE DOUGLAS, MD;  Location: [facility];  Service: Gastroenterology;  Laterality: N/A;   GALLBLADDER SURGERY  2021   LESION EXCISION Left 07/11/2014   Procedure: EXCISION OF BENIGN LESIONS INCLUDING MARGINS ;  Surgeon: Terri Millman, MD;  Location: [facility];  Service: Pediatrics;  Laterality: Left;  mid back  and right neck   TOENAIL EXCISION  01/27/2012   Procedure: MINOR TOENAIL EXCISION;  Surgeon: CHRISTELLA. Terri Millman, MD;  Location: [facility];  Service: Pediatrics;  Laterality: Left;   TOENAIL EXCISION  03/23/2012   Procedure: MINOR TOENAIL EXCISION;  Surgeon: CHRISTELLA. Terri Millman, MD;  Location: [facility];   Service: Pediatrics;  Laterality: Right;   TOENAIL EXCISION Right 09/21/2012   Procedure: Partial Excision of Right Toenail;  Surgeon: CHRISTELLA. Terri Millman, MD;  Location: [facility];  Service: Pediatrics;  Laterality: Right;   TOENAIL EXCISION Left 02/15/2013   Procedure: PARTIAL TOENAIL EXCISION ON THE LEFT BIG TOE AT THE OUTER LATERAL FOLD WITH PHENOL (MP ROOM) ;  Surgeon: CHRISTELLA. Terri Millman, MD;  Location: [facility];  Service: Pediatrics;  Laterality: Left;   TOENAIL EXCISION Left 08/16/2013   Procedure: PARTIAL EXCISION OF LEFT BIG TOE NAIL FROM OUTER LATERAL FOLD;  Surgeon: CHRISTELLA. Terri Millman, MD;  Location: [facility];  Service: Pediatrics;  Laterality: Left;   TOENAIL EXCISION Left 09/16/2016   Procedure: PARTIAL PERMANENT EXCISION OF LEFT INGROWN BIG TOENAIL;  Surgeon: Cook Julietta, MD;  Location: [facility];  Service: General;  Laterality: Left;   WISDOM TOOTH EXTRACTION  12/08/2018   all 4    reports that she has quit smoking. Her smoking use included cigarettes. She has a 0.5 pack-year smoking history. She has never used smokeless tobacco. She reports that she does not drink alcohol  and does not use drugs. family history includes Asthma in her father and mother; Cervical cancer in her maternal grandmother; Cirrhosis in her paternal grandmother; Diabetes in her father and paternal grandmother; High Cholesterol in her  father; Hypertension in her father; Obesity in her father and mother. Allergies[1]  Review of Systems  Constitutional:  Negative for chills and fever.  Gastrointestinal:  Negative for abdominal pain and vomiting.  Genitourinary:  Positive for dysuria and frequency. Negative for flank pain and hematuria.      Objective:     BP 130/80   Pulse 88   Temp 98.4 F (36.9 C) (Oral)   Wt (!) 351 lb 1.6 oz (159.3 kg)   LMP 03/29/2024 (Exact Date)   SpO2 97%   BMI 53.38 kg/m  BP Readings from Last 3  Encounters:  04/10/24 130/80  04/02/24 (!) 130/99  03/07/24 130/72   Wt Readings from Last 3 Encounters:  04/10/24 (!) 351 lb 1.6 oz (159.3 kg)  04/02/24 (!) 347 lb (157.4 kg)  03/07/24 (!) 352 lb 3.2 oz (159.8 kg)      Physical Exam Vitals reviewed.  Constitutional:      General: She is not in acute distress.    Appearance: She is not ill-appearing.  Cardiovascular:     Rate and Rhythm: Normal rate and regular rhythm.  Pulmonary:     Effort: Pulmonary effort is normal.     Breath sounds: Normal breath sounds. No wheezing or rales.  Neurological:     Mental Status: She is alert.      Results for orders placed or performed in visit on 04/10/24  POCT Urinalysis Dipstick (Automated)  Result Value Ref Range   Color, UA Yellow    Clarity, UA Cloudy    Glucose, UA Negative Negative   Bilirubin, UA Negative    Ketones, UA Negative    Spec Grav, UA >=1.030 (A) 1.010 - 1.025   Blood, UA Positive    pH, UA 5.5 5.0 - 8.0   Protein, UA Positive (A) Negative   Urobilinogen, UA 0.2 0.2 or 1.0 E.U./dL   Nitrite, UA Negative    Leukocytes, UA Small (1+) (A) Negative      The ASCVD Risk score (Arnett DK, et al., 2019) failed to calculate for the following reasons:   The 2019 ASCVD risk score is only valid for ages 45 to 39   * - Cholesterol units were assumed    Assessment & Plan:   Problem List Items Addressed This Visit   None Visit Diagnoses       Dysuria    -  Primary   Relevant Orders   POCT Urinalysis Dipstick (Automated) (Completed)   Urine Culture     Patient seen with almost 2-week history of intermittent dysuria and frequency.  Urine dipstick today shows positive leukocytes and blood.  Negative nitrites.  Patient nontoxic in appearance.  No recent fever.  Will send urine culture.  Cover with Macrobid  1 twice daily for 5 days pending culture results.  Stay well-hydrated.  She will consider over-the-counter Azo/Pyridium for acute dysuria symptoms.  Follow-up  promptly for any fever or worsening symptoms  No follow-ups on file.    [name], MD     [1]  Allergies Allergen Reactions   Clindamycin/Lincomycin Anaphylaxis    Trouble breathing?   "

## 2024-04-10 NOTE — Patient Instructions (Signed)
 Could also consider over the counter Pyridium or Azostandard.

## 2024-04-10 NOTE — Telephone Encounter (Signed)
 Patient is being seen 1/6

## 2024-04-10 NOTE — Telephone Encounter (Signed)
 FYI Only or Action Required?: FYI only for provider: appointment scheduled on 04/10/24.  Patient was last seen in primary care on 03/07/2024 by Terri Clotilda SAUNDERS, MD.  Called Nurse Triage reporting Dysuria.  Symptoms began x 2 weeks.  Interventions attempted: OTC medications: Ibuprofen, Tylenol  .  Symptoms are: unchanged.  Triage Disposition: See PCP Within 2 Weeks  Patient/caregiver understands and will follow disposition?: Yes                 Copied from CRM [id number]. Topic: Clinical - Red Word Triage >> Apr 10, 2024  1:34 PM Terri Cook wrote: [facility] that prompted transfer to Nurse Triage: lower abdominal pain, pain and burning with urination- worsening Reason for Disposition  All other urine symptoms  Answer Assessment - Initial Assessment Questions 1. SYMPTOM: What's the main symptom you're concerned about? (e.g., frequency, incontinence)     Pain, burning   2. ONSET: When did the  symptoms  start?     X 2 weeks ago   3. PAIN: Is there any pain? If Yes, ask: How bad is it? (Scale: 1-10; mild, moderate, severe)     Moderate   4. CAUSE: What do you think is causing the symptoms?     Possible UTI   5. OTHER SYMPTOMS: Do you have any other symptoms? (e.g., blood in urine, fever, flank pain, pain with urination)     No   6. PREGNANCY: Is there any chance you are pregnant? When was your last menstrual period?     No      Patient called in to triage with complaints of pain, burning with urination. Lower abdominal pain noted a s well. This has been ongoing for 2 weeks. The patient stated she was prescribed antibiotics on 12/29 by her OBGYN. She completed the full 5 days.  For home care, the patient is taking Ibuprofen, Tylenol .   Appointment scheduled for further evaluation; Patient agrees with the plan of care, and will reach out if symptoms worsen or persist.  Protocols used: Urinary Symptoms-A-AH

## 2024-04-11 LAB — URINE CULTURE
MICRO NUMBER:: 17431973
SPECIMEN QUALITY:: ADEQUATE

## 2024-04-12 ENCOUNTER — Ambulatory Visit: Payer: Self-pay | Admitting: Family Medicine

## 2024-04-16 NOTE — Telephone Encounter (Signed)
 Patient was seen with PCP 04/10/2024 for further evaluation.
